# Patient Record
Sex: Male | Born: 1945 | Race: White | Hispanic: No | Marital: Married | Smoking: Never smoker
Health system: Southern US, Community
[De-identification: ages and names within clinical notes are randomized; demographics above are authoritative.]

## PROBLEM LIST (undated history)

## (undated) DIAGNOSIS — K219 Gastro-esophageal reflux disease without esophagitis: Secondary | ICD-10-CM

## (undated) DIAGNOSIS — R0600 Dyspnea, unspecified: Secondary | ICD-10-CM

## (undated) DIAGNOSIS — M1990 Unspecified osteoarthritis, unspecified site: Secondary | ICD-10-CM

## (undated) DIAGNOSIS — F419 Anxiety disorder, unspecified: Secondary | ICD-10-CM

## (undated) DIAGNOSIS — F0390 Unspecified dementia without behavioral disturbance: Secondary | ICD-10-CM

## (undated) DIAGNOSIS — L97519 Non-pressure chronic ulcer of other part of right foot with unspecified severity: Secondary | ICD-10-CM

## (undated) DIAGNOSIS — F329 Major depressive disorder, single episode, unspecified: Secondary | ICD-10-CM

## (undated) DIAGNOSIS — G40909 Epilepsy, unspecified, not intractable, without status epilepticus: Secondary | ICD-10-CM

## (undated) DIAGNOSIS — K222 Esophageal obstruction: Secondary | ICD-10-CM

## (undated) DIAGNOSIS — M459 Ankylosing spondylitis of unspecified sites in spine: Secondary | ICD-10-CM

## (undated) DIAGNOSIS — K5730 Diverticulosis of large intestine without perforation or abscess without bleeding: Secondary | ICD-10-CM

## (undated) DIAGNOSIS — I739 Peripheral vascular disease, unspecified: Secondary | ICD-10-CM

## (undated) DIAGNOSIS — M349 Systemic sclerosis, unspecified: Secondary | ICD-10-CM

## (undated) DIAGNOSIS — L97529 Non-pressure chronic ulcer of other part of left foot with unspecified severity: Secondary | ICD-10-CM

## (undated) DIAGNOSIS — K449 Diaphragmatic hernia without obstruction or gangrene: Secondary | ICD-10-CM

## (undated) DIAGNOSIS — I509 Heart failure, unspecified: Secondary | ICD-10-CM

## (undated) DIAGNOSIS — M351 Other overlap syndromes: Secondary | ICD-10-CM

## (undated) DIAGNOSIS — I7300 Raynaud's syndrome without gangrene: Secondary | ICD-10-CM

## (undated) DIAGNOSIS — F32A Depression, unspecified: Secondary | ICD-10-CM

## (undated) DIAGNOSIS — R569 Unspecified convulsions: Secondary | ICD-10-CM

## (undated) DIAGNOSIS — M341 CR(E)ST syndrome: Secondary | ICD-10-CM

## (undated) DIAGNOSIS — K3184 Gastroparesis: Secondary | ICD-10-CM

## (undated) DIAGNOSIS — J690 Pneumonitis due to inhalation of food and vomit: Secondary | ICD-10-CM

## (undated) DIAGNOSIS — H269 Unspecified cataract: Secondary | ICD-10-CM

## (undated) DIAGNOSIS — K224 Dyskinesia of esophagus: Secondary | ICD-10-CM

## (undated) HISTORY — DX: Diverticulosis of large intestine without perforation or abscess without bleeding: K57.30

## (undated) HISTORY — DX: Gastroparesis: K31.84

## (undated) HISTORY — DX: Cr(e)st syndrome: M34.1

## (undated) HISTORY — DX: Esophageal obstruction: K22.2

## (undated) HISTORY — DX: Ankylosing spondylitis of unspecified sites in spine: M45.9

## (undated) HISTORY — DX: Systemic sclerosis, unspecified: M34.9

## (undated) HISTORY — PX: TOE SURGERY: SHX1073

## (undated) HISTORY — DX: Non-pressure chronic ulcer of other part of left foot with unspecified severity: L97.529

## (undated) HISTORY — PX: WISDOM TOOTH EXTRACTION: SHX21

## (undated) HISTORY — DX: Gastro-esophageal reflux disease without esophagitis: K21.9

## (undated) HISTORY — DX: Dyskinesia of esophagus: K22.4

## (undated) HISTORY — DX: Unspecified convulsions: R56.9

## (undated) HISTORY — DX: Unspecified dementia, unspecified severity, without behavioral disturbance, psychotic disturbance, mood disturbance, and anxiety: F03.90

## (undated) HISTORY — DX: Non-pressure chronic ulcer of other part of right foot with unspecified severity: L97.519

## (undated) HISTORY — DX: Epilepsy, unspecified, not intractable, without status epilepticus: G40.909

## (undated) HISTORY — DX: Unspecified osteoarthritis, unspecified site: M19.90

## (undated) HISTORY — DX: Other overlap syndromes: M35.1

## (undated) HISTORY — DX: Unspecified cataract: H26.9

## (undated) HISTORY — PX: THUMB ARTHROSCOPY: SHX2509

## (undated) HISTORY — DX: Raynaud's syndrome without gangrene: I73.00

## (undated) HISTORY — DX: Diaphragmatic hernia without obstruction or gangrene: K44.9

---

## 1999-01-26 ENCOUNTER — Ambulatory Visit (HOSPITAL_COMMUNITY): Admission: RE | Admit: 1999-01-26 | Discharge: 1999-01-26 | Payer: Self-pay | Admitting: Orthopedic Surgery

## 2000-01-26 ENCOUNTER — Ambulatory Visit (HOSPITAL_COMMUNITY): Admission: RE | Admit: 2000-01-26 | Discharge: 2000-01-26 | Payer: Self-pay

## 2000-09-03 ENCOUNTER — Encounter: Payer: Self-pay | Admitting: Gastroenterology

## 2000-09-30 ENCOUNTER — Other Ambulatory Visit: Admission: RE | Admit: 2000-09-30 | Discharge: 2000-09-30 | Payer: Self-pay | Admitting: Gastroenterology

## 2000-09-30 ENCOUNTER — Encounter (INDEPENDENT_AMBULATORY_CARE_PROVIDER_SITE_OTHER): Payer: Self-pay

## 2000-10-15 ENCOUNTER — Encounter: Payer: Self-pay | Admitting: Gastroenterology

## 2000-11-25 ENCOUNTER — Encounter: Payer: Self-pay | Admitting: Gastroenterology

## 2003-01-29 ENCOUNTER — Encounter: Payer: Self-pay | Admitting: Emergency Medicine

## 2003-01-29 ENCOUNTER — Inpatient Hospital Stay (HOSPITAL_COMMUNITY): Admission: EM | Admit: 2003-01-29 | Discharge: 2003-02-04 | Payer: Self-pay | Admitting: Emergency Medicine

## 2003-01-30 ENCOUNTER — Encounter: Payer: Self-pay | Admitting: Neurology

## 2003-01-31 ENCOUNTER — Encounter: Payer: Self-pay | Admitting: Internal Medicine

## 2003-02-01 ENCOUNTER — Encounter: Payer: Self-pay | Admitting: Internal Medicine

## 2003-04-09 ENCOUNTER — Observation Stay (HOSPITAL_COMMUNITY): Admission: EM | Admit: 2003-04-09 | Discharge: 2003-04-10 | Payer: Self-pay | Admitting: Emergency Medicine

## 2003-04-09 ENCOUNTER — Encounter: Payer: Self-pay | Admitting: Emergency Medicine

## 2003-07-13 ENCOUNTER — Encounter: Payer: Self-pay | Admitting: Internal Medicine

## 2003-07-13 ENCOUNTER — Ambulatory Visit (HOSPITAL_COMMUNITY): Admission: RE | Admit: 2003-07-13 | Discharge: 2003-07-13 | Payer: Self-pay | Admitting: Internal Medicine

## 2003-07-21 ENCOUNTER — Ambulatory Visit (HOSPITAL_COMMUNITY): Admission: RE | Admit: 2003-07-21 | Discharge: 2003-07-21 | Payer: Self-pay | Admitting: Otolaryngology

## 2003-11-20 HISTORY — PX: NOSE SURGERY: SHX723

## 2003-11-26 ENCOUNTER — Encounter: Payer: Self-pay | Admitting: Gastroenterology

## 2004-11-23 ENCOUNTER — Ambulatory Visit: Payer: Self-pay | Admitting: Gastroenterology

## 2005-01-01 ENCOUNTER — Ambulatory Visit: Payer: Self-pay | Admitting: Gastroenterology

## 2006-03-05 ENCOUNTER — Ambulatory Visit: Payer: Self-pay | Admitting: Gastroenterology

## 2007-04-03 ENCOUNTER — Ambulatory Visit: Payer: Self-pay | Admitting: Gastroenterology

## 2007-08-05 ENCOUNTER — Emergency Department (HOSPITAL_COMMUNITY): Admission: EM | Admit: 2007-08-05 | Discharge: 2007-08-06 | Payer: Self-pay | Admitting: Emergency Medicine

## 2008-02-06 ENCOUNTER — Ambulatory Visit: Payer: Self-pay | Admitting: Gastroenterology

## 2009-03-07 DIAGNOSIS — K449 Diaphragmatic hernia without obstruction or gangrene: Secondary | ICD-10-CM | POA: Insufficient documentation

## 2009-03-07 DIAGNOSIS — K3184 Gastroparesis: Secondary | ICD-10-CM | POA: Insufficient documentation

## 2009-03-07 DIAGNOSIS — F068 Other specified mental disorders due to known physiological condition: Secondary | ICD-10-CM

## 2009-03-07 DIAGNOSIS — M349 Systemic sclerosis, unspecified: Secondary | ICD-10-CM

## 2009-03-07 DIAGNOSIS — K5730 Diverticulosis of large intestine without perforation or abscess without bleeding: Secondary | ICD-10-CM | POA: Insufficient documentation

## 2009-03-07 DIAGNOSIS — M359 Systemic involvement of connective tissue, unspecified: Secondary | ICD-10-CM | POA: Insufficient documentation

## 2009-03-07 DIAGNOSIS — K224 Dyskinesia of esophagus: Secondary | ICD-10-CM

## 2009-03-07 DIAGNOSIS — I7300 Raynaud's syndrome without gangrene: Secondary | ICD-10-CM | POA: Insufficient documentation

## 2009-03-07 DIAGNOSIS — K222 Esophageal obstruction: Secondary | ICD-10-CM

## 2009-03-08 ENCOUNTER — Ambulatory Visit: Payer: Self-pay | Admitting: Gastroenterology

## 2009-03-08 DIAGNOSIS — K219 Gastro-esophageal reflux disease without esophagitis: Secondary | ICD-10-CM | POA: Insufficient documentation

## 2009-07-14 ENCOUNTER — Ambulatory Visit (HOSPITAL_COMMUNITY): Admission: RE | Admit: 2009-07-14 | Discharge: 2009-07-14 | Payer: Self-pay | Admitting: Internal Medicine

## 2010-11-10 ENCOUNTER — Telehealth: Payer: Self-pay | Admitting: Gastroenterology

## 2010-12-01 ENCOUNTER — Ambulatory Visit
Admission: RE | Admit: 2010-12-01 | Discharge: 2010-12-01 | Payer: Self-pay | Source: Home / Self Care | Attending: Gastroenterology | Admitting: Gastroenterology

## 2010-12-21 NOTE — Progress Notes (Signed)
Summary: Medication   Phone Note Call from Patient Call back at Home Phone [phone]   Caller: Patient Call For: Dr. Jarold Motto Reason for Call: Talk to Nurse Summary of Call: Patients refill was denied because he needs an appt and he wants to discuss this with someone because it will be a while before he can come in to be seen and he will be out of medication by then Initial call taken by: [place] Johnson,  November 10, 2010 9:30 AM  Follow-up for Phone Call        pt made an appt for 12/01/2010, advised pt if he does not keep his appt we will no longer be able to refill his Regaln.  Follow-up by: Harlow Mares CMA Duncan Dull),  November 10, 2010 10:03 AM    Prescriptions: REGLAN 5 MG/ML SOLN (METOCLOPRAMIDE HCL) take 1/2 teaspoon by mouth two times a day...MUST HAVE OFFICE VISIT  #480 x 0   Entered by:   Harlow Mares CMA (AAMA)   Authorized by:   Mardella Layman MD [facility]   Signed by:   Harlow Mares CMA (AAMA) on 11/10/2010   Method used:   Electronically to        [facility] (retail)       [address]       [place], [place]  [address]       Ph: [phone]       Fax: [phone]   RxID:   [id number]

## 2010-12-21 NOTE — Assessment & Plan Note (Signed)
Summary: GASTROPARESIS follow up/lk    History of Present Illness Visit Type: Follow-up Visit Primary GI MD: Sheryn Bison MD FACP FAGA Primary Provider: Carylon Perches, MD Requesting Provider: na Chief Complaint: F/u for gastroparesis. Pt states that he is feeling better but still having problems with GERD  History of Present Illness:   Robert Carey is completely asymptomatic on his current regime of AcipHex q.a.m., p.r.n. omeprazole and Reglan. He has severe Raynaud phenomenon and chronic dementia. He denies dysphagia, hepatobiliary complaints, a lower GI problems. Is followed closely by Dr. Carylon Perches with regular labs that apparently have been normal. He denies any side effects from Reglan therapy. His appetite is good his weight is stable, and his wife relates that his dementia is stable.   GI Review of Systems    Reports acid reflux and  heartburn.      Denies abdominal pain, belching, bloating, chest pain, dysphagia with liquids, dysphagia with solids, loss of appetite, nausea, vomiting, vomiting blood, weight loss, and  weight gain.        Denies anal fissure, black tarry stools, change in bowel habit, constipation, diarrhea, diverticulosis, fecal incontinence, heme positive stool, hemorrhoids, irritable bowel syndrome, jaundice, light color stool, liver problems, rectal bleeding, and  rectal pain.    Current Medications (verified): 1)  Reglan 5 Mg/ml Soln (Metoclopramide Hcl) .... Take 1/2 Teaspoon By Mouth Two Times A Day...must Have Office Visit 2)  Aciphex 20 Mg  Tbec (Rabeprazole Sodium) .... Take 1 Each Day 30 Minutes Before Meals 3)  Prednisone 5 Mg Tabs (Prednisone) .... Once Daily 4)  Prilosec Otc 20 Mg Tbec (Omeprazole Magnesium) .... Once Daily 5)  Nifedipine Cr Osmotic 60 Mg Xr24h-Tab (Nifedipine) .... Once Daily 6)  Keppra 750 Mg Tabs (Levetiracetam) .... Take 1 1/2 Tablets Two Times A Day 7)  Calcitrate/vitamin D 315-200 Mg-Unit Tabs (Calcium Citrate-Vitamin D) .... Take 4  Caplets Daily 8)  Aspirin 81 Mg Tbec (Aspirin) .... Once Daily 9)  Multivitamins  Tabs (Multiple Vitamin) .... Once Daily 10)  Boniva 150 Mg Tabs (Ibandronate Sodium) .... Take One Tablet Monthly  Allergies (verified): 1)  ! Dilantin  Past History:  Past medical, surgical, family and social histories (including risk factors) reviewed for relevance to current acute and chronic problems.  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 Toe surgery Thumb surgery Dental implant  Family History: Reviewed history from 03/08/2009 and no changes required. Family History of Breast Cancer:Mother Family History of Pancreatic Cancer:Mother Family History of Diabetes: Mother Family History of Heart Disease: Both Parents No FH of Colon Cancer:  Social History: Reviewed history from 03/08/2009 and no changes required. Occupation: retired Patient has never smoked.  Alcohol Use - no Daily Caffeine Use -1 Illicit Drug Use - no  Vital Signs:  Patient profile:   65 year old male Height:      71 inches Weight:      155 pounds BMI:     21.70 BSA:     1.89 Pulse rate:   64 / minute Pulse rhythm:   regular BP sitting:   128 / 76  (left arm) Cuff size:   regular  Vitals Entered By: Ok Anis CMA (December 01, 2010 9:01 AM)  Physical Exam  General:  Well developed, well nourished, no acute distress.healthy appearing.  Head:  Normocephalic and atraumatic. Eyes:  PERRLA, no icterus. Lungs:  Clear throughout to auscultation. Heart:  Regular rate and rhythm; no murmurs, rubs,  or bruits. Abdomen:  Soft, nontender and nondistended. No masses,  hepatosplenomegaly or hernias noted. Normal bowel sounds. Pulses:  cyanosis of his hands with severe Raynaud's phenomenon. His hands are not painful or swollen. Neurologic:  Alert and  oriented x4;  grossly normal neurologically. Psych:  Alert and cooperative. Normal mood and affect.poor memory.     Impression & Recommendations:  Problem # 1:  GERD (ICD-530.81) Assessment Improved Continue Dexilant 60 mg q.a.m. and omeprazole 20 mg before bedtime with p.r.n. Carafate suspension and p.r.n. Reglan use. I do not think this patient needs repeat endoscopy at this time.  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 He is on nifedipine XR 60 mg a day.  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  3)  Please schedule a follow-up appointment in 1 year. 4)  The medication list was reviewed and reconciled.  All changed / newly prescribed medications were explained.  A complete medication list was provided to the patient / caregiver. 5)  Avoid foods high in acid content ( tomatoes, citrus juices, spicy foods) . Avoid eating within 3 to 4 hours of lying down or before exercising. Do not over eat; try smaller more frequent meals. Elevate head of bed four inches when sleeping.  Prescriptions: ACIPHEX 20 MG  TBEC (RABEPRAZOLE SODIUM) Take 1 each day 30 minutes before meals  #30 Each x 12   Entered by:   Harlow Mares CMA (AAMA)   Authorized by:   Mardella Layman MD [facility]   Signed by:   Harlow Mares CMA (AAMA) on 12/01/2010   Method used:   Electronically to        [facility] (retail)       [address]       [place], [place]  [address]       Ph: [phone]        Fax: [phone]   RxID:   [id number] REGLAN 5 MG/ML SOLN (METOCLOPRAMIDE HCL) take 1/2 teaspoon by mouth two times a day...MUST HAVE OFFICE VISIT  #480 x 12   Entered by:   Harlow Mares CMA (AAMA)   Authorized by:   Mardella Layman MD [facility]   Signed by:   Harlow Mares CMA (AAMA) on 12/01/2010   Method used:   Electronically to        [facility] (retail)       [address]       [place], [place]  [address]       Ph: [phone]       Fax: [phone]   RxID:   [id number]

## 2011-04-03 NOTE — Assessment & Plan Note (Signed)
[facility]                         GASTROENTEROLOGY OFFICE NOTE   Robert Carey, Yono DOSS CYBULSKI                   MRN:          [id number]  DATE:02/06/2008                            DOB:          11/18/1946    Mr. Cuartas is a 65 year old white male, retired attorney with associated  seizure disorder and progressive dementia which is followed by Dr. Doreatha Martin  at [facility].  He apparently had recent MRI at that institution.  The results are unclear at this time.   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  He has had numerous  endoscopies and dilatations.  He also has had gastrointestinal motility  evaluation at [facility] with Dr. Alycia Rossetti.  In the patient in the  past was on regular doses of Reglan and developed tremor and drowsiness,  and his dosage has gradually been decreased so that he is currently  taking 2.5 mL twice a day before breakfast and supper.  He takes Aciphex  20 mg before supper and Prilosec 20 mg in the morning.  He recurrently  denies reflux symptoms, dysphagia, nausea, vomiting, or abdominal pain.  He occasionally has some diarrhea when he eats too much fructose, but  otherwise his bowels are moving well.  He has actually gained 10 pounds  since he was last seen in May of 2008.   He sees Dr. Orson Ape for his mixed connective tissue disease and is on  prednisone 5 mg a day, aspirin 81 mg a day, calcium and multivitamins,  Nifedipine 60 mg a day for rather severe Raynaud's phenomenon, Boniva  150 mg monthly, and Keppra 1-25 twice a day.  In the past had urticaria  with Dilantin use.   PHYSICAL EXAMINATION:  The patient is awake and alert, in no acute  distress, and oriented x3, although his short term memory is very poor.  He weighs 159 pounds, with blood pressure of 124/80.  Pulse was  76 and  regular.  General physical examination was not performed.   ASSESSMENT:  1. Chronic gastroesophageal reflux disease with associated      gastroparesis and esophageal motility disorder, doing well on      prokinetic agents and rather marked chronic acid suppressive      therapy.  He certainly has no current symptoms suggestive of a  recurrent peptic stricture of his esophagus, bezoar formation, etc.  In  fact the patient is gaining weight and doing well in both his mind and  his wife's opinion.  1. Intermittent diarrhea which sounds like excessive fructose use and      periodic malabsorption.  2. Severe Raynaud's phenomenon with extremely blue, cold digits on      exam.  This is managed by Dr. Rande Lawman.  3. Negative screening colonoscopy exam completed in February 2006.  4. Chronic dementia of questionable etiology with associated seizure      disorder.   RECOMMENDATIONS:  1. Renew all medications as listed above.  2. Trial of p.r.n. Align probiotic therapy.  3. Warnings of excessive sorbitol and fructose.  4. Yearly follow up, GI-wise, unless otherwise indicated.     Vania Rea. Jarold Motto, MD, Caleen Essex, FAGA  Electronically Signed    DRP/MedQ  DD: 02/06/2008  DT: 02/06/2008  Job #: [id number]   cc:   Rande Lawman, MD  Monia Sabal, MD  Dr. Jess Barters. Ouida Sills, MD

## 2011-04-06 NOTE — Discharge Summary (Signed)
NAME:  Robert Carey, Robert Carey                      ACCOUNT NO.:  [id number]   MEDICAL RECORD NO.:  [id number]                   PATIENT TYPE:  INP   LOCATION:  IC10                                 FACILITY:  [facility]   PHYSICIAN:  Kingsley Callander. Ouida Sills, M.D.                  DATE OF BIRTH:  1946-08-14   DATE OF ADMISSION:  01/29/2003  DATE OF DISCHARGE:  02/04/2003                                 DISCHARGE SUMMARY   DISCHARGE DIAGNOSES:  1. Complex partial seizures.  2. Mixed connective tissue disease.  3. Small vessel ischemic changes by MRI.  4. Papular back rash likely due to Unasyn.   PROCEDURES:  1. Head CT x2.  2. MRI of the brain.  3. EEG x2.  4. Lumbar puncture.  5. Echocardiogram.  6. Carotid duplex scan.   HOSPITAL COURSE:  This patient is a 65 year old white male who presented to  the emergency room with a change in mental status.  His thinking had been  unclear.  He exhibited marked memory deficits.  There had been no witnessed  seizures.  He had not experienced any known head trauma.  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  He was  hospitalized and Robert Carey, M.D. from neurology was consulted.  The  next day he experienced a generalized tonic-clonic type seizure.  A repeat  CT scan with contrast revealed no change.  He was again felt to have a  subacute infarct.  Following his seizure episode he developed fever.  Blood  cultures were negative.  His chest x-ray revealed no definite evidence of  aspiration.  He was treated with IV Unasyn.  He was treated with IV Dilantin  following his seizure and transferred to the intensive care unit.  His  Dilantin level was 16 following his initial therapy.   He underwent a lumbar puncture by Robert Carey, M.D.  Had an opening  pressure 17 cm water.  His protein was 104, glucose 86.  No cells were  identified.  His cryptococcal antigen was negative.  His herpes simplex PCR  was  negative.  His culture has been negative.   He underwent an EEG which showed frequent epileptiform discharges in the  right temporal region.  He was started on IV Solu-Medrol in light of his  history of connective tissue disease.  Robert Carey, M.D. discussed this  with Robert Carey, M.D., his rheumatologist in [place].   He experienced multiple complex partial seizure type episodes with lip  smacking and staring.  His fevers resolved.  He developed a reddened papular  rash on his back which was felt to likely be related to Unasyn.  Unasyn was  stopped.   He underwent an MRI of the brain which revealed small vessel ischemic  changes with no evidence of infarct.  Depakote was added.  His carotid  ultrasound revealed no evidence of hemodynamically significant stenosis.  His echocardiogram revealed no evidence of thrombus.   By March 17 he continued to show signs of memory deficits with intermittent  seizure activity despite a Dilantin level of 17.2 and a valproic acid of  40.6.  He was given additional boluses of valproic acid.  On March 18 he had  approximately 19 episodes documented by the nursing staff throughout the  day.  His valproic acid level was boosted to 68 with additional boluses.   Due to his recurrent seizure activity, arrangements have been made for  transfer to [facility] for further neurology evaluation and treatment.   DISCHARGE MEDICATIONS:  1. Depakote 500 mg t.i.d.  2. Dilantin 400 mg daily.  3. Solu-Medrol 40 mg IV q.8h.   Other laboratory studies had revealed a sedimentation rate of 65.  His white  counts were consistently normal.                                               Kingsley Callander. Ouida Sills, M.D.    ROF/MEDQ  D:  02/04/2003  T:  02/05/2003  Job:  [id number]   cc:   Darleen Crocker A. Gerilyn Carey, M.D.  [address], [place]  [place]  [place] [address]  Fax: [phone]

## 2011-04-06 NOTE — Group Therapy Note (Signed)
   NAME:  Robert Carey, Robert Carey                      ACCOUNT NO.:  [id number]   MEDICAL RECORD NO.:  [id number]                   PATIENT TYPE:  INP   LOCATION:  IC10                                 FACILITY:  [facility]   PHYSICIAN:  [name], M.D.              DATE OF BIRTH:  22-Jan-1946   DATE OF PROCEDURE:  DATE OF DISCHARGE:                                   PROGRESS NOTE   SUBJECTIVE:  The patient had a witnessed generalized tonic-clonic seizure.  Today, he had some breathing difficulties during and after this.  Before the  seizure, he has continued to have significant short-term memory impairment.  He does not complain of any headaches.   OBJECTIVE:  VITAL SIGNS:  Temperature maximum 101.5 at 3-4 hours after his  seizure, blood pressure 140/90, pulse 83, respirations 15.  GENERAL:  The patient lays in bed with eyes closed. He opens his eyes to  light sternal rub.  He talks in simple sentences, reports that he is drowsy  but denies having headaches.  HEENT: __________ bilaterally.  Pupils are reactive.  Extraocular movements  are full.  NECK:  Seems somewhat stiff.   CT scan was reviewed in person. Once again, there is a hypodensity seen in  the left parietal region. It is unchanged from the prior scan done  yesterday. This scan today is done with IV contrast, however, there is no  enhancement seen.   ASSESSMENT/PLAN:  Continued encephalopathy now with fever and seizures.  Concerns are raised for potential encephalitis or meningitis.   Given the CT scan does not show an enhancement lesion, I think the chances  of a high grade malignancy or abscess are very low.  Potential differential  includes a subacute infarct several months ago, now presenting with seizures  causing alteration of consciousness. However, given the fever, I am  concerned with potential encephalitis or meningitis, particularly chronic  meningitis from Cryptococcosis and also possible herpes meningitis.   We agree with the current Dilantin that has been started. He is to have an  EEG done this evening or tomorrow morning. Will check a Dilantin level, will  continue with the Dilantin maintenance dose. Given the fever, I think we  ought to access his spinal fluid to provide for any evidence of meningitis,  encephalitis.                                                [name], M.D.    KAD/MEDQ  D:  01/30/2003  T:  01/30/2003  Job:  [id number]

## 2011-04-06 NOTE — Discharge Summary (Signed)
   NAME:  Robert Carey, Robert Carey NO.:  [id number]   MEDICAL RECORD NO.:  [id number]                   PATIENT TYPE:  OBV   LOCATION:  A341                                 FACILITY:  [facility]   PHYSICIAN:  Kingsley Callander. Ouida Sills, M.D.                  DATE OF BIRTH:  09-24-1946   DATE OF ADMISSION:  04/09/2003  DATE OF DISCHARGE:  04/10/2003                                 DISCHARGE SUMMARY   DISCHARGE DIAGNOSES:  1. Seizure.  2. History of postviral vasculitis.  3. Mixed connective tissue disease.  4. Raynaud's phenomena.   HOSPITAL COURSE:  This patient is a 65 year old white male who presented  with a seizure at home.  He had undergone an EEG at [facility] that morning. He has been continued on Keppra and Depakote for  seizure prevention.  His valproic acid level was 66.  His sodium 132,  potassium 4.7, calcium is 8.5.  White count 4.4.  There was no fever.  His  had a postictal period and then returned to his baseline mental status. He  was hospitalized overnight for observation. He had frequent neurologic  checks. He remained stable and was felt to be in satisfactory condition for  discharge on the morning of 05/22.  He will follow up with Dr. Luellen Pucker in the  neurology department at [facility]   DISCHARGE MEDICATIONS:  1. Keppra 750 mg b.i.d.  2. Depakote 1000 mg t.i.d.  3. Aspirin 81 mg daily.  4. Multivitamin daily.  5. Procardia XL 60 mg daily  6. Prednisone 5 mg daily.  7. Tolectin 400 mg b.i.d.  8. Vicodin q.4h. p.r.n.  9. Protonix 40 mg daily.  10.      Caltrate q.i.d.  11.      Zocor 20 mg daily.                                               Kingsley Callander. Ouida Sills, M.D.    ROF/MEDQ  D:  04/10/2003  T:  04/10/2003  Job:  [id number]

## 2011-04-06 NOTE — Group Therapy Note (Signed)
   NAME:  Robert Carey, Robert Carey                      ACCOUNT NO.:  [id number]   MEDICAL RECORD NO.:  [id number]                   PATIENT TYPE:  INP   LOCATION:  IC10                                 FACILITY:  [facility]   PHYSICIAN:  [name], M.D.              DATE OF BIRTH:  [birth year]   DATE OF PROCEDURE:  02/02/2003  DATE OF DISCHARGE:                                   PROGRESS NOTE   SUBJECTIVE:  The patient has had a better day today, was initially somewhat  drowsy this morning but was up sitting and conversing today.  He still has  had three or four brief staring spells but these are a lot less than prior  events yesterday.  Apparently they were not as long.   OBJECTIVE:  He has been afebrile today (T-max 99.2), blood pressure 133/81,  pulse 86, respirations 18.  He is awake and alert and converses.  He  actually did have one of his spells while I was present in the room.  The  nurses report that he seemed to have these spells when he is trying to  urinate.  He developed the acute onset of staring with chewing-like  movements of his mouth.  Entire event lasted about 1 minute.  His EEG has  normalized today which I think is a good sign that there was no epileptiform  discharges seen.  His Dilantin level has dropped to 13.  Sed rate 65.   ASSESSMENT AND PLAN:  Melton Krebs with complex partial seizures that seemed to  have improved.  His EEGs have also improved but he continues to have these  smaller type of complex partial seizures.  We are therefore going to give  him additional Dilantin and we will increase the maintenance dose to about  400-450 a day.  That is to be given IV 1 mL q.8h.  We will also give him a  bolus of 500 of Dilantin.  Depakote has been started.  We will check his  level in the morning.  We will also check a Dilantin level.                                               [name], M.D.    KAD/MEDQ  D:  02/02/2003  T:  02/02/2003  Job:  [id number]

## 2011-04-06 NOTE — Consult Note (Signed)
NAME:  Robert Carey, Robert Carey                      ACCOUNT NO.:  [id number]   MEDICAL RECORD NO.:  [id number]                   PATIENT TYPE:  INP   LOCATION:  A202                                 FACILITY:  [facility]   PHYSICIAN:  [name], M.D.              DATE OF BIRTH:  February 04, 1946   DATE OF CONSULTATION:  DATE OF DISCHARGE:                                   CONSULTATION   NEUROLOGICAL CONSULTATION:   IMPRESSION:  The patient's presentation and imaging seems to suggest an  infectious process or possibly even a tumor.  Statistically speaking,  however, an acute infarct is most likely.   RECOMMENDATIONS:  As an MRI is not going to be available until Monday, I  would suggest that a CT scan of the brain with contrast be carried out.  Additional recommendations include electroencephalography and q.4h.  neurologic checks.  We may consider giving him a stress dose of steroids as  he is on chronic steroid medications.  Other suggestions include an  echocardiography.   HISTORY OF PRESENT ILLNESS:  This is a 65 year old right-handed Caucasian  male who is a [employer] fast [employer].  He probably has not felt well this past week.  The family reports that he has had upper respiratory symptoms.  He  apparently has not had  a good appetite all week long.  He was otherwise  unremarkable without cognitive impairment and had been functioning well.  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  At work today, he was noted to be much quieter than usual.  On  questioning his colleagues noted that he had marked memory impairment.  He  was subsequently taken to the hospital for further evaluation where a CT  scan apparently showed evidence of a left parietal infarct.   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  There is no  history of hypertension, diabetes, or heart disease.   MEDICATIONS:  The admission medications include prednisone.  He also is  listed on Procardia XL 60 mg once a day, Tolectin DS 40 mg t.i.d.   FAMILY HISTORY:  Significant with a mother with heart disease.  There is a  history of diabetes.   REVIEW OF SYSTEMS:  Review of systems are as stated above.  No reports of  headaches or fevers.   PHYSICAL EXAMINATION:  GENERAL:  Physical examination showed a thin  gentleman who is drowsy but easily arousable.  He appears about 10 years  older than his stated age.  NECK:  Neck is supple.  VITAL SIGNS:  Blood pressure 143/76, pulse 91, respirations 20, temperature  97.8.  NEUROLOGICAL:  The patient, as stated before, is drowsy but arouses easily.  He is slightly uncooperative with the evaluation.  He has marked word  finding difficulties and difficulty with  memory.  Naming was relatively  intact.  Comprehension is fine.  He has somewhat also slightly reduced  fluency.  No clear neglecta seen.  He is essentially oriented x1.  Apparently, he has been oriented x3 before.  He had difficulty stating the  name of the president, which city he is in.  Cranial nerve evaluation with  pupils full, and briskly reactive.  Visual fields are intact.  Extraocular  muscles full.  Facial muscle strength is normal and symmetric.  Tongue is  midline.  Uvula is midline.  He also has normal motor examination that shows  normal tone, bulk and strength.  There is no pronator drift.  Five finger  movements are normal.  Foot tap is also normal.  Reflexes are +2.  Plantars  are downgoing.  Sensory examination is normal to temperature and light  touch.  Coordination is unremarkable.  Gait is normal.   LABORATORY DATA:  Review of the head CT scan shows subtle hypodensities  involved in the parietal region of the left.  This hypodensity, however, is  in the subcortical region without mass effect.  The gray matter in the  surrounding region appears to be relatively  spare.                                               [name], M.D.    KAD/MEDQ  D:  01/29/2003  T:  01/30/2003  Job:  [id number]

## 2011-04-06 NOTE — Group Therapy Note (Signed)
   NAME:  Robert Carey, SOUTHERS NO.:  [id number]   MEDICAL RECORD NO.:  [id number]                  PATIENT TYPE:   LOCATION:                                       FACILITY:   PHYSICIAN:  [name], M.D.              DATE OF BIRTH:   DATE OF PROCEDURE:  DATE OF DISCHARGE:                                   PROGRESS NOTE   SUBJECTIVE:  The patient apparently is more lucid and responsive today.  He,  however, had a seizure at about 12 midnight, early this morning, and he was  given a dose of Ativan.  He apparently had a spiking temperature today.   OBJECTIVE:  The T-max is 103.2.  He had a temperature before of 101.  His  blood pressure is 120/73, pulse of 83.  Respirations 20-24.  He opens his  eyes to verbal command and follows commands.  Pupils are 5 mm and briskly  active.  Extraocular movements are full.  He is able to state that he is in  the hospital at [facility].  He knows the year.  He knows that he has 2  daughters and knows that it is Sunday.  He was also able to take pieces of  ice.  His __________ results indicates that the WBC and RBC are both 0,  glucose 86, protein 109, gram stain negative.  CSF culture and blood  cultures are also pending at this time. Dilantin level this morning was 14,  and yesterday, before the seizure was 16.  EEG showed that he had frequent  epileptiform discharges in the right temporal region T4 during photic  stimulation. No clear ongoing electro __________ seizures; however.   ASSESSMENT AND PLAN:  An encephalopathy with recurrent fevers and seizure.  The family reports that he has had unexplained fevers in the past that were  worked up and were unremarkable.  He does have a baseline history of mixed  connective tissue disease which may be playing a role.  Given his recurrent  seizure we will give him another bolus of Dilantin 300 mg and also increase  and maintain his dose to 125 mg q.8h.  We may consider a  stress dose of high dose Solu-Medrol given that he is on  chronic steroids.  His MRI is scheduled for tomorrow. We will also order  some other blood testing including TSH, B12, and RPR.                                               [name], M.D.    KAD/MEDQ  D:  01/31/2003  T:  01/31/2003  Job:  [id number]

## 2011-04-06 NOTE — H&P (Signed)
NAME:  Robert Carey, Robert Carey NO.:  [id number]   MEDICAL RECORD NO.:  [id number]                   PATIENT TYPE:  INP   LOCATION:  A202                                 FACILITY:  [facility]   PHYSICIAN:  Kingsley Callander. Ouida Sills, M.D.                  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  His wife, daughter, and sister are with him.  He has exhibited  unclear thinking patterns today. He was unable to remember having gone home  from work for lunch. He had difficulty remembering the President.  He was  unclear on recent events. There had been no head trauma, fever, weakness or  change in speech.  He generally had not quite felt himself since a  gastroenteritis episode a week ago.  He was noted to not be interacting with  his office staff as usual this afternoon. He was much more quiet than usual.  He has no past history of neurologic problems.   PAST MEDICAL HISTORY:  1. Ankylosing spondylitis and mixed connective tissue disease.  2. Osteoporosis.  3. Toe surgery.  4. Esophageal stricture dilatations.   MEDICATIONS:  1. Prednisone 5 mg daily.  2. Procardia XL 60 mg daily.  3. Tolectin DS 400 mg t.i.d.  4. Citrucel q.i.d.   ALLERGIES:  None.   ALLERGIES:  He does not smoke cigarettes or drink alcohol.   FAMILY HISTORY:  His father had coronary heart disease and chronic lung  disease.  His mother has had coronary heart disease.  A sister has melanoma.   REVIEW OF SYSTEMS:  No headache, fever, chest pain, difficulty in breathing,  abdominal pain, change in bowel habits, or difficulty voiding.  He has  Raynaud phenomenon, and has chronic arthritic complaints.   PHYSICAL EXAMINATION:  VITAL SIGNS:  Temperature 97.8,  pulse 78,  respirations 18, blood pressure 145/78.  MENTAL STATUS:  Alert and oriented day of the week, but not the day of the  month.  He is oriented to the year, but has difficulty explaining the  situation here.  HEENT:  Fundi appear normal.  Pharynx is unremarkable.  NECK:  There is no JVD or thyromegaly.  No carotid bruits.  LUNGS:  Clear.  HEART:  Regular with no murmurs.  ABDOMEN:  Nontender with no hepatosplenomegaly.  BACK:  He has some scoliosis and straightening of the spine.  He has  multiple seborrheic keratoses on his back.  EXTREMITIES:  He has purplish discoloration of his hands from Raynaud.  There are no other cyanosis, clubbing, or edema.  NEUROLOGIC:  He is alert. His speech is normal.  His calculations are  normal.  He had some difficulty remembering his phone number. He had  difficulty naming any Presidents prior to [employer] except for [employer].  He is  able to state the months of the year backward and spell world backward.  He has no arm or leg weakness.  He has withdrawal responses with plantar  testing. He has brisk reflexes.  Strength is full.   LABORATORY DATA:  White count 6000, hematocrit 40.8, platelets 248.  Sodium  133, potassium 4.3, glucose 154, BUN 17, creatinine 1.0, calcium 9.4.  His  chest x-ray is negative.  Head CT reveals a subacute infarct in the left  parietal lobe.   IMPRESSION:  1. Left parietal stroke.  The precise time of onset is uncertain.  This     would make him a poor candidate for thrombolytic therapy.  He will be     hospitalized and treated with antiplatelet therapy with aspirin and     Plavix.  Will obtain a neurology consultation with Dr. Gerilyn Pilgrim.  An MRI     of the brain will be obtained.  2. Ankylosing spondylitis/mixed connective tissue disease.  Continue his     doses of prednisone and Tolectin.                                               Kingsley Callander. Ouida Sills, M.D.    ROF/MEDQ  D:  01/29/2003  T:  01/29/2003  Job:  [id number]    cc:   Darleen Crocker A. Gerilyn Pilgrim, M.D.  [address], [place]  [place]  [place] [address]  Fax: [phone]

## 2011-04-06 NOTE — Procedures (Signed)
   NAME:  FATE, CASTER NO.:  [id number]   MEDICAL RECORD NO.:  [id number]                  PATIENT TYPE:   LOCATION:  ICU10                                FACILITY:   PHYSICIAN:  [name], M.D.              DATE OF BIRTH:   DATE OF PROCEDURE:  DATE OF DISCHARGE:                                EEG INTERPRETATION   HISTORY:  This is a 65 year old who presents with altered mental status and  seizures.   ANALYSIS:  This is a 16 channel recording which is conducted for  approximately 20 minutes.  There is a Beta posterior rhythm of approximately  20-21 Hz.  The recording seems to be somewhat replete with Beta activity.  There is faster Beta activity in the frontal fields.  Awake and drowsy  activities are seen.  During photic stimulation there is rhythmic delta  activity seen initially starting in the frontal but involves over the entire  hemisphere bilaterally.  During the middle part of photic stimulation there  is seen phase reversal at T4 occurring at a rate of about 1-1/2 per Hz  during photic stimulation.  During this time the patient apparently did not  respond to verbal commands.  This spell was __________ throughout photic  stimulation.  Both the global rhythmic 2-3 Hz, delta slowing; and the phase  reversal seen at T4, stopped abruptly after photic stimulation without any  slowing afterwards.  Essentially the baseline returned to normal afterwards.  No hyperventilation was conducted.   IMPRESSION:  This is an abnormal recording due to epileptiform activity seen  in the right temporal area induced by photic stimulation.                                               [name], M.D.    KAD/MEDQ  D:  01/31/2003  T:  01/31/2003  Job:  [id number]

## 2011-04-06 NOTE — Op Note (Signed)
   NAME:  Robert Carey, Robert Carey                      ACCOUNT NO.:  [id number]   MEDICAL RECORD NO.:  [id number]                   PATIENT TYPE:  OIB   LOCATION:  2899                                 FACILITY:  [facility]   PHYSICIAN:  Jefry H. Pollyann Kennedy, M.D.                DATE OF BIRTH:  12-29-1945   DATE OF PROCEDURE:  07/21/2003  DATE OF DISCHARGE:                                 OPERATIVE REPORT   PREOPERATIVE DIAGNOSIS:  Nasal fracture with displacement.   POSTOPERATIVE DIAGNOSIS:  Nasal fracture with displacement.   PROCEDURE:  Closed reduction of nasal fracture with stabilization.   SURGEON:  Jefry H. Pollyann Kennedy, M.D.   Local anesthesia was used with intravenous sedation.  The patient tolerated  the procedure well and was transferred to recovery in stable condition.   HISTORY:  He is a 65 year old gentleman who fell and hit his nose about a  week ago and has a severe displacement of the nasal bones toward the right  side.  The risks, benefits, alternatives, and complications of the procedure  were explained to the patient and his wife, who seemed to understand and  agreed to surgery.   DESCRIPTION OF PROCEDURE:  The patient was taken to the operating room and  placed on the operating table in supine position.  Following induction of  intravenous sedation, the nasal cavities were sprayed with Afrin and  lidocaine with phenylephrine.  Xylocaine 1% with epinephrine was infiltrated  into the lateral nasal walls, the nasal dorsum, the septum, and the middle  and inferior turbinates.  After adequate anesthesia was achieved, a nasal  elevator was used to elevate the left nasal bone and manual digital  manipulation was used to reduce the right nasal bone fracture with pressure  toward the left.  There was excellent reduction with good midline position  and good stability.  The left nasal cavity was temporarily packed with an  Afrin-soaked pledget to provide hemostasis.  The nasal cavities and  nasopharynx were suctioned of blood and secretions.  The nasal dorsum was  dressed with Benzoin and Steri-Strips and an Aquaplast splint.  The patient  was then transferred to recovery in stable condition for discharge.                                               Jefry H. Pollyann Kennedy, M.D.    JHR/MEDQ  D:  07/21/2003  T:  07/21/2003  Job:  [id number]

## 2011-04-06 NOTE — H&P (Signed)
NAME:  Robert Carey, Robert Carey NO.:  [id number]   MEDICAL RECORD NO.:  [id number]                   PATIENT TYPE:  OBV   LOCATION:  A341                                 FACILITY:  [facility]   PHYSICIAN:  Kingsley Callander. Ouida Sills, M.D.                  DATE OF BIRTH:  09/18/1946   DATE OF ADMISSION:  04/09/2003  DATE OF DISCHARGE:                                HISTORY & PHYSICAL   CHIEF COMPLAINT:  Seizure.   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 He had fallen from the bed.  His wife had heard him and  found him with generalized seizing.  This subsequently subsided and he was  postictal for over 30 minutes before coming around.  He had been awake the  night before for a sleep deprived EEG at [facility]. He  had undergone this procedure at [facility] and then had returned home and had  gone to bed.  The EEG has been performed to see if he needs ongoing seizure  treatment with Keppra and Depakote.  He had been hospitalized with what was  felt to be a postviral vasculitis back in March.  He had been hospitalized  here at [facility] and then transferred to [facility].  He has had  extensive evaluation and treatment through [facility].  He had previously  experienced a tonic-clonic seizure here at [facility], but then also  complete partial seizures which were extremely difficult to control.   PAST MEDICAL HISTORY:  1. Mixed connective tissue disease.  2. Small vessel disease by MRI.  3. Osteoporosis.  4. Esophageal stricture dilatations.  5. Toe surgery.   MEDICATIONS:  1. Aspirin 81 mg daily,  2. Multivitamin daily.  3. Procardia XL 60 mg daily.  4. Prednisone 5 mg daily.  5. Tolectin 400 mg b.i.d.  6. Protonix 40 mg daily.  7. Keppra 750 mg b.i.d.  8. Depakote 1000 mg t.i.d.  9. Caltrate q.i.d.  10.      Zocor 20  daily.   ALLERGIES:  Dilantin.   PAST SURGICAL HISTORY:  He does not smoke cigarettes, drink alcohol, or use  recreational drugs.   FAMILY HISTORY:  His father had coronary artery disease and chronic lung  disease.  His mother had coronary artery disease.  His sister has had a  melanoma.   REVIEW OF SYSTEMS:  He has had no recent witnessed seizures.  He has  suffered from memory deficits.  He has had stable joint symptoms, although  after standing in the emergency room he began to experience back pain.   PHYSICAL EXAMINATION:  VITAL SIGNS:  Temperature 97.8, blood pressure  128/80, pulse 78, respirations 20.  Weight 154.  GENERAL:  Alert, oriented, white male.  HEAD AND NECK:  His  oropharynx reveals no evidence of tongue biting.  His  extraocular movements are intact.  Pupils are equal, round, and reactive to  light.  There is no evidence of head or neck trauma.  There is a slight left  ptosis.  LUNGS:  Clear.  HEART:  Regular with no murmurs.  ABDOMEN:  Nontender with no hepatosplenomegaly.  BACK:  Spine is nontender.  He has had some tenderness in his paralumbar and  parathoracic muscles.  EXTREMITIES:  He has cool, red fingers typical of Raynaud.  No edema.  NEUROLOGIC:  He has little recall of the day's events.  He has stable  strength in his extremities and is able to walk independently.   LABORATORY DATA:  White count 4.4, hemoglobin 12.5, platelets 125.  Sodium  132, potassium 4.7, glucose 100, BUN 21, creatinine 1.2, calcium 8.5.  Valproic acid level 66.  Thoracic spine x-ray reveals no fracture.   IMPRESSION:  1. Seizure. He is being hospitalized for observation and frequent neuro     checks.  2. Recent postviral vasculitis.  3. Mixed connective tissue disease.  4. Raynaud's phenomenon.                                               Kingsley Callander. Ouida Sills, M.D.    ROF/MEDQ  D:  04/10/2003  T:  04/10/2003  Job:  [id number]

## 2011-04-06 NOTE — Op Note (Signed)
   NAME:  Robert Carey, Robert Carey                      ACCOUNT NO.:  [id number]   MEDICAL RECORD NO.:  [id number]                   PATIENT TYPE:  INP   LOCATION:  IC10                                 FACILITY:  [facility]   PHYSICIAN:  [name], M.D.              DATE OF BIRTH:  February 13, 1946   DATE OF PROCEDURE:  DATE OF DISCHARGE:                                  PROCEDURE NOTE   PROCEDURE:  Lumbar spinal tap.   SURGEON:  [name], M.D.   INDICATIONS:  This is a 65 year old gentleman who presents with altered  mental status, fever, seizures, memory loss. The procedure is being done to  evaluate for possible meningitis or encephalitis.   DESCRIPTION OF PROCEDURE:  Informed consent was obtained from the wife.  Risks and benefits of procedure were discussed. The patient was prepped and  draped in the usual fashion in the fetal position. The L4-L5 interspace was  accessed during a single pass. Opening pressure was 17 cmH20, fluid is  clear.  The specimen was sent for routine analysis. Additionally, it was sent for  fungal culture, Cryptococcal antigen and herpes PCR.  The patient tolerated  the procedure well.                                               [name], M.D.    KAD/MEDQ  D:  01/30/2003  T:  01/30/2003  Job:  [id number]

## 2011-04-06 NOTE — Procedures (Signed)
   NAME:  MALEIK, VANDERZEE                      ACCOUNT NO.:  [id number]   MEDICAL RECORD NO.:  [id number]                   PATIENT TYPE:  INP   LOCATION:  IC10                                 FACILITY:  [facility]   PHYSICIAN:  [name], M.D.              DATE OF BIRTH:  [birth year]   DATE OF PROCEDURE:  02/02/2003  DATE OF DISCHARGE:                                EEG INTERPRETATION   NEUROLOGIST:  [name], M.D.   HISTORY:  This is a 65 year old patient has been having episodic alteration  of consciousness and generalized seizures.   ANALYSIS:  A 6-lead channel recording is conducted for approximately 20  minutes.  There is posterior rhythm of 10-10.5 Hz.  There is beta activity  seen in the frontal areas.  Awake and sleep architectures are seen.  Sleep  spindles with K complexes are observed, indicating stage 2 sleep.  On the  prior recording, there was epileptiform phase reversal seen in the right  temporal region.  No epileptiform discharges are seen during this recording.  There is no focal slowing or lateralized slowing.   IMPRESSION:  Normalized electroencephalography.  There are no epileptiform  discharges seen.                                               [name], M.D.    KAD/MEDQ  D:  02/02/2003  T:  02/02/2003  Job:  [id number]

## 2011-04-06 NOTE — Group Therapy Note (Signed)
NAME:  Robert Carey, Robert Carey                      ACCOUNT NO.:  [id number]   MEDICAL RECORD NO.:  [id number]                   PATIENT TYPE:  INP   LOCATION:  IC10                                 FACILITY:  [facility]   PHYSICIAN:  [name], M.D.              DATE OF BIRTH:  12-19-1945   DATE OF PROCEDURE:  02/01/2003  DATE OF DISCHARGE:                                   PROGRESS NOTE   SUBJECTIVE:  The patient actually did very well this morning.  He  essentially was back to himself.  His family reports that he was talking and  joking and had normal conversations.  He continued to have short-term  memory, however,  He had two abrupt spells characterized by staring, looking  off to the left, becoming agitated and confused.  These spells were  associated with some drowsiness afterwards.   OBJECTIVE:  He has remained afebrile today, temperature 99.4, pulse 92,  respirations 20, and blood pressure 126/77.  Tonight, he is actually much  more awake and conversational.  He is oriented x 3, follows commands  briskly.   Review of his MRI does not show any discrete infarcts, particularly there is  no discrete cortical infarction.  There is no evidence of acute infarct on  diffusion weighted sequence.  What he has is marked small white matter  periventricular and subcortical hyperintensity in T2 an 4 sequences.  They  are, I counted about 30 to 35 bilaterally.  There seem to be more of these  hyperintensities in the periventricular area posteriorly, particularly on  the left side, which is most likely the area which showed has a hyperdensity  on CT scan.   His echocardiography was essentially unremarkable.  Doppler negative.   Lipid profile shows cholesterol 125, triglycerides 55, HDL 47, LDL 67.  TSH  0.74.  Vitamin B12 level 325.  RPR nonreactive.   The iron level today is 15 despite increased dose.   ASSESSMENT/PLAN:  Acute confusional state with episodic confusion and times  of  lucency.  I believe these confusional bouts are most likely partial  seizures with secondary generalization twice.  We will start a second  epileptic medication.  I think we will use Depakote.  We will give him a  bolus of 500 now and another 500 in the morning.  We will also start  maintenance dose of 250 mg t.i.d. of the Depakote.  I did have a discussion  with his rheumatologist.  Apparently he has been on long-term steroids at 5  mg for two to three years.  We will, therefore, give him a stress dose of  Solu-Medrol.  I suspect that many of these white matter lesions in his brain  may be related to his baseline connective tissue disease.  I do not get the  sense that these are primarily ischemic nor is there any evidence of  infectious process as his CSF does  not show any white cells.  If his  seizures do not improve by tomorrow, I think we should consider tertiary  care for continuous EEG and more aggressive treatment.                                               [name], M.D.    KAD/MEDQ  D:  02/01/2003  T:  02/01/2003  Job:  [id number]

## 2011-08-30 LAB — DIFFERENTIAL
Basophils Relative: 1
Eosinophils Absolute: 0
Eosinophils Relative: 0
Lymphs Abs: 1
Monocytes Absolute: 0.7
Monocytes Relative: 10

## 2011-08-30 LAB — CBC
Hemoglobin: 14.6
MCHC: 33.9
RBC: 4.85
RDW: 13.5

## 2011-08-30 LAB — COMPREHENSIVE METABOLIC PANEL
ALT: 15
AST: 31
Alkaline Phosphatase: 70
Calcium: 9.5
GFR calc Af Amer: >60
Potassium: 4.2
Sodium: 138
Total Protein: 8.7 — ABNORMAL HIGH

## 2011-08-30 LAB — POCT CARDIAC MARKERS
CKMB, poc: 1.5
CKMB, poc: 2.6
Myoglobin, poc: 66
Myoglobin, poc: 90.6
Operator id: 166561
Troponin i, poc: <0.05
Troponin i, poc: <0.05

## 2011-12-07 ENCOUNTER — Other Ambulatory Visit: Payer: Self-pay | Admitting: Gastroenterology

## 2012-01-07 ENCOUNTER — Other Ambulatory Visit: Payer: Self-pay | Admitting: Gastroenterology

## 2012-01-07 MED ORDER — METOCLOPRAMIDE HCL 5 MG/5ML PO SOLN: ORAL | Status: DC

## 2012-01-07 NOTE — Telephone Encounter (Signed)
rxs sent pt aware

## 2012-01-21 ENCOUNTER — Encounter: Payer: Self-pay | Admitting: *Deleted

## 2012-01-22 ENCOUNTER — Ambulatory Visit (INDEPENDENT_AMBULATORY_CARE_PROVIDER_SITE_OTHER): Payer: Medicare HMO | Admitting: Gastroenterology

## 2012-01-22 ENCOUNTER — Encounter: Payer: Self-pay | Admitting: Gastroenterology

## 2012-01-22 VITALS — BP 114/74 | HR 88 | Ht 71.0 in | Wt 154.5 lb

## 2012-01-22 DIAGNOSIS — K224 Dyskinesia of esophagus: Secondary | ICD-10-CM

## 2012-01-22 DIAGNOSIS — K219 Gastro-esophageal reflux disease without esophagitis: Secondary | ICD-10-CM

## 2012-01-22 DIAGNOSIS — K5730 Diverticulosis of large intestine without perforation or abscess without bleeding: Secondary | ICD-10-CM

## 2012-01-22 MED ORDER — METOCLOPRAMIDE HCL 5 MG/5ML PO SOLN: ORAL | Status: DC

## 2012-01-22 MED ORDER — RABEPRAZOLE SODIUM 20 MG PO TBEC: 20.0000 mg | DELAYED_RELEASE_TABLET | Freq: Every day | ORAL | Status: DC

## 2012-01-22 NOTE — Patient Instructions (Signed)
Your prescription(s) have been sent to you pharmacy.  Follow up in one year.   

## 2012-01-22 NOTE — Progress Notes (Signed)
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 He has Crest syndrome with severe Raynaud's phenomenon. He also has an associated esophageal motility disorder with chronic acid reflux managed with AcipHex 20 mg every morning, Prilosec 20 mg at bedtime, with liquid metoclopramide half a teaspoon,5mg , twice a day. He has not had any neurological problems with Reglan. His wife is with him today and reports chronic early satiety but no true dysphagia with significant acid reflux symptoms at this time. Also there is no history of constipation, melena, hematochezia, or hepatobiliary problems. He recently has had some edema and swelling and pain in his left hand responsive to local steroid injections at rheumatology. Apparently, there is some question about gouty arthritis.  Current Medications, Allergies, Past Medical History, Past Surgical History, Family History and Social History were reviewed in [employer] record.  Pertinent Review of Systems Negative.... patient's wife relates that he has blood work yearly at [facility]. The patient denies current cardiovascular, pulmonary, genitourinary problems.   Physical Exam: Healthy patient with blood pressure 114/74 pulse 88 and regular. BMI is normal at 21.5. Patient has severe ptosis of his hands with very cold digits. His hands are not swollen or tender at this time. His chest is generally clear and he appeared to be in a regular rhythm without murmurs gallops or rubs. There is no organomegaly, abdominal masses or tenderness. Bowel sounds are normal, and I cannot appreciate succussion splash. Mental status is normal. He is a very pleasant and affable personality. His wife is present throughout the interview and exam.    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 The patient and his wife are extremely happy with his  current state of control of his acid reflux symptoms, and do not wish to change to a prokinetic like domperidone. Have again reviewed the black box warning about metoclopramide, they agreed to let us know if he has any problems, and I therefore refilled all of his medications including his PPIs. We will see him yearly on a when necessary basis as needed. See no need for endoscopy unless he develops dysphagia or fails to respond to medical therapy. Encounter Diagnoses  Name Primary?  . GERD (gastroesophageal reflux disease) Yes  . Esophageal motility disorder   . Diverticulosis of colon (without mention of hemorrhage)

## 2012-10-29 DIAGNOSIS — R413 Other amnesia: Secondary | ICD-10-CM | POA: Insufficient documentation

## 2012-10-29 DIAGNOSIS — G40109 Localization-related (focal) (partial) symptomatic epilepsy and epileptic syndromes with simple partial seizures, not intractable, without status epilepticus: Secondary | ICD-10-CM | POA: Insufficient documentation

## 2012-11-18 ENCOUNTER — Ambulatory Visit (HOSPITAL_COMMUNITY)
Admission: RE | Admit: 2012-11-18 | Discharge: 2012-11-18 | Disposition: A | Discharge status: Home / Self Care | Payer: Medicare HMO | Source: Ambulatory Visit | Attending: Internal Medicine | Admitting: Internal Medicine

## 2012-11-18 DIAGNOSIS — IMO0001 Reserved for inherently not codable concepts without codable children: Secondary | ICD-10-CM | POA: Insufficient documentation

## 2012-11-18 DIAGNOSIS — M542 Cervicalgia: Secondary | ICD-10-CM | POA: Insufficient documentation

## 2012-11-18 DIAGNOSIS — M459 Ankylosing spondylitis of unspecified sites in spine: Secondary | ICD-10-CM | POA: Insufficient documentation

## 2012-11-18 DIAGNOSIS — M6281 Muscle weakness (generalized): Secondary | ICD-10-CM | POA: Insufficient documentation

## 2012-11-18 NOTE — Evaluation (Addendum)
Physical Therapy Evaluation/[employer]  Patient Details  Name: Robert Carey MRN: [id number] Date of Birth: [birth year]  Today's Date: 11/18/2012 Time: 1102-1145 PT Time Calculation (min): 43 min Charges: 1 eval, 10' Manual Visit#: 1  of 8   Re-eval: 12/18/12 Assessment Diagnosis: cervical pain with/Ank Spon Surgical Date: 11/07/12 (onset date) Next MD Visit: Dr. Dierdre Forth Prior Therapy: None  Authorization: [employer] MEDICARE: submitted 11/18/12  Authorization Time Period:    Authorization Visit#:   of     Past Medical History:  Past Medical History  Diagnosis Date  . Diverticulosis of colon (without mention of hemorrhage)   . Esophageal reflux   . Hiatal hernia   . Stricture and stenosis of esophagus   . Scleroderma   . Motility disorder, esophageal   . Gastroparesis   . CREST syndrome   . Raynauds phenomenon   . Seizure disorder   . Dementia   . Spondylitis, ankylosing    Past Surgical History:  Past Surgical History  Procedure Date  . Toe surgery     right great toe  . Thumb arthroscopy     left  . Dental surgery    Subjective Symptoms/Limitations Symptoms: PMH: seizure disorder from a virus 10 years ago, short term amnesia (remember 2-3 weeks) Pertinent History: Pt is referred to PT secondary to neck pain which started on 12/20 and has a hx of coughing and recent illness.  He has been taking predinose for his cough. He is not coughing as much now.  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 his c/co is difficulty turning his neck.  wife reports that he does a lot of exercises on a towel roll.  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 at [employer] and participates in Geophysicist/field seismologist.  Enjoys being with their grandchildren.   Cognition/Observation Cognition Overall Cognitive Status: Impaired at baseline Memory: Impaired Memory Impairment: Decreased short term memory  Sensation/Coordination/Flexibility/Functional Tests Coordination Gross Motor Movements are Fluid and Coordinated: No Coordination and Movement Description: impaired scapular stablizers, require max cueing.  Functional Tests Functional Tests: NDI: 38%  Assessment RUE Assessment RUE Assessment: Exceptions to [facility] RUE AROM (degrees) RUE Overall AROM Comments: shoulder abd and flexion to approximatly 80 degrees LUE Assessment LUE Assessment: Exceptions to [facility] LUE AROM (degrees) Left Shoulder Flexion: 120 Degrees Left Shoulder ABduction: 90 Degrees Left Shoulder External Rotation: 80 Degrees Cervical AROM Cervical Flexion: 5 cm Cervical Extension: 10 cm Cervical - Right Side Bend: 15 cm  Cervical - Left Side Bend: 19 cm - feels stiff Cervical - Right Rotation: 18 cm - feels normal Cervical - Left Rotation: 21 cm - feels like he has lost motion - turns head to left with swimming Cervical Strength Cervical Flexion: 3/5 Cervical Extension: 3/5 Cervical - Right Side Bend: 3/5 Cervical - Left Side Bend: 3/5 Cervical - Right Rotation: 3/5 Cervical - Left Rotation: 3/5 Palpation Palpation: significant fascial restrictions   Exercise/Treatments Manual Therapy Manual Therapy: Myofascial release Myofascial Release: to R cervical spine with 3x30 sec L cervical rotation.   Physical Therapy Assessment and Plan PT Assessment and Plan Clinical Impression Statement: Pt is a 66  year old male referred to PT for cervical pain with a signficant hx of ankylosing spondylitis, osteoporosis and mixed connective tissue disease.  After examination it was found that he has a hx of significant  limitations in R shoulder ROM and has new decreased L cervical rotation and SB.  Pt will benefit from skilled therapeutic intervention in order to improve on the following deficits: Decreased strength;Decreased range of motion;Pain;Impaired perceived functional ability Rehab Potential: Fair Clinical Impairments Affecting Rehab Potential: secondary to pt currently taking prednisone to control pain.  PT Frequency: Min 2X/week PT Duration: 6 weeks PT Treatment/Interventions: Gait training;Stair training;Functional mobility training;Therapeutic activities;Therapeutic exercise;Balance training;Neuromuscular re-education;Patient/family education;Manual techniques PT Plan: NO MODALITIES HX OF SEIZURE.  Gentle ROM for cervical spine.  Supine: chin tucks, laying on towel roll with shoulder abd, ER (open and close coat), and flexion to WNL (greatly limited to R shoulder).  Manual  techniques to decrease pain and tenderness to R cervical region.     Goals Home Exercise Program Pt will Perform Home Exercise Program: Independently PT Goal: Perform Home Exercise Program - Progress: Goal set today PT Short Term Goals Time to Complete Short Term Goals: 4 weeks PT Short Term Goal 1: Pt will improve L cervical rotation and SB by 2 cm PT Short Term Goal 2: Pt will improve scapular coordination.  PT Short Term Goal 3: Pt will improve cervical posture.  PT Short Term Goal 4: Pt will report pain less than 2/10 for 75% of his day to reduce medication intake.  PT Long Term Goals Time to Complete Long Term Goals: 8 weeks PT Long Term Goal 1: Pt will improve cervical ROM to [facility] in order to return to swimming activities without increased pain.  PT Long Term Goal 2: Pt will improve his NDI to less than 30% for improved percieved functional ability.  Long Term Goal 3: Pt will improve his L shoulder ROM by 10 degrees for greater ease of swimming.   Problem List Patient Active Problem List  Diagnosis  . DEMENTIA  .  RAYNAUD'S SYNDROME  . ESOPHAGEAL STRICTURE  . ESOPHAGEAL MOTILITY DISORDER  . GERD  . GASTROPARESIS  . HIATAL HERNIA  . DIVERTICULOSIS, COLON  . SCLERODERMA  . CREST SYNDROME  . Esophageal motility disorder  . GERD (gastroesophageal reflux disease)  . Cervical pain  . Ankylosing spondylitis    PT Plan of Care PT Home Exercise Plan: see scanned report PT Patient Instructions: discussed importance of posture and continuing with a HEP rountine.  Consulted and Agree with Plan of Care: Patient  GP Functional Assessment Tool Used: NDI: 38% impairement Functional Limitation: Self care Self Care Current Status (X9147): At least 20 percent but less than 40 percent impaired, limited or restricted Self Care Goal Status (W2956): At least 1 percent but less than 20 percent impaired, limited or restricted  Callen Vancuren, PT 11/18/2012, 3:05 PM  Physician Documentation Your signature is required to indicate approval of the treatment plan as stated above.  Please sign and either send electronically or make a copy of this report for your files and return this physician signed original.   Please mark one 1.__approve of plan  2. ___approve of plan with the following conditions.   ______________________________  _____________________ Physician Signature                                                                                                             Date

## 2012-11-21 ENCOUNTER — Ambulatory Visit (HOSPITAL_COMMUNITY)
Admission: RE | Admit: 2012-11-21 | Discharge: 2012-11-21 | Disposition: A | Discharge status: Home / Self Care | Payer: Medicare Other | Source: Ambulatory Visit | Attending: Internal Medicine | Admitting: Internal Medicine

## 2012-11-21 DIAGNOSIS — M542 Cervicalgia: Secondary | ICD-10-CM | POA: Insufficient documentation

## 2012-11-21 DIAGNOSIS — M6281 Muscle weakness (generalized): Secondary | ICD-10-CM | POA: Insufficient documentation

## 2012-11-21 DIAGNOSIS — IMO0001 Reserved for inherently not codable concepts without codable children: Secondary | ICD-10-CM | POA: Insufficient documentation

## 2012-11-21 NOTE — Progress Notes (Signed)
Physical Therapy Treatment Patient Details  Name: Robert Carey MRN: [id number] Date of Birth: 26-Nov-1945  Today's Date: 11/21/2012 Time: 6213-0865 PT Time Calculation (min): 34 min 18' therex 10' manual  Visit#: 2  of 8   Re-eval: 11/21/12    Authorization:    Authorization Time Period:    Authorization Visit#:   of     Subjective: Symptoms/Limitations Symptoms: Patient c/o of 5/10 neck pain Patient reports feeling more relaxed at end of session  Precautions/Restrictions     Exercise/Treatments Mobility/Balance        Seated Exercises Neck Retraction: 10 reps Cervical Rotation: 10 reps;Other (comment) (all directions) Lateral Flexion: Right;10 reps;Limitations (Left unable to flex) Supine Exercises Other Supine Exercise: chin tucks x10 with rolled rowel    Manual Therapy Manual Therapy: Myofascial release Myofascial Release: to R cervical spine with 3x30 sec L cervical rotation;supine also performed STM in prone with neck rest to cervical spine area-10' total  Physical Therapy Assessment and Plan PT Assessment and Plan Clinical Impression Statement: Added cervical ROM exercises;patient has no L lateral flexion and limited on R; performed myofascial release to R cervical area during L cervical rotation and bilaterall cervical STM for pain reduction. Continue with PT plan of care. PT Plan: NO MODALITIES HX OF SEIZURE.  Gentle ROM for cervical spine.  Supine: chin tucks, laying on towel roll with shoulder abd, ER (open and close coat), and flexion to WNL (greatly limited to R shoulder).  Manual  techniques to decrease pain and tenderness to R cervical region. PT Plan: Continue with PT plan of care    Goals    Problem List Patient Active Problem List  Diagnosis  . DEMENTIA  . RAYNAUD'S SYNDROME  . ESOPHAGEAL STRICTURE  . ESOPHAGEAL MOTILITY DISORDER  . GERD  . GASTROPARESIS  . HIATAL HERNIA  . DIVERTICULOSIS, COLON  . SCLERODERMA  . CREST SYNDROME  .  Esophageal motility disorder  . GERD (gastroesophageal reflux disease)  . Cervical pain  . Ankylosing spondylitis    PT - End of Session Activity Tolerance: Patient tolerated treatment well General Behavior During Session: [facility] for tasks performed Cognition: [facility] for tasks performed  GP    Robert Carey 11/21/2012, 2:47 PM

## 2012-11-25 ENCOUNTER — Ambulatory Visit (HOSPITAL_COMMUNITY): Payer: Self-pay | Admitting: Physical Therapy

## 2012-11-26 ENCOUNTER — Other Ambulatory Visit: Payer: Self-pay | Admitting: Internal Medicine

## 2012-11-26 ENCOUNTER — Ambulatory Visit
Admission: RE | Admit: 2012-11-26 | Discharge: 2012-11-26 | Disposition: A | Discharge status: Home / Self Care | Payer: Medicare Other | Source: Ambulatory Visit | Attending: Internal Medicine | Admitting: Internal Medicine

## 2012-11-26 DIAGNOSIS — M542 Cervicalgia: Secondary | ICD-10-CM

## 2012-11-27 ENCOUNTER — Other Ambulatory Visit: Payer: Self-pay | Admitting: Internal Medicine

## 2012-11-27 ENCOUNTER — Ambulatory Visit (HOSPITAL_COMMUNITY): Payer: Self-pay | Admitting: Physical Therapy

## 2012-11-27 DIAGNOSIS — M542 Cervicalgia: Secondary | ICD-10-CM

## 2012-11-28 ENCOUNTER — Ambulatory Visit
Admission: RE | Admit: 2012-11-28 | Discharge: 2012-11-28 | Disposition: A | Discharge status: Home / Self Care | Payer: Medicare Other | Source: Ambulatory Visit | Attending: Internal Medicine | Admitting: Internal Medicine

## 2012-11-28 DIAGNOSIS — M542 Cervicalgia: Secondary | ICD-10-CM

## 2012-11-28 MED ORDER — DIAZEPAM 5 MG PO TABS
5.0000 mg | ORAL_TABLET | Freq: Once | ORAL | Status: AC
  Administered 2012-11-28: 5 mg via ORAL

## 2012-11-28 MED ORDER — IOHEXOL 300 MG/ML  SOLN
1.0000 mL | Freq: Once | INTRAMUSCULAR | Status: AC | PRN
  Administered 2012-11-28: 1 mL via EPIDURAL

## 2012-11-28 MED ORDER — TRIAMCINOLONE ACETONIDE 40 MG/ML IJ SUSP (RADIOLOGY)
60.0000 mg | Freq: Once | INTRAMUSCULAR | Status: AC
  Administered 2012-11-28: 60 mg via EPIDURAL

## 2012-11-28 NOTE — Discharge Instructions (Signed)
Post Procedure Spinal Discharge Instruction Sheet  1. You may resume a regular diet and any medications that you routinely take (including pain medications).  2. No driving day of procedure.  3. Light activity throughout the rest of the day.  Do not do any strenuous work, exercise, bending or lifting.  The day following the procedure, you can resume normal physical activity but you should refrain from exercising or physical therapy for at least three days thereafter.   Common Side Effects:   Headaches- take your usual medications as directed by your physician.  Increase your fluid intake.  Caffeinated beverages may be helpful.  Lie flat in bed until your headache resolves.   Restlessness or inability to sleep- you may have trouble sleeping for the next few days.  Ask your referring physician if you need any medication for sleep.   Facial flushing or redness- should subside within a few days.   Increased pain- a temporary increase in pain a day or two following your procedure is not unusual.  Take your pain medication as prescribed by your referring physician.   Leg cramps  Please contact our office at [phone] for the following symptoms:  Fever greater than 100 degrees.  Headaches unresolved with medication after 2-3 days.  Increased swelling, pain, or redness at injection site.  Thank you for visiting our office.

## 2012-12-02 ENCOUNTER — Ambulatory Visit (HOSPITAL_COMMUNITY): Payer: Self-pay | Admitting: Physical Therapy

## 2012-12-04 ENCOUNTER — Ambulatory Visit (HOSPITAL_COMMUNITY): Payer: Self-pay | Admitting: Physical Therapy

## 2012-12-05 ENCOUNTER — Encounter: Payer: Self-pay | Admitting: Gastroenterology

## 2012-12-10 ENCOUNTER — Telehealth: Payer: Self-pay | Admitting: *Deleted

## 2012-12-10 NOTE — Telephone Encounter (Signed)
RECEIVED A FAXED FORM FROM [employer] FOR PRIOR AUTHORIZATION FOR PATIENTS ACIPHEX.  FORM FILLED OUT, FAXED AND SENT DOWNSTAIRS TO BE SCANNED IN

## 2013-01-10 ENCOUNTER — Other Ambulatory Visit: Payer: Self-pay | Admitting: Gastroenterology

## 2013-01-12 NOTE — Telephone Encounter (Signed)
PATIENT WILL NEED OFFICE VISIT FOR FURTHER REFILLS  

## 2013-01-28 ENCOUNTER — Encounter: Payer: Self-pay | Admitting: Gastroenterology

## 2013-01-28 ENCOUNTER — Ambulatory Visit (INDEPENDENT_AMBULATORY_CARE_PROVIDER_SITE_OTHER): Payer: Medicare Other | Admitting: Gastroenterology

## 2013-01-28 VITALS — BP 108/68 | HR 80 | Ht 71.0 in | Wt 152.4 lb

## 2013-01-28 DIAGNOSIS — K224 Dyskinesia of esophagus: Secondary | ICD-10-CM

## 2013-01-28 DIAGNOSIS — M459 Ankylosing spondylitis of unspecified sites in spine: Secondary | ICD-10-CM

## 2013-01-28 DIAGNOSIS — I7300 Raynaud's syndrome without gangrene: Secondary | ICD-10-CM

## 2013-01-28 MED ORDER — METOCLOPRAMIDE HCL 5 MG/5ML PO SOLN: ORAL | Status: DC

## 2013-01-28 MED ORDER — RABEPRAZOLE SODIUM 20 MG PO TBEC: 20.0000 mg | DELAYED_RELEASE_TABLET | Freq: Every day | ORAL | Status: DC

## 2013-01-28 NOTE — Progress Notes (Signed)
This is a extremely pleasant 67 year old Caucasian male with progressive dementia.  He has ankylosing spondylitis, rheumatoid arthritis, Raynaud's phenomenon with chronic GERD related to an esophageal motility disturbance.  He in the past as had very difficult to treat acid reflux as required numerous dilations.  He is currently asymptomatic on AcipHex 20 mg every morning and Reglan low dose 2.5 mg twice a day in liquid form.  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 Clarita Crane in rheumatology.  He did see take Celebrex 200 mg a day and when necessary Prilosec.  He is up-to-date on his endoscopy and colonoscopy exams, and denies lower GI or hepatobiliary complaints.  His appetite is good and his weight is stable.  There is no family history of known gastrointestinal disorders or colon cancer.  Current Medications, Allergies, Past Medical History, Past Surgical History, Family History and Social History were reviewed in [employer] record.  ROS: All systems were reviewed and are negative unless otherwise stated in the HPI.          Physical Exam: Blood pressure 108/68, pulse 80 and regular, and weight 152 with a BMI of 21.26.  I cannot appreciate stigmata of chronic liver disease but he does have a very flushed facial appearance.  He has a deviation of his hands and obvious Raynaud's cold hands.  Chest is generally clear I cannot appreciate murmurs gallops or rubs, and he appears to be in a regular rhythm.  There is no organomegaly, abdominal masses or tenderness.  Mental status is normal.    Assessment and Plan: Chronic GERD with an associated esophageal motility disorder associated with his collagen vascular disease/AS/Raynaud's disease.  He is doing extremely well on current medications which I have asked him to continue.  I do not think that this low dose Reglan is giving him  any neurological problems, and he seemed to have tolerated this well for several years.  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  His wife is with him today and takes a detailed notes concern in his care and medications.  He'll continue to see me yearly or when necessary as needed.  Have asked his primary care physician to send [place] a copy of his labs for our records.  He is due for followup colonoscopy in 2-3 years time. No diagnosis found.

## 2013-01-28 NOTE — Patient Instructions (Signed)
   Refill of Reglan and Aciphex sent to your pharmacy.  Please follow up in one year.  ____________________________________________________________________________________________                                               We are excited to introduce MyChart, a new best-in-class service that provides you online access to important information in your electronic medical record. We want to make it easier for you to view your health information - all in one secure location - when and where you need it. We expect MyChart will enhance the quality of care and service we provide.  When you register for MyChart, you can:    View your test results.    Request appointments and receive appointment reminders via email.    Request medication renewals.    View your medical history, allergies, medications and immunizations.    Communicate with your physician's office through a password-protected site.    Conveniently print information such as your medication lists.  To find out if MyChart is right for you, please talk to a member of our clinical staff today. We will gladly answer your questions about this free health and wellness tool.  If you are age 66 or older and want a member of your family to have access to your record, you must provide written consent by completing a proxy form available at our office. Please speak to our clinical staff about guidelines regarding accounts for patients younger than age 68.  As you activate your MyChart account and need any technical assistance, please call the MyChart technical support line at [phone]) 83-CHART [phone]) or email your question to [email or web address]. If you email your question(s), please include your name, a return phone number and the best time to reach you.  If you have non-urgent health-related questions, you can send a message to our office through MyChart at [email or web address]. If you have a medical emergency, call  911.  Thank you for using MyChart as your new health and wellness resource!   MyChart licensed from [employer],  [id number]. Patents Pending.

## 2013-02-24 ENCOUNTER — Telehealth: Payer: Self-pay | Admitting: Gastroenterology

## 2013-02-24 NOTE — Telephone Encounter (Signed)
No answer/machine I will continue to try and reach the patient tomorrow

## 2013-02-25 NOTE — Telephone Encounter (Signed)
Axona medical food supplement was recommended by his neurologist at [facility] for his memory.  Apparently has some GI side effects of gas, heartburn and diarrhea.  They wanted to get your opinion on Axona if you are aware of the supplement.  He is to slowly build up to the full dose of 2 Tbsp a day.  Dr. Jarold Motto have you heard of this and in your opinion is it ok for him to try?

## 2013-02-26 NOTE — Telephone Encounter (Signed)
No familiar with med would check with orgering doc

## 2013-09-14 ENCOUNTER — Telehealth: Payer: Self-pay | Admitting: Gastroenterology

## 2013-09-15 NOTE — Telephone Encounter (Signed)
Offered to start pt on Domperidone, but he will try Reglan once daily 1st.

## 2013-09-15 NOTE — Telephone Encounter (Signed)
This is ok but he may need domperidome

## 2013-09-15 NOTE — Telephone Encounter (Signed)
Pt last seen on 01/28/13 ; hx of GERD, esophageal motility disorder and he is on Reglan 2.5 mg BID. Pt reports he's noticing his hands are shaking and at night when he sleeps on his side, he notices his head shakes also. The [employer] nurse suggested Reglan may be the problems. Pt want to know if he can decrease the Reglan to once daily? Please advise. Thanks.

## 2013-10-13 ENCOUNTER — Other Ambulatory Visit: Payer: Self-pay | Admitting: Gastroenterology

## 2013-10-19 ENCOUNTER — Other Ambulatory Visit (HOSPITAL_COMMUNITY): Payer: Self-pay | Admitting: Internal Medicine

## 2013-10-19 ENCOUNTER — Ambulatory Visit (HOSPITAL_COMMUNITY)
Admission: RE | Admit: 2013-10-19 | Discharge: 2013-10-19 | Disposition: A | Discharge status: Home / Self Care | Payer: Medicare Other | Source: Ambulatory Visit | Attending: Internal Medicine | Admitting: Internal Medicine

## 2013-10-19 DIAGNOSIS — J4489 Other specified chronic obstructive pulmonary disease: Secondary | ICD-10-CM | POA: Insufficient documentation

## 2013-10-19 DIAGNOSIS — R0989 Other specified symptoms and signs involving the circulatory and respiratory systems: Secondary | ICD-10-CM

## 2013-10-19 DIAGNOSIS — J449 Chronic obstructive pulmonary disease, unspecified: Secondary | ICD-10-CM | POA: Insufficient documentation

## 2013-10-19 DIAGNOSIS — R059 Cough, unspecified: Secondary | ICD-10-CM | POA: Insufficient documentation

## 2013-10-19 DIAGNOSIS — R05 Cough: Secondary | ICD-10-CM | POA: Insufficient documentation

## 2013-10-20 ENCOUNTER — Other Ambulatory Visit (HOSPITAL_COMMUNITY): Payer: Self-pay | Admitting: Internal Medicine

## 2013-10-20 ENCOUNTER — Ambulatory Visit (HOSPITAL_COMMUNITY)
Admission: RE | Admit: 2013-10-20 | Discharge: 2013-10-20 | Disposition: A | Discharge status: Home / Self Care | Payer: Medicare Other | Source: Ambulatory Visit | Attending: Internal Medicine | Admitting: Internal Medicine

## 2013-10-20 DIAGNOSIS — R918 Other nonspecific abnormal finding of lung field: Secondary | ICD-10-CM | POA: Insufficient documentation

## 2013-10-20 DIAGNOSIS — J449 Chronic obstructive pulmonary disease, unspecified: Secondary | ICD-10-CM | POA: Insufficient documentation

## 2013-10-20 DIAGNOSIS — R911 Solitary pulmonary nodule: Secondary | ICD-10-CM

## 2013-10-20 DIAGNOSIS — J4489 Other specified chronic obstructive pulmonary disease: Secondary | ICD-10-CM | POA: Insufficient documentation

## 2013-12-01 ENCOUNTER — Telehealth: Payer: Self-pay | Admitting: *Deleted

## 2013-12-01 NOTE — Telephone Encounter (Signed)
Patient sent an email requesting Aciphex needs prior auth  I called [facility] and per Manuela Schwartz patient does not need a prior auth for Aciphex  I called patient and per patient non formulary exception is up December 16, 2013 he received letter from [facility] I advised patient that means I have to call insurance company to start a prior auth, which means I have to tell insurance why he needs medication, plus diagnosis I advised patient that I will call him by end of day because Dr. Sharlett Iles is in office and we have patients Patient gave me [employer] number to call Patient verbalized understanding  I called [employer] at [phone]. I spoke with Domenick Gong.  Alyse Low S transferred me to exceptions department for medications([phone]) I spoke with Shawn B. Per Shawn they fax form over to be filled out and faxed back, then 24-72 hours I will have decision from insurance company  I called patient back Patient said they are good for the month on Aciphex, will need RX for Feb though I advised patient that we should have this resolved before month ends  I advised patient I will call them once I hear from [facility] Patient verbalized understanding

## 2014-02-23 ENCOUNTER — Other Ambulatory Visit: Payer: Self-pay | Admitting: Gastroenterology

## 2014-02-24 ENCOUNTER — Telehealth: Payer: Self-pay | Admitting: Gastroenterology

## 2014-02-24 MED ORDER — METOCLOPRAMIDE HCL 5 MG/5ML PO SOLN: ORAL | Status: DC

## 2014-02-24 NOTE — Telephone Encounter (Signed)
Patient has a follow up visit scheduled with you on 03-02-2014. Patient is requesting refill on Reglan, is it ok to refill?

## 2014-02-24 NOTE — Addendum Note (Signed)
Addended by: Hope Pigeon A on: 02/24/2014 10:58 AM   Modules accepted: Orders

## 2014-02-24 NOTE — Telephone Encounter (Signed)
A patient of Dr. Buel Ream. I never saw this patient previously. I have reviewed Dr. Buel Ream notes. Okay to refill Reglan at low dose as has been given previously. The patient does need a routine office followup in the next few months.

## 2014-03-02 ENCOUNTER — Ambulatory Visit (INDEPENDENT_AMBULATORY_CARE_PROVIDER_SITE_OTHER): Payer: Medicare Other | Admitting: Internal Medicine

## 2014-03-02 ENCOUNTER — Encounter: Payer: Self-pay | Admitting: Internal Medicine

## 2014-03-02 VITALS — BP 110/80 | HR 72 | Ht 71.0 in | Wt 151.0 lb

## 2014-03-02 DIAGNOSIS — K224 Dyskinesia of esophagus: Secondary | ICD-10-CM

## 2014-03-02 DIAGNOSIS — K219 Gastro-esophageal reflux disease without esophagitis: Secondary | ICD-10-CM

## 2014-03-02 DIAGNOSIS — K5731 Diverticulosis of large intestine without perforation or abscess with bleeding: Secondary | ICD-10-CM

## 2014-03-02 NOTE — Patient Instructions (Signed)
Discontinue Reglan as discussed with Dr. Henrene Pastor.  Please follow up as needed

## 2014-03-02 NOTE — Progress Notes (Signed)
HISTORY OF PRESENT ILLNESS:  Robert Carey is a 68 y.o. male with multiple significant medical problems as listed below. He is a long-term patient of Dr. Verl Carey, prior to Dr. Buel Carey recent retirement. 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 The patient presents today to establish. Also to discuss metoclopramide which she has been on chronically. He is accompanied by his wife who participate significantly, including taking notes. He was last evaluated by Dr. Sharlett Carey in March of 2014. I have reviewed that note. For his GERD she takes AcipHex 20 mg in the morning and omeprazole 20 mg at night. He also takes low-dose metoclopramide. Approximately 2.5 mg in the morning only. From a reflux standpoint he has been doing well. No significant indigestion or heartburn. No recurrent dysphagia. His last upper endoscopy with esophageal dilation was performed January 2005. His last screening colonoscopy was performed February 2006. This was negative except for diverticulosis. The patient and his wife have been fully aware of potential neurologic side effects from metoclopramide. They tell me that in the fall and had noticed some twitching at night occasionally. As well some hand tremor during the day. I do see a neurologist regularly who was not convinced this was related to metoclopramide. He does have a seizure disorder. He is on multiple medicines. No difficulty with bowel movements. His weight has been variable  REVIEW OF SYSTEMS:  All non-GI ROS negative except for arthritis, confusion, cough, skin rash, hearing problems, sleeping problems,  Past Medical History  Diagnosis Date  . Diverticulosis of colon (without mention of hemorrhage)   . Esophageal reflux   . Hiatal hernia   . Stricture and stenosis of esophagus   .  Scleroderma   . Motility disorder, esophageal   . Gastroparesis   . CREST syndrome   . Raynauds phenomenon   . Seizure disorder   . Dementia   . Spondylitis, ankylosing   . Mixed connective tissue disease     Past Surgical History  Procedure Laterality Date  . Toe surgery      right great toe  . Thumb arthroscopy Left   . Wisdom tooth extraction    . Nose surgery  2005    Social History Robert Carey  reports that he has never smoked. He has never used smokeless tobacco. He reports that he does not drink alcohol or use illicit drugs.  family history includes Breast cancer in his mother; COPD in his father; Diabetes in his mother; Heart disease in his father and mother; Pancreatic cancer in his mother. There is no history of Colon cancer.  Allergies  Allergen Reactions  . Phenytoin Sodium Extended Rash       PHYSICAL EXAMINATION: Vital signs: BP 110/80  Pulse 72  Ht 5\' 11"  (1.803 m)  Wt 151 lb (68.493 kg)  BMI 21.07 kg/m2 General: Well-developed, well-nourished, no acute distress HEENT: Sclerae are anicteric, conjunctiva pink. Oral mucosa intact Lungs: Clear Heart: Regular Abdomen: soft, nontender, nondistended, no obvious ascites, no peritoneal signs, normal bowel sounds. No organomegaly. Extremities: No edema Psychiatric: alert and oriented x3. Cooperative Neuro: No twitching or tremor   ASSESSMENT:  #1. GERD. Symptoms controlled on twice a day PPI #2. CREST with a history of esophageal dysmotility and stricture requiring dilation. Currently asymptomatic post dilation #3. Metoclopramide. Discussed with the patient and his wife that this is not a particularly good  promotility agent. Particularly over time with the phenomenon of tachyphylaxis. As well, currently on a very low dose. I think he would be best is to discontinue this medication and advised them accordingly #4. Index colonoscopy 2006 with diverticulosis   PLAN:  #1. Continue twice a day  PPI #2. Discontinue metoclopramide #3. Consider repeat screening colonoscopy 2016 #4. Routine GI office followup 2 years. Sooner if needed

## 2014-04-16 ENCOUNTER — Other Ambulatory Visit: Payer: Self-pay | Admitting: Gastroenterology

## 2014-10-04 ENCOUNTER — Emergency Department (HOSPITAL_COMMUNITY)
Admission: EM | Admit: 2014-10-04 | Discharge: 2014-10-04 | Disposition: A | Discharge status: Home / Self Care | Payer: Medicare Other | Attending: Emergency Medicine | Admitting: Emergency Medicine

## 2014-10-04 ENCOUNTER — Emergency Department (HOSPITAL_COMMUNITY): Payer: Medicare Other

## 2014-10-04 ENCOUNTER — Encounter (HOSPITAL_COMMUNITY): Payer: Self-pay | Admitting: Emergency Medicine

## 2014-10-04 DIAGNOSIS — Z7982 Long term (current) use of aspirin: Secondary | ICD-10-CM | POA: Diagnosis not present

## 2014-10-04 DIAGNOSIS — Y9289 Other specified places as the place of occurrence of the external cause: Secondary | ICD-10-CM | POA: Diagnosis not present

## 2014-10-04 DIAGNOSIS — Z79899 Other long term (current) drug therapy: Secondary | ICD-10-CM | POA: Insufficient documentation

## 2014-10-04 DIAGNOSIS — W109XXA Fall (on) (from) unspecified stairs and steps, initial encounter: Secondary | ICD-10-CM | POA: Insufficient documentation

## 2014-10-04 DIAGNOSIS — W19XXXA Unspecified fall, initial encounter: Secondary | ICD-10-CM

## 2014-10-04 DIAGNOSIS — K219 Gastro-esophageal reflux disease without esophagitis: Secondary | ICD-10-CM | POA: Insufficient documentation

## 2014-10-04 DIAGNOSIS — G40909 Epilepsy, unspecified, not intractable, without status epilepticus: Secondary | ICD-10-CM | POA: Insufficient documentation

## 2014-10-04 DIAGNOSIS — S0121XA Laceration without foreign body of nose, initial encounter: Secondary | ICD-10-CM | POA: Insufficient documentation

## 2014-10-04 DIAGNOSIS — S0181XA Laceration without foreign body of other part of head, initial encounter: Secondary | ICD-10-CM | POA: Diagnosis present

## 2014-10-04 DIAGNOSIS — F0390 Unspecified dementia without behavioral disturbance: Secondary | ICD-10-CM | POA: Insufficient documentation

## 2014-10-04 DIAGNOSIS — Z8739 Personal history of other diseases of the musculoskeletal system and connective tissue: Secondary | ICD-10-CM | POA: Insufficient documentation

## 2014-10-04 DIAGNOSIS — Y9389 Activity, other specified: Secondary | ICD-10-CM | POA: Diagnosis not present

## 2014-10-04 DIAGNOSIS — Z23 Encounter for immunization: Secondary | ICD-10-CM | POA: Insufficient documentation

## 2014-10-04 DIAGNOSIS — Y998 Other external cause status: Secondary | ICD-10-CM | POA: Insufficient documentation

## 2014-10-04 DIAGNOSIS — Z7952 Long term (current) use of systemic steroids: Secondary | ICD-10-CM | POA: Insufficient documentation

## 2014-10-04 MED ORDER — SODIUM BICARBONATE 4 % IV SOLN
5.0000 mL | Freq: Once | INTRAVENOUS | Status: AC
Start: 2014-10-04 — End: 2014-10-04
  Administered 2014-10-04: 5 mL via SUBCUTANEOUS
  Filled 2014-10-04: qty 5

## 2014-10-04 MED ORDER — BACITRACIN ZINC 500 UNIT/GM EX OINT
TOPICAL_OINTMENT | CUTANEOUS | Status: AC
  Administered 2014-10-04: 1
  Filled 2014-10-04: qty 0.9

## 2014-10-04 MED ORDER — TETANUS-DIPHTH-ACELL PERTUSSIS 5-2.5-18.5 LF-MCG/0.5 IM SUSP
0.5000 mL | Freq: Once | INTRAMUSCULAR | Status: AC
  Administered 2014-10-04: 0.5 mL via INTRAMUSCULAR
  Filled 2014-10-04: qty 0.5

## 2014-10-04 MED ORDER — LIDOCAINE HCL (PF) 1 % IJ SOLN
5.0000 mL | Freq: Once | INTRAMUSCULAR | Status: AC
  Administered 2014-10-04: 5 mL
  Filled 2014-10-04: qty 5

## 2014-10-04 NOTE — ED Provider Notes (Signed)
CSN: [id number]     Arrival date & time 10/04/14  1754 History  This chart was scribed for [employer]. Alvino Chapel, MD by Tula Nakayama, ED Scribe. This patient was seen in room APA05/APA05 and the patient's care was started at 9:36 PM.    Chief Complaint  Patient presents with  . Fall   The history is provided by the patient. No language interpreter was used.    HPI Comments: Robert Carey is a 68 y.o. male who presents to the Emergency Department complaining of lacerations, with controlled bleeding, to his forehead and the bridge of his nose that occurred after he tripped and fell down the stairs earlier today. He notes a bloody nose after the fall, but states he did not lose consciousness. Pt denies worse-than-baseline neck pain. Pt takes baby Aspirin daily, but is not on anti-coagulants. He is not sure if UTD on Tetanus. Pt denies any other injuries as a result of the fall.   Past Medical History  Diagnosis Date  . Diverticulosis of colon (without mention of hemorrhage)   . Esophageal reflux   . Hiatal hernia   . Stricture and stenosis of esophagus   . Scleroderma   . Motility disorder, esophageal   . Gastroparesis   . CREST syndrome   . Raynauds phenomenon   . Seizure disorder   . Dementia   . Spondylitis, ankylosing   . Mixed connective tissue disease    Past Surgical History  Procedure Laterality Date  . Toe surgery      right great toe  . Thumb arthroscopy Left   . Wisdom tooth extraction    . Nose surgery  2005   Family History  Problem Relation Age of Onset  . Breast cancer Mother   . Pancreatic cancer Mother   . Diabetes Mother   . Heart disease Mother   . Heart disease Father   . Colon cancer Neg Hx   . COPD Father    History  Substance Use Topics  . Smoking status: Never Smoker   . Smokeless tobacco: Never Used  . Alcohol Use: No    Review of Systems  Musculoskeletal: Negative for back pain and neck pain.  Skin: Positive for wound. Negative for  color change.  Neurological: Negative for headaches.  All other systems reviewed and are negative.  Allergies  Phenytoin sodium extended  Home Medications   Prior to Admission medications   Medication Sig Start Date End Date Taking? Authorizing Provider  ACIPHEX 20 MG tablet TAKE ONE TABLET BY MOUTH ONCE DAILY. 04/16/14  Yes Irene Shipper, MD  aspirin EC 81 MG tablet Take 81 mg by mouth daily.   Yes Historical Provider, MD  Calcium Carbonate-Vitamin D (CALCARB 600/D PO) Take 1 tablet by mouth 2 (two) times daily.    Yes Historical Provider, MD  celecoxib (CELEBREX) 200 MG capsule Take 200 mg by mouth daily.   Yes Historical Provider, MD  lacosamide (VIMPAT) 200 MG TABS tablet Take 200-300 mg by mouth 2 (two) times daily. Take 339m daily in the morning and 2011min the evening   Yes Historical Provider, MD  levETIRAcetam (KEPPRA) 250 MG tablet Take 500 mg by mouth every evening. *Takes two 25014mablets in addition to two 750m63mblets for a total of 2000mg63mly in the evening at 7pm. Takes two 750mg 58mets in the morning for a total of 1500mg d58m in the morning*   Yes Historical Provider, MD  levETIRAcetam (KEPPRA) 750 MG tablet  Take 1,500-2,000 mg by mouth See admin instructions. Take 1500 mg every morning and 2075m at bedtime. *Takes two 2538mtablets in addition to two 75064mablets for a total of 2000m65mily in the evening at 7pm. Takes two 750mg39mlets in the morning for a total of 1500mg 91my in the morning*   Yes Historical Provider, MD  Multiple Vitamin (MULTIVITAMIN) capsule Take 1 capsule by mouth daily.   Yes Historical Provider, MD  NIFEdipine (PROCARDIA XL/ADALAT-CC) 60 MG 24 hr tablet Take 60 mg by mouth daily.   Yes Historical Provider, MD  omeprazole (PRILOSEC OTC) 20 MG tablet Take 20 mg by mouth at bedtime.    Yes Historical Provider, MD  predniSONE (DELTASONE) 5 MG tablet Take 5 mg by mouth daily.   Yes Historical Provider, MD  risedronate (ACTONEL) 35 MG tablet Take  35 mg by mouth every Saturday. with water on empty stomach, nothing by mouth or lie down for next 30 minutes.   Yes Historical Provider, MD   BP 134/95 mmHg  Pulse 72  Temp(Src) 98 F (36.7 C) (Oral)  Resp 16  Ht 5' 11"  (1.803 m)  Wt 145 lb (65.772 kg)  BMI 20.23 kg/m2  SpO2 100% Physical Exam  Constitutional: He appears well-developed and well-nourished. No distress.  Awake and appropriate  HENT:  Head: Normocephalic and atraumatic.  Dry blood in nose, no septal hematoma. No hemotympanum in bilateral TMs.   Eyes: Conjunctivae and EOM are normal.  Neck: Neck supple. No tracheal deviation present.  Cardiovascular: Normal rate.   Pulmonary/Chest: Effort normal. No respiratory distress.  Musculoskeletal:  Can raise both eyebrows. No bilateral extremity tenderness. Moving all extremities. No neck pain.  Skin: Skin is warm and dry.  Stellate laceration to mid forehead with some underlying tenderness. 0.5 cm laceration across bridge of nose even with glasses.   Psychiatric: He has a normal mood and affect. His behavior is normal.  Nursing note and vitals reviewed.   ED Course  Procedures (including critical care time)  COORDINATION OF CARE: 9:40 PM Discussed treatment plan with pt at bedside and pt agreed to plan.  Results for orders placed or performed during the hospital encounter of 08/05/07  CBC  Result Value Ref Range   WBC 7.5    RBC 4.85    Hemoglobin 14.6    HCT 43.0    MCV 88.5    MCHC 33.9    RDW 13.5    Platelets 201   Comprehensive metabolic panel  Result Value Ref Range   Sodium 138    Potassium 4.2    Chloride 103    CO2 27    Glucose, Bld 127 (H)    BUN 16    Creatinine, Ser 1.11    Calcium 9.5    Total Protein 8.7 (H)    Albumin 3.8    AST 31    ALT 15    Alkaline Phosphatase 70    Total Bilirubin 0.5    GFR calc non Af Amer >60    GFR calc Af Amer      >60        The eGFR has been calculated using the MDRD equation. This calculation has  not been validated in all clinical  Differential  Result Value Ref Range   Neutrophils Relative % 76    Neutro Abs 5.7    Lymphocytes Relative 14    Lymphs Abs 1.0    Monocytes Relative 10    Monocytes Absolute 0.7  Eosinophils Relative 0    Eosinophils Absolute 0.0    Basophils Relative 1    Basophils Absolute 0.0   Cardiac markers, POC  Result Value Ref Range   Operator id [id number]    Myoglobin, poc 90.6    CKMB, poc 2.6    Troponin i, poc <0.05   Cardiac markers, POC  Result Value Ref Range   Operator id [id number]    Myoglobin, poc 53.1    CKMB, poc 1.4    Troponin i, poc <0.05   Cardiac markers, POC  Result Value Ref Range   Operator id [id number]    Myoglobin, poc 66.0    CKMB, poc 1.5    Troponin i, poc <0.05    Ct Head Wo Contrast  10/04/2014   CLINICAL DATA:  Tripped and fell down steps today. Laceration to forehead and nose.  EXAM: CT HEAD WITHOUT CONTRAST  TECHNIQUE: Contiguous axial images were obtained from the base of the skull through the vertex without intravenous contrast.  COMPARISON:  None.  FINDINGS: Mild chronic atrophy. Patchy low-attenuation change in the deep white matter consistent with small vessel ischemia. No mass effect or midline shift. No abnormal extra-axial fluid collections. Gray-white matter junctions are distinct. Basal cisterns are not effaced. No evidence of acute intracranial hemorrhage. No depressed skull fractures. Visualized paranasal sinuses and mastoid air cells are not opacified. Small soft tissue laceration along the anterior forehead. Vascular calcifications.  IMPRESSION: No acute intracranial abnormalities. Chronic atrophy and small vessel ischemic changes.   Electronically Signed   By: Lucienne Capers M.D.   On: 10/04/2014 22:05    Labs Review Labs Reviewed - No data to display  Imaging Review Ct Head Wo Contrast  10/04/2014   CLINICAL DATA:  Tripped and fell down steps today. Laceration to forehead and nose.  EXAM: CT HEAD WITHOUT  CONTRAST  TECHNIQUE: Contiguous axial images were obtained from the base of the skull through the vertex without intravenous contrast.  COMPARISON:  None.  FINDINGS: Mild chronic atrophy. Patchy low-attenuation change in the deep white matter consistent with small vessel ischemia. No mass effect or midline shift. No abnormal extra-axial fluid collections. Gray-white matter junctions are distinct. Basal cisterns are not effaced. No evidence of acute intracranial hemorrhage. No depressed skull fractures. Visualized paranasal sinuses and mastoid air cells are not opacified. Small soft tissue laceration along the anterior forehead. Vascular calcifications.  IMPRESSION: No acute intracranial abnormalities. Chronic atrophy and small vessel ischemic changes.   Electronically Signed   By: Lucienne Capers M.D.   On: 10/04/2014 22:05     EKG Interpretation None      MDM   Final diagnoses:  Fall  Forehead laceration, initial encounter    Patient with fall. Laceration to forehead and bridge of nose. Both repaired with chromic gut. About 1.5 cm laceration to forehead did have a stellate component of some of the layers of skin gone. Normal head CT. Will discharge home. Patient has follow-up with his PCP tomorrow.  LACERATION REPAIR Performed by: Mackie Pai Authorized by: Mackie Pai Consent: Verbal consent obtained. Risks and benefits: risks, benefits and alternatives were discussed Consent given by: patient Patient identity confirmed: provided demographic data Prepped and Draped in normal sterile fashion Wound explored  Laceration Location: Forehead  Laceration Length: 1.5cm  No Foreign Bodies seen or palpated  Anesthesia: local infiltration  Local anesthetic: lidocaine 1% without epinephrine with neut  Anesthetic total: 4 ml  Irrigation method: syringe Amount of cleaning: standard  Skin  closure: 5-0 chromic gut  Number of sutures: 4  Technique: simple  interrupted  Patient tolerance: Patient tolerated the procedure well with no immediate complications.  LACERATION REPAIR Performed by: Mackie Pai Authorized by: Mackie Pai Consent: Verbal consent obtained. Risks and benefits: risks, benefits and alternatives were discussed Consent given by: patient Patient identity confirmed: provided demographic data Prepped and Draped in normal sterile fashion Wound explored  Laceration Location: bridge of nose  Laceration Length: 0.5cm  No Foreign Bodies seen or palpated  Anesthesia: local infiltration  Local anesthetic: lidocaine 1% without epinephrine with neut Anesthetic total: 24m  Irrigation method: syringe Amount of cleaning: standard  Skin closure: 5-0 chromic gut  Number of sutures: 2  Technique: simple interrupted  Patient tolerance: Patient tolerated the procedure well with no immediate complications.   I personally performed the services described in this documentation, which was scribed in my presence. The recorded information has been reviewed and is accurate.     NJasper Riling PAlvino Chapel MD 10/05/14 [phone]

## 2014-10-04 NOTE — Discharge Instructions (Signed)
Facial Laceration  A facial laceration is a cut on the face. These injuries can be painful and cause bleeding. Lacerations usually heal quickly, but they need special care to reduce scarring. DIAGNOSIS  Your health care provider will take a medical history, ask for details about how the injury occurred, and examine the wound to determine how deep the cut is. TREATMENT  Some facial lacerations may not require closure. Others may not be able to be closed because of an increased risk of infection. The risk of infection and the chance for successful closure will depend on various factors, including the amount of time since the injury occurred. The wound may be cleaned to help prevent infection. If closure is appropriate, pain medicines may be given if needed. Your health care provider will use stitches (sutures), wound glue (adhesive), or skin adhesive strips to repair the laceration. These tools bring the skin edges together to allow for faster healing and a better cosmetic outcome. If needed, you may also be given a tetanus shot. HOME CARE INSTRUCTIONS  Only take over-the-counter or prescription medicines as directed by your health care provider.  Follow your health care provider's instructions for wound care. These instructions will vary depending on the technique used for closing the wound. For Sutures:  Keep the wound clean and dry.   If you were given a bandage (dressing), you should change it at least once a day. Also change the dressing if it becomes wet or dirty, or as directed by your health care provider.   Wash the wound with soap and water 2 times a day. Rinse the wound off with water to remove all soap. Pat the wound dry with a clean towel.   After cleaning, apply a thin layer of the antibiotic ointment recommended by your health care provider. This will help prevent infection and keep the dressing from sticking.   You may shower as usual after the first 24 hours. Do not soak the  wound in water until the sutures are removed.   Get your sutures removed as directed by your health care provider. With facial lacerations, sutures should usually be taken out after 4-5 days to avoid stitch marks.   Wait a few days after your sutures are removed before applying any makeup. For Skin Adhesive Strips:  Keep the wound clean and dry.   Do not get the skin adhesive strips wet. You may bathe carefully, using caution to keep the wound dry.   If the wound gets wet, pat it dry with a clean towel.   Skin adhesive strips will fall off on their own. You may trim the strips as the wound heals. Do not remove skin adhesive strips that are still stuck to the wound. They will fall off in time.  For Wound Adhesive:  You may briefly wet your wound in the shower or bath. Do not soak or scrub the wound. Do not swim. Avoid periods of heavy sweating until the skin adhesive has fallen off on its own. After showering or bathing, gently pat the wound dry with a clean towel.   Do not apply liquid medicine, cream medicine, ointment medicine, or makeup to your wound while the skin adhesive is in place. This may loosen the film before your wound is healed.   If a dressing is placed over the wound, be careful not to apply tape directly over the skin adhesive. This may cause the adhesive to be pulled off before the wound is healed.   Avoid   prolonged exposure to sunlight or tanning lamps while the skin adhesive is in place.  The skin adhesive will usually remain in place for 5-10 days, then naturally fall off the skin. Do not pick at the adhesive film.  After Healing: Once the wound has healed, cover the wound with sunscreen during the day for 1 full year. This can help minimize scarring. Exposure to ultraviolet light in the first year will darken the scar. It can take 1-2 years for the scar to lose its redness and to heal completely.  SEEK IMMEDIATE MEDICAL CARE IF:  You have redness, pain, or  swelling around the wound.   You see ayellowish-white fluid (pus) coming from the wound.   You have chills or a fever.  MAKE SURE YOU:  Understand these instructions.  Will watch your condition.  Will get help right away if you are not doing well or get worse. Document Released: 12/13/2004 Document Revised: 08/26/2013 Document Reviewed: 06/18/2013 [employer] Patient Information 2015 [employer]. This information is not intended to replace advice given to you by your health care provider. Make sure you discuss any questions you have with your health care provider.  

## 2014-10-04 NOTE — ED Notes (Signed)
Pt tripped and fell down steps, cut to head and bridge of nose, denies LOC

## 2014-11-03 ENCOUNTER — Encounter (HOSPITAL_COMMUNITY): Payer: Self-pay | Admitting: Emergency Medicine

## 2014-11-03 ENCOUNTER — Emergency Department (HOSPITAL_COMMUNITY): Payer: Medicare Other

## 2014-11-03 ENCOUNTER — Emergency Department (HOSPITAL_COMMUNITY)
Admission: EM | Admit: 2014-11-03 | Discharge: 2014-11-03 | Disposition: A | Discharge status: Home / Self Care | Payer: Medicare Other | Attending: Emergency Medicine | Admitting: Emergency Medicine

## 2014-11-03 DIAGNOSIS — F0390 Unspecified dementia without behavioral disturbance: Secondary | ICD-10-CM | POA: Insufficient documentation

## 2014-11-03 DIAGNOSIS — Z7982 Long term (current) use of aspirin: Secondary | ICD-10-CM | POA: Insufficient documentation

## 2014-11-03 DIAGNOSIS — W0110XA Fall on same level from slipping, tripping and stumbling with subsequent striking against unspecified object, initial encounter: Secondary | ICD-10-CM | POA: Diagnosis not present

## 2014-11-03 DIAGNOSIS — S80811A Abrasion, right lower leg, initial encounter: Secondary | ICD-10-CM | POA: Diagnosis not present

## 2014-11-03 DIAGNOSIS — Y998 Other external cause status: Secondary | ICD-10-CM | POA: Diagnosis not present

## 2014-11-03 DIAGNOSIS — K219 Gastro-esophageal reflux disease without esophagitis: Secondary | ICD-10-CM | POA: Diagnosis not present

## 2014-11-03 DIAGNOSIS — Z79899 Other long term (current) drug therapy: Secondary | ICD-10-CM | POA: Insufficient documentation

## 2014-11-03 DIAGNOSIS — I7300 Raynaud's syndrome without gangrene: Secondary | ICD-10-CM | POA: Diagnosis not present

## 2014-11-03 DIAGNOSIS — Z7983 Long term (current) use of bisphosphonates: Secondary | ICD-10-CM | POA: Diagnosis not present

## 2014-11-03 DIAGNOSIS — Y92008 Other place in unspecified non-institutional (private) residence as the place of occurrence of the external cause: Secondary | ICD-10-CM | POA: Insufficient documentation

## 2014-11-03 DIAGNOSIS — Y9389 Activity, other specified: Secondary | ICD-10-CM | POA: Diagnosis not present

## 2014-11-03 DIAGNOSIS — S0990XA Unspecified injury of head, initial encounter: Secondary | ICD-10-CM | POA: Diagnosis present

## 2014-11-03 DIAGNOSIS — G40909 Epilepsy, unspecified, not intractable, without status epilepticus: Secondary | ICD-10-CM | POA: Insufficient documentation

## 2014-11-03 DIAGNOSIS — W19XXXA Unspecified fall, initial encounter: Secondary | ICD-10-CM

## 2014-11-03 DIAGNOSIS — S0181XA Laceration without foreign body of other part of head, initial encounter: Secondary | ICD-10-CM | POA: Insufficient documentation

## 2014-11-03 DIAGNOSIS — Z7952 Long term (current) use of systemic steroids: Secondary | ICD-10-CM | POA: Insufficient documentation

## 2014-11-03 DIAGNOSIS — M459 Ankylosing spondylitis of unspecified sites in spine: Secondary | ICD-10-CM | POA: Insufficient documentation

## 2014-11-03 DIAGNOSIS — Z791 Long term (current) use of non-steroidal anti-inflammatories (NSAID): Secondary | ICD-10-CM | POA: Diagnosis not present

## 2014-11-03 MED ORDER — LIDOCAINE-EPINEPHRINE (PF) 2 %-1:200000 IJ SOLN
10.0000 mL | Freq: Once | INTRAMUSCULAR | Status: AC
  Administered 2014-11-03: 10 mL via INTRADERMAL
  Filled 2014-11-03: qty 20

## 2014-11-03 MED ORDER — BACITRACIN ZINC 500 UNIT/GM EX OINT
TOPICAL_OINTMENT | CUTANEOUS | Status: AC
  Filled 2014-11-03: qty 0.9

## 2014-11-03 NOTE — Discharge Instructions (Signed)
Cleanse the wound, twice a day. Start with a warm compress followed by soap cleansing, and rinsing well with good drying. After the wound care, use a small amount of antibiotic ointments to his Neosporin on the wound. The wound can be open to air, but may do better with a gauze bandage for the first 2 days because of anticipated bleeding, and drainage from the wound.  After the drainage improves, a Band-Aid, or open air is fine. Return here if you feel like he is having problems with a head injury.   Facial Laceration  A facial laceration is a cut on the face. These injuries can be painful and cause bleeding. Lacerations usually heal quickly, but they need special care to reduce scarring. DIAGNOSIS  Your health care provider will take a medical history, ask for details about how the injury occurred, and examine the wound to determine how deep the cut is. TREATMENT  Some facial lacerations may not require closure. Others may not be able to be closed because of an increased risk of infection. The risk of infection and the chance for successful closure will depend on various factors, including the amount of time since the injury occurred. The wound may be cleaned to help prevent infection. If closure is appropriate, pain medicines may be given if needed. Your health care provider will use stitches (sutures), wound glue (adhesive), or skin adhesive strips to repair the laceration. These tools bring the skin edges together to allow for faster healing and a better cosmetic outcome. If needed, you may also be given a tetanus shot. HOME CARE INSTRUCTIONS  Only take over-the-counter or prescription medicines as directed by your health care provider.  Follow your health care provider's instructions for wound care. These instructions will vary depending on the technique used for closing the wound. For Sutures:  Keep the wound clean and dry.   If you were given a bandage (dressing), you should change it  at least once a day. Also change the dressing if it becomes wet or dirty, or as directed by your health care provider.   Wash the wound with soap and water 2 times a day. Rinse the wound off with water to remove all soap. Pat the wound dry with a clean towel.   After cleaning, apply a thin layer of the antibiotic ointment recommended by your health care provider. This will help prevent infection and keep the dressing from sticking.   You may shower as usual after the first 24 hours. Do not soak the wound in water until the sutures are removed.   Get your sutures removed as directed by your health care provider. With facial lacerations, sutures should usually be taken out after 4-5 days to avoid stitch marks.   Wait a few days after your sutures are removed before applying any makeup. For Skin Adhesive Strips:  Keep the wound clean and dry.   Do not get the skin adhesive strips wet. You may bathe carefully, using caution to keep the wound dry.   If the wound gets wet, pat it dry with a clean towel.   Skin adhesive strips will fall off on their own. You may trim the strips as the wound heals. Do not remove skin adhesive strips that are still stuck to the wound. They will fall off in time.  For Wound Adhesive:  You may briefly wet your wound in the shower or bath. Do not soak or scrub the wound. Do not swim. Avoid periods of heavy  sweating until the skin adhesive has fallen off on its own. After showering or bathing, gently pat the wound dry with a clean towel.   Do not apply liquid medicine, cream medicine, ointment medicine, or makeup to your wound while the skin adhesive is in place. This may loosen the film before your wound is healed.   If a dressing is placed over the wound, be careful not to apply tape directly over the skin adhesive. This may cause the adhesive to be pulled off before the wound is healed.   Avoid prolonged exposure to sunlight or tanning lamps while the  skin adhesive is in place.  The skin adhesive will usually remain in place for 5-10 days, then naturally fall off the skin. Do not pick at the adhesive film.  After Healing: Once the wound has healed, cover the wound with sunscreen during the day for 1 full year. This can help minimize scarring. Exposure to ultraviolet light in the first year will darken the scar. It can take 1-2 years for the scar to lose its redness and to heal completely.  SEEK IMMEDIATE MEDICAL CARE IF:  You have redness, pain, or swelling around the wound.   You see ayellowish-white fluid (pus) coming from the wound.   You have chills or a fever.  MAKE SURE YOU:  Understand these instructions.  Will watch your condition.  Will get help right away if you are not doing well or get worse. Document Released: 12/13/2004 Document Revised: 08/26/2013 Document Reviewed: 06/18/2013 [facility] Patient Information 2015 [place], [place]. This information is not intended to replace advice given to you by your health care provider. Make sure you discuss any questions you have with your health care provider.  Head Injury You have received a head injury. It does not appear serious at this time. Headaches and vomiting are common following head injury. It should be easy to awaken from sleeping. Sometimes it is necessary for you to stay in the emergency department for a while for observation. Sometimes admission to the hospital may be needed. After injuries such as yours, most problems occur within the first 24 hours, but side effects may occur up to 7-10 days after the injury. It is important for you to carefully monitor your condition and contact your health care provider or seek immediate medical care if there is a change in your condition. WHAT ARE THE TYPES OF HEAD INJURIES? Head injuries can be as minor as a bump. Some head injuries can be more severe. More severe head injuries include:  A jarring injury to the brain  (concussion).  A bruise of the brain (contusion). This mean there is bleeding in the brain that can cause swelling.  A cracked skull (skull fracture).  Bleeding in the brain that collects, clots, and forms a bump (hematoma). WHAT CAUSES A HEAD INJURY? A serious head injury is most likely to happen to someone who is in a car wreck and is not wearing a seat belt. Other causes of major head injuries include bicycle or motorcycle accidents, sports injuries, and falls. HOW ARE HEAD INJURIES DIAGNOSED? A complete history of the event leading to the injury and your current symptoms will be helpful in diagnosing head injuries. Many times, pictures of the brain, such as CT or MRI are needed to see the extent of the injury. Often, an overnight hospital stay is necessary for observation.  WHEN SHOULD I SEEK IMMEDIATE MEDICAL CARE?  You should get help right away if:  You have  confusion or drowsiness.  You feel sick to your stomach (nauseous) or have continued, forceful vomiting.  You have dizziness or unsteadiness that is getting worse.  You have severe, continued headaches not relieved by medicine. Only take over-the-counter or prescription medicines for pain, fever, or discomfort as directed by your health care provider.  You do not have normal function of the arms or legs or are unable to walk.  You notice changes in the black spots in the center of the colored part of your eye (pupil).  You have a clear or bloody fluid coming from your nose or ears.  You have a loss of vision. During the next 24 hours after the injury, you must stay with someone who can watch you for the warning signs. This person should contact local emergency services (911 in the [place] if you have seizures, you become unconscious, or you are unable to wake up. HOW CAN I PREVENT A HEAD INJURY IN THE FUTURE? The most important factor for preventing major head injuries is avoiding motor vehicle accidents. To minimize the  potential for damage to your head, it is crucial to wear seat belts while riding in motor vehicles. Wearing helmets while bike riding and playing collision sports (like football) is also helpful. Also, avoiding dangerous activities around the house will further help reduce your risk of head injury.  WHEN CAN I RETURN TO NORMAL ACTIVITIES AND ATHLETICS? You should be reevaluated by your health care provider before returning to these activities. If you have any of the following symptoms, you should not return to activities or contact sports until 1 week after the symptoms have stopped:  Persistent headache.  Dizziness or vertigo.  Poor attention and concentration.  Confusion.  Memory problems.  Nausea or vomiting.  Fatigue or tire easily.  Irritability.  Intolerant of bright lights or loud noises.  Anxiety or depression.  Disturbed sleep. MAKE SURE YOU:   Understand these instructions.  Will watch your condition.  Will get help right away if you are not doing well or get worse. Document Released: 11/05/2005 Document Revised: 11/10/2013 Document Reviewed: 07/13/2013 [facility] Patient Information 2015 [place], [place]. This information is not intended to replace advice given to you by your health care provider. Make sure you discuss any questions you have with your health care provider.

## 2014-11-03 NOTE — ED Notes (Signed)
Pt reports he fell on back porch and struck forehead for unknown reason, denies LOC but states "I only remember it vaguely." Has laceration to forehead with dressing, abrasion to nose, and c-collar in place, reports pain in left posterior neck.

## 2014-11-03 NOTE — ED Provider Notes (Signed)
CSN: [id number]     Arrival date & time 11/03/14  1547 History  This chart was scribed for Robert Blade, MD by [name], ED Scribe. This patient was seen in room APA04/APA04 and the patient's care was started at 4:06 PM.    Chief Complaint  Patient presents with  . Fall  . Head Injury   Patient is a 68 y.o. male presenting with fall and head injury. The history is provided by the patient. No language interpreter was used.  Fall  Head Injury    HPI Comments: Robert Carey is a 68 y.o. male with past medical history of seizures, dementia who presents to the Emergency Department complaining of head injury after a fall. Patient reports that he fell on the back porch and struck his head just PTA; he denies LOC but reports that he "only remembers it vaguely." His fall was unwitnessed. He returned to standing on his own; he normally ambulates without a walker or cane. He denies new neck pain. He denies seizures today; he is unsure of the last time he had a seizure.  He called his name is to bring him here, because he could not stop the bleeding from his wound.  History per wife is at the patient has poor short and long-term memory.  He has not had a seizure in many months.  He has fallen several times in the past few months; he reports that his balance is not good as of late. He does not use oxygen at home. He is a nonsmoker. His last TDAP was given 10/04/14.   His PCP is Asencion Noble, MD.   Past Medical History  Diagnosis Date  . Diverticulosis of colon (without mention of hemorrhage)   . Esophageal reflux   . Hiatal hernia   . Stricture and stenosis of esophagus   . Scleroderma   . Motility disorder, esophageal   . Gastroparesis   . CREST syndrome   . Raynauds phenomenon   . Seizure disorder   . Dementia   . Spondylitis, ankylosing   . Mixed connective tissue disease    Past Surgical History  Procedure Laterality Date  . Toe surgery      right great toe  . Thumb  arthroscopy Left   . Wisdom tooth extraction    . Nose surgery  2005   Family History  Problem Relation Age of Onset  . Breast cancer Mother   . Pancreatic cancer Mother   . Diabetes Mother   . Heart disease Mother   . Heart disease Father   . Colon cancer Neg Hx   . COPD Father    History  Substance Use Topics  . Smoking status: Never Smoker   . Smokeless tobacco: Never Used  . Alcohol Use: No    Review of Systems  Skin: Positive for wound.  All other systems reviewed and are negative.   Allergies  Phenytoin sodium extended  Home Medications   Prior to Admission medications   Medication Sig Start Date End Date Taking? Authorizing Provider  ACIPHEX 20 MG tablet TAKE ONE TABLET BY MOUTH ONCE DAILY. 04/16/14   Irene Shipper, MD  aspirin EC 81 MG tablet Take 81 mg by mouth daily.    Historical Provider, MD  Calcium Carbonate-Vitamin D (CALCARB 600/D PO) Take 1 tablet by mouth 2 (two) times daily.     Historical Provider, MD  celecoxib (CELEBREX) 200 MG capsule Take 200 mg by mouth daily.    Historical  Provider, MD  lacosamide (VIMPAT) 200 MG TABS tablet Take 200-300 mg by mouth 2 (two) times daily. Take 300mg  daily in the morning and 200mg  in the evening    Historical Provider, MD  levETIRAcetam (KEPPRA) 250 MG tablet Take 500 mg by mouth every evening. *Takes two 250mg  tablets in addition to two 750mg  tablets for a total of 2000mg  daily in the evening at 7pm. Takes two 750mg  tablets in the morning for a total of 1500mg  daily in the morning*    Historical Provider, MD  levETIRAcetam (KEPPRA) 750 MG tablet Take 1,500-2,000 mg by mouth See admin instructions. Take 1500 mg every morning and 2000mg  at bedtime. *Takes two 250mg  tablets in addition to two 750mg  tablets for a total of 2000mg  daily in the evening at 7pm. Takes two 750mg  tablets in the morning for a total of 1500mg  daily in the morning*    Historical Provider, MD  Multiple Vitamin (MULTIVITAMIN) capsule Take 1 capsule by  mouth daily.    Historical Provider, MD  NIFEdipine (PROCARDIA XL/ADALAT-CC) 60 MG 24 hr tablet Take 60 mg by mouth daily.    Historical Provider, MD  omeprazole (PRILOSEC OTC) 20 MG tablet Take 20 mg by mouth at bedtime.     Historical Provider, MD  predniSONE (DELTASONE) 5 MG tablet Take 5 mg by mouth daily.    Historical Provider, MD  risedronate (ACTONEL) 35 MG tablet Take 35 mg by mouth every Saturday. with water on empty stomach, nothing by mouth or lie down for next 30 minutes.    Historical Provider, MD   BP 165/97 mmHg  Pulse 85  Temp(Src) 97 F (36.1 C) (Oral)  Resp 16  Ht 5\' 11"  (1.803 m)  Wt 150 lb (68.04 kg)  BMI 20.93 kg/m2  SpO2 100% Physical Exam  Constitutional: He is oriented to person, place, and time. He appears well-developed and well-nourished.  HENT:  Head: Normocephalic and atraumatic.  Right Ear: External ear normal.  Left Ear: External ear normal.  Both TMs normal; no blood  Eyes: Conjunctivae and EOM are normal. Pupils are equal, round, and reactive to light.  Neck: Normal range of motion and phonation normal. Neck supple.  Cardiovascular: Normal rate, regular rhythm and normal heart sounds.   Distal fingers are cyanotic bilaterally   Pulmonary/Chest: Effort normal and breath sounds normal. He exhibits no bony tenderness.  Abdominal: Soft. There is no tenderness.  Musculoskeletal: Normal range of motion.  Neurological: He is alert and oriented to person, place, and time. No cranial nerve deficit or sensory deficit. He exhibits normal muscle tone. Coordination normal.  Skin: Skin is warm, dry and intact.  Complex midforehead laceration; bleeding during exam Abrasion on right anterior lower shin; no associated tenderness or swelling  Psychiatric: He has a normal mood and affect. His behavior is normal. Judgment and thought content normal.  Nursing note and vitals reviewed.   ED Course  Procedures   DIAGNOSTIC STUDIES: Oxygen Saturation is 100% on RA,  normal by my interpretation.    COORDINATION OF CARE:  Medications - No data to display   Patient Vitals for the past 24 hrs:  BP Temp Temp src Pulse Resp SpO2 Height Weight  11/03/14 1555 165/97 mmHg 97 F (36.1 C) Oral 85 16 100 % 5\' 11"  (1.803 m) 150 lb (68.04 kg)   4:15 PM Discussed treatment plan with pt at bedside and pt agreed to plan.   LACERATION REPAIR Performed by: Robert Carey Consent: Verbal consent obtained. Risks and benefits:  risks, benefits and alternatives were discussed Patient identity confirmed: provided demographic data Time out performed prior to procedure Prepped and Draped in normal sterile fashion Wound explored Laceration Location: upper forehead/frontal scalp, midline; complex architecture, more cranial aspect has some tissue loss, with deep abrasion; wound edges are amenable to close the defect with light tension. Laceration Length: 5.0 cm No Foreign Bodies seen or palpated- copious irrigation with water. Anesthesia: local infiltration Local anesthetic: lidocaine 2% with epinephrine Anesthetic total: 8 ml Irrigation method: syringe Amount of cleaning: standard Skin closure: 5-0 Prolene Number of sutures or staples: 7 Technique: Simple Patient tolerance: Patient tolerated the procedure well with no immediate complications.  6:45 PM Reevaluation with update and discussion. After initial assessment and treatment, an updated evaluation reveals He remains alert and calm. Findings discussed with pt. And his wife. Reviewed hx and similar recent event.  His wife states that he is at his baseline at this time. He has Neurology f/u in 3 weeks.. [place] Review Labs Reviewed - No data to display  Imaging Review No results found.    EKG Interpretation  Date/Time:    Ventricular Rate:    PR Interval:    QRS Duration:   QT Interval:    QTC Calculation:   R Axis:     Text Interpretation:          MDM   Final diagnoses:  Fall,  initial encounter  Head injuries, initial encounter  Facial laceration, initial encounter   Fall, likely mechanical, induced due to poor balance, a chronic problem for him.  Head injury with facial and scalp laceration, without serious injury.  Chronic neck arthritis.  He is stable for discharge  Nursing Notes Reviewed/ Care Coordinated Applicable Imaging Reviewed Interpretation of Laboratory Data incorporated into ED treatment  The patient appears reasonably screened and/or stabilized for discharge and I doubt any other medical condition or other [facility] requiring further screening, evaluation, or treatment in the ED at this time prior to discharge.  Plan: Home Medications- usual; Home Treatments- Watch for progression of sx; return here if the recommended treatment, does not improve the symptoms; Recommended follow up- PCP suture removal 6 days   I personally performed the services described in this documentation, which was scribed in my presence. The recorded information has been reviewed and is accurate.       Robert Blade, MD 11/03/14 [phone]

## 2014-11-30 ENCOUNTER — Telehealth: Payer: Self-pay | Admitting: Internal Medicine

## 2014-12-02 ENCOUNTER — Ambulatory Visit (HOSPITAL_COMMUNITY)
Admission: RE | Admit: 2014-12-02 | Discharge: 2014-12-02 | Disposition: A | Discharge status: Home / Self Care | Payer: PPO | Source: Ambulatory Visit | Attending: Internal Medicine | Admitting: Internal Medicine

## 2014-12-02 DIAGNOSIS — R262 Difficulty in walking, not elsewhere classified: Secondary | ICD-10-CM | POA: Insufficient documentation

## 2014-12-02 DIAGNOSIS — R2689 Other abnormalities of gait and mobility: Secondary | ICD-10-CM | POA: Insufficient documentation

## 2014-12-02 DIAGNOSIS — R29898 Other symptoms and signs involving the musculoskeletal system: Secondary | ICD-10-CM | POA: Insufficient documentation

## 2014-12-02 NOTE — Patient Instructions (Addendum)
Bridging   Slowly raise buttocks from floor, keeping stomach tight. Repeat _10___ times per set. Do __1__ sets per session. Do __2Functional Quadriceps: Sit to Stand   Sit on edge of chair, feet flat on floor. Stand upright, extending knees fully. Repeat __10__ times per set. Do1 ____ sets per session. Do __2Lumbar Rotation (Non-Weight Bearing)   Feet on floor, slowly rock knees from side to side in small, pain-free range of motion. Allow lower back to rotate slightly. Repeat __10__ times per set. Do _1___ sets per session. Do ___2_ sessions per day.  http://orth.exer.us/160   Copyright  VHI. All rights reserved.  __ sessions per day.  http://orth.exer.us/734   Copyright  VHI. All rights reserved.  __ sessions per day.  http://orth.exer.us/1096   Copyright  VHI. All rights reserved.  Heel Raise: Bilateral (Standing)   Rise on balls of feet.2 Repeat ___10_ times per set. Do _1___ sets per session. Do __2__ sessions per day.  http://orth.exer.us/38   Copyright  VHI. All rights reserved.  Feet Heel-Toe "Tandem", Varied Arm Positions - Eyes Open   With eyes open, right foot directly in front of the other, arms out, look straight ahead at a stationary object. Hold ____ seconds. Repeat _2___ times per session. Do _2___ sessions per day.  Copyright  VHI. All rights reserved.

## 2014-12-02 NOTE — Therapy (Signed)
[place], [place], [address] Phone: [phone]   Fax:  [phone]  Physical Therapy Evaluation  Patient Details  Name: Robert Carey MRN: [id number] Date of Birth: February 03, 1946 Referring Provider:  Hennie Duos, MD  Encounter Date: 12/02/2014      PT End of Session - 12/02/14 1219    Visit Number 1   Number of Visits 8   Date for PT Re-Evaluation 01/31/15   Authorization Type Medicare   Authorization - Visit Number 1   Authorization - Number of Visits 8   PT Start Time 1030   PT Stop Time 1110   PT Time Calculation (min) 40 min   Equipment Utilized During Treatment Gait belt      Past Medical History  Diagnosis Date  . Diverticulosis of colon (without mention of hemorrhage)   . Esophageal reflux   . Hiatal hernia   . Stricture and stenosis of esophagus   . Scleroderma   . Motility disorder, esophageal   . Gastroparesis   . CREST syndrome   . Raynauds phenomenon   . Seizure disorder   . Dementia   . Spondylitis, ankylosing   . Mixed connective tissue disease     Past Surgical History  Procedure Laterality Date  . Toe surgery      right great toe  . Thumb arthroscopy Left   . Wisdom tooth extraction    . Nose surgery  2005    There were no vitals taken for this visit.  Visit Diagnosis:  No diagnosis found.      Subjective Assessment - 12/02/14 1025    Symptoms [name] states that he has noticed that his balance is off and he has been falling lately.   Both falls he has been tripping. He has been referred to physical therapy to try and improve his stabilty.   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 no problem    Patient Stated Goals Pt wants to help his balance    Currently in Pain? No/denies           [facility] PT Assessment - 12/02/14 1035    Assessment   Medical Diagnosis falls   Onset Date 09/19/14   Next MD Visit 02/18/2015   Prior Therapy none   Precautions   Precautions Fall   Balance Screen   Has the patient fallen in the past 6 months Yes   How many times? 2   Has the patient had a decrease in activity level because of a fear of falling?  No   Is the patient reluctant to leave their home because of a fear of falling?  No   Prior Function   Level of Independence Independent with basic ADLs   Leisure no hobbies   Cognition   Overall Cognitive Status Within Functional Limits for tasks assessed   Strength   Right Hip Flexion 5/5   Right Hip Extension 3-/5   Right Hip ABduction 4/5   Left Hip Flexion 5/5   Left Hip Extension 3-/5   Left Hip ABduction 4/5   Right Knee Flexion 5/5   Right Knee Extension 5/5   Left Knee Flexion 5/5   Left Knee Extension 5/5   Right Ankle Dorsiflexion  4/5   Left Ankle Dorsiflexion 5/5   Berg Balance Test   Sit to Stand Able to stand without using hands and stabilize independently   Standing Unsupported Able to stand safely 2 minutes   Sitting with Back Unsupported but Feet Supported on Floor or Stool Able to sit safely and securely 2 minutes   Stand to Sit Sits safely with minimal use of hands   Transfers Able to transfer safely, definite need of hands   Standing Unsupported with Eyes Closed Able to stand 10 seconds with supervision   Standing Ubsupported with Feet Together Able to place feet together independently and stand for 1 minute with supervision   From Standing, Reach Forward with Outstretched Arm Can reach forward >12 cm safely (5")   From Standing Position, Pick up Object from Floor Unable to try/needs assist to keep balance   From Standing Position, Turn to Look Behind Over each Shoulder Turn sideways only but maintains balance   Turn 360 Degrees Able to turn 360 degrees safely but slowly   Standing Unsupported,  Alternately Place Feet on Step/Stool Able to stand independently and safely and complete 8 steps in 20 seconds   Standing Unsupported, One Foot in Front Able to plae foot ahead of the other independently and hold 30 seconds   Standing on One Leg Tries to lift leg/unable to hold 3 seconds but remains standing independently   Total Score 40   Functional Gait  Assessment   Gait assessed  --                            PT Short Term Goals - 12/02/14 1222    PT SHORT TERM GOAL #1   Title I in HEP   Time 2   Period Weeks   PT SHORT TERM GOAL #2   Title Berg improved by 5 levels to decrease risk of falling   Time 2   Period Weeks           PT Long Term Goals - 12/02/14 1223    PT LONG TERM GOAL #1   Title I in advance HEP   Time 4   Period Weeks   PT LONG TERM GOAL #2   Title Pt to be walking 15 minutes a day at least three times a week for better health habitsl   Time 4   Period Weeks   PT LONG TERM GOAL #3   Title Berg score to be improved by 10 pts to allow pt to be safe to walk without the use of a cane.    Time 4   Period Weeks   PT LONG TERM GOAL #4   Title Pt mm strength improved by one level to improve power for sit to stand and stairclimbing activity.               Plan - 12/02/14 1219    Clinical Impression Statement Pt is a 69 yo male who is being referred for frequent falls.  Examination demonstrates decreased flexibility,decreased balance and weak LE mm.  [name] will benefit from skilled therapy to maximize his functinal ability and decrease his risk of falling.  Pt was advised that he should be using a cane to ambulate with but pt does not desire to do so at this time.     Pt will benefit from skilled therapeutic intervention in order to improve on the following deficits Decreased activity tolerance;Postural dysfunction;Improper body  mechanics;Increased fascial restricitons;Decreased strength;Difficulty walking;Decreased balance   Rehab  Potential Good   PT Frequency 2x / week   PT Duration 4 weeks   PT Treatment/Interventions Gait training;Therapeutic activities;Neuromuscular re-education;Therapeutic exercise;Balance training   PT Next Visit Plan Begin SLS, tandem and retro gait, side stepping, stepping over hurdles, marching progress to foam activities    PT Home Exercise Plan given          G-Codes - December 09, 2014 1225    Functional Limitation Mobility: Walking and moving around   Mobility: Walking and Moving Around Current Status [phone]) At least 40 percent but less than 60 percent impaired, limited or restricted   Mobility: Walking and Moving Around Goal Status [phone]) At least 20 percent but less than 40 percent impaired, limited or restricted       Problem List Patient Active Problem List   Diagnosis Date Noted  . Cervical pain 11/18/2012  . Ankylosing spondylitis 11/18/2012  . Esophageal motility disorder 01/22/2012  . GERD (gastroesophageal reflux disease) 01/22/2012  . GERD 03/08/2009  . DEMENTIA 03/07/2009  . RAYNAUD'S SYNDROME 03/07/2009  . ESOPHAGEAL STRICTURE 03/07/2009  . ESOPHAGEAL MOTILITY DISORDER 03/07/2009  . GASTROPARESIS 03/07/2009  . HIATAL HERNIA 03/07/2009  . DIVERTICULOSIS, COLON 03/07/2009  . SCLERODERMA 03/07/2009  . CREST SYNDROME 03/07/2009    RUSSELL,CINDY PT 12/09/14, 12:26 PM  [place] [address] [place], [place], [address] Phone: [phone]   Fax:  [phone]

## 2014-12-08 ENCOUNTER — Ambulatory Visit (HOSPITAL_COMMUNITY)
Admission: RE | Admit: 2014-12-08 | Discharge: 2014-12-08 | Disposition: A | Discharge status: Home / Self Care | Payer: PPO | Source: Ambulatory Visit | Attending: Internal Medicine | Admitting: Internal Medicine

## 2014-12-08 DIAGNOSIS — R29898 Other symptoms and signs involving the musculoskeletal system: Secondary | ICD-10-CM

## 2014-12-08 DIAGNOSIS — R2689 Other abnormalities of gait and mobility: Secondary | ICD-10-CM | POA: Diagnosis not present

## 2014-12-08 DIAGNOSIS — R262 Difficulty in walking, not elsewhere classified: Secondary | ICD-10-CM

## 2014-12-08 NOTE — Patient Instructions (Signed)
Abduction: Clam (Eccentric) - Side-Lying   Lie on side with knees bent. Lift top knee, keeping feet together. Keep trunk steady. Slowly lower for 3-5 seconds. 10 reps per set, 2 sets per day, every days per week.  Copyright  VHI. All rights reserved.

## 2014-12-08 NOTE — Telephone Encounter (Signed)
Spoke with Trip at [facility] who ran patient's Aciphex rx which went through with no Prior Auth required.  Spoke with Patient's wife who said that was only a temporary supply but subsequent refills would require a PA.  She stated insurance company was faxing me the form to fill out to initiate this.  I told her I would call her with the results of this process.  She acknowledged and understood.

## 2014-12-08 NOTE — Therapy (Signed)
[place], [place], [address] Phone: [phone]   Fax:  [phone]  Physical Therapy Treatment  Patient Details  Name: Robert Carey MRN: [id number] Date of Birth: 06-Dec-1945 Referring Provider:  Hennie Duos, MD  Encounter Date: 12/08/2014      PT End of Session - 12/08/14 1107    Visit Number 2   Number of Visits 8   Date for PT Re-Evaluation 01/31/15   Authorization Type Medicare   Authorization - Visit Number 2   Authorization - Number of Visits 8   PT Start Time 1103   PT Stop Time 1145   PT Time Calculation (min) 42 min   Equipment Utilized During Treatment Gait belt   Activity Tolerance Patient tolerated treatment well   Behavior During Therapy [facility] for tasks assessed/performed      Past Medical History  Diagnosis Date  . Diverticulosis of colon (without mention of hemorrhage)   . Esophageal reflux   . Hiatal hernia   . Stricture and stenosis of esophagus   . Scleroderma   . Motility disorder, esophageal   . Gastroparesis   . CREST syndrome   . Raynauds phenomenon   . Seizure disorder   . Dementia   . Spondylitis, ankylosing   . Mixed connective tissue disease     Past Surgical History  Procedure Laterality Date  . Toe surgery      right great toe  . Thumb arthroscopy Left   . Wisdom tooth extraction    . Nose surgery  2005    There were no vitals taken for this visit.  Visit Diagnosis:  Poor balance  Bilateral leg weakness  Difficulty walking      Subjective Assessment - 12/08/14 1104    Symptoms patient notes only minor neck pain not likely related to falls. Patient steates falls occurred durign stepping up and down stairs. Patient's wife also present noting that he is very stiff in both his movements and mobility.    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 Lackey states that he has noticed that his balance is  off and he has been falling lately.   Both falls he has been tripping. He has been referred to physical therapy to try and improve his stabilty.   Pt belongs to the [facility] and tries to swim once a week and goes to the silver sneaker once a wee,    Currently in Pain? No/denies          [facility] PT Assessment - 12/08/14 0001    Strength   Right Hip ABduction --  3+   Left Hip ABduction --  4-                  [facility] Adult PT Treatment/Exercise - 12/08/14 0001    Lumbar Exercises: Seated   Other Seated Lumbar Exercises Alternating hip and shoulder flexion 10x   Other Seated Lumbar Exercises alternating trunk rotation 10x   Lumbar Exercises: Sidelying   Clam 15 reps   Knee/Hip Exercises: Standing   SLS with LE cross body reach 5x each   Gait Training Retro gait 66ft,  side stepping gait 30 ft each way.    Other Standing Knee Exercises Alternating toe tap to 4" box 20x (first 10 1 UE assist, 2nd set with no UE assist therapist CGA)   Knee/Hip Exercises: Supine   Bridges Strengthening;Both;15 reps   Bridges Limitations Second  set of bridges: bridge position held with alternating bent knee raise 10x, Bridge with alternating hip drops 5x each   Other Supine Knee Exercises trunk rotation in hooklaying 10x   Balance Exercises   Step Over Hurdles / Cones 3 laps 4 6" hurdles, step over (24 step overs)   Tandem Stance 30 secs;2 reps;Eyes open   Tandem Stance Limitations CGA                PT Education - 12/08/14 1108    Education provided Yes   Education Details HEP reviewed and patient educated on tandem gait/standing with arms in vs out   Person(s) Educated Patient   Methods Explanation;Demonstration;Handout   Comprehension Verbalized understanding;Returned demonstration          PT Short Term Goals - 12/02/14 1222    PT SHORT TERM GOAL #1   Title I in HEP   Time 2   Period Weeks   PT SHORT TERM GOAL #2   Title Berg improved by 5 levels to decrease risk of falling    Time 2   Period Weeks           PT Long Term Goals - 12/02/14 1223    PT LONG TERM GOAL #1   Title I in advance HEP   Time 4   Period Weeks   PT LONG TERM GOAL #2   Title Pt to be walking 15 minutes a day at least three times a week for better health habitsl   Time 4   Period Weeks   PT LONG TERM GOAL #3   Title Berg score to be improved by 10 pts to allow pt to be safe to walk without the use of a cane.    Time 4   Period Weeks   PT LONG TERM GOAL #4   Title Pt mm strength improved by one level to improve power for sit to stand and stairclimbing activity.               Plan - 12/08/14 1150    Clinical Impression Statement Patient presents with notable weakness in core, gluteal, and hip ABD muscle groups as well as unsteadiness with activitise in narrow BOS and cross-midline tasks. Patient also demonstrates mild difficulty with retro-gait but is able to well control pace and step length well. Patientt also demonstrates reduced proprioception as well as greater weakness R leg as compared to L. Required clarification of tandem stance portion of HEP, and clamshell HEP added on this date.    Pt will benefit from skilled therapeutic intervention in order to improve on the following deficits Decreased activity tolerance;Postural dysfunction;Improper body mechanics;Increased fascial restricitons;Decreased strength;Difficulty walking;Decreased balance   Rehab Potential Good   PT Next Visit Plan SLS and cross-midline tasks; increase hurdle height; incorporate pelvic and core strengthening techniques; reinforce tandem stance technique   Consulted and Agree with Plan of Care Patient        Problem List Patient Active Problem List   Diagnosis Date Noted  . Cervical pain 11/18/2012  . Ankylosing spondylitis 11/18/2012  . Esophageal motility disorder 01/22/2012  . GERD (gastroesophageal reflux disease) 01/22/2012  . GERD 03/08/2009  . DEMENTIA 03/07/2009  . RAYNAUD'S  SYNDROME 03/07/2009  . ESOPHAGEAL STRICTURE 03/07/2009  . ESOPHAGEAL MOTILITY DISORDER 03/07/2009  . GASTROPARESIS 03/07/2009  . HIATAL HERNIA 03/07/2009  . DIVERTICULOSIS, COLON 03/07/2009  . SCLERODERMA 03/07/2009  . CREST SYNDROME 03/07/2009   Devona Konig PT DPT [id number]   Deniece Ree PT,  DPT  [place], [place], [address] Phone: [phone]   Fax:  [phone]

## 2014-12-09 ENCOUNTER — Ambulatory Visit (HOSPITAL_COMMUNITY)
Admission: RE | Admit: 2014-12-09 | Discharge: 2014-12-09 | Disposition: A | Discharge status: Home / Self Care | Payer: PPO | Source: Ambulatory Visit | Attending: Internal Medicine | Admitting: Internal Medicine

## 2014-12-09 DIAGNOSIS — R2689 Other abnormalities of gait and mobility: Secondary | ICD-10-CM

## 2014-12-09 DIAGNOSIS — R29898 Other symptoms and signs involving the musculoskeletal system: Secondary | ICD-10-CM

## 2014-12-09 DIAGNOSIS — R262 Difficulty in walking, not elsewhere classified: Secondary | ICD-10-CM

## 2014-12-09 NOTE — Therapy (Signed)
[place], [place], [address] Phone: [phone]   Fax:  [phone]  Physical Therapy Treatment  Patient Details  Name: Robert Carey MRN: [id number] Date of Birth: Mar 28, 1946 Referring Provider:  Hennie Duos, MD  Encounter Date: 12/09/2014      PT End of Session - 12/09/14 1648    Visit Number 3   Number of Visits 8   Date for PT Re-Evaluation 01/31/15   Authorization Type Medicare   Authorization - Visit Number 3   Authorization - Number of Visits 8   PT Start Time 2585   PT Stop Time 1645   PT Time Calculation (min) 46 min   Equipment Utilized During Treatment Gait belt   Activity Tolerance Patient tolerated treatment well   Behavior During Therapy [facility] for tasks assessed/performed      Past Medical History  Diagnosis Date  . Diverticulosis of colon (without mention of hemorrhage)   . Esophageal reflux   . Hiatal hernia   . Stricture and stenosis of esophagus   . Scleroderma   . Motility disorder, esophageal   . Gastroparesis   . CREST syndrome   . Raynauds phenomenon   . Seizure disorder   . Dementia   . Spondylitis, ankylosing   . Mixed connective tissue disease     Past Surgical History  Procedure Laterality Date  . Toe surgery      right great toe  . Thumb arthroscopy Left   . Wisdom tooth extraction    . Nose surgery  2005    There were no vitals taken for this visit.  Visit Diagnosis:  Poor balance  Bilateral leg weakness  Difficulty walking      Subjective Assessment - 12/09/14 1622    Symptoms Pt reports only mild neck pain today, no pain anywhere else.  No falls since starting therapy.    Currently in Pain? Yes   Pain Score 3    Pain Location Neck                    [facility] Adult PT Treatment/Exercise - 12/09/14 1600    Exercises   Exercises Lumbar;Knee/Hip   Lumbar Exercises: Seated   Sit to Stand 10 reps   Sit to Stand Limitations 2x5, with  eccentric lowering, no UE   Other Seated Lumbar Exercises alternating trunk rotation, side bend 10x   Lumbar Exercises: Supine   Dead Bug 10 reps   Dead Bug Limitations with tactile assist for correct pattern   Bridge Limitations see below   Lumbar Exercises: Sidelying   Clam 10 reps   Clam Limitations RTB   Knee/Hip Exercises: Supine   Bridges 3 sets;10 reps   Bridges Limitations x10 regular, x10 with march while in bridge, x10 with hip drop   Other Supine Knee Exercises LTR with opposite head turn x5, with manual assist for increased lower trunk rotation   Balance Exercises: Standing   Tandem Stance 15 secs;2 reps;Intermittent HHA;Eyes open   Tandem Stance Limitations Balance Beam   Retro Gait 2 reps;Theraband   Theraband Level (Retro Gait) Level 2 (Red)   Sidestepping 2 reps;Theraband   Theraband Level (Sidestepping) Level 2 (Red)   Step Over Hurdles / Cones 12" forward step together/reciprocal, side step together (decreasing festerinating steps between trials)   Balance Exercises   Rotation with Cones Airex, high -> low rotation with Lt UE, and low ->low with Rt UE (hx of shoulder problems  on Rt and inability to lift arm)                PT Education - 12/08/14 1108    Education provided Yes   Education Details HEP reviewed and patient educated on tandem gait/standing with arms in vs out   Person(s) Educated Patient   Methods Explanation;Demonstration;Handout   Comprehension Verbalized understanding;Returned demonstration          PT Short Term Goals - 12/02/14 1222    PT SHORT TERM GOAL #1   Title I in HEP   Time 2   Period Weeks   PT SHORT TERM GOAL #2   Title Berg improved by 5 levels to decrease risk of falling   Time 2   Period Weeks           PT Long Term Goals - 12/02/14 1223    PT LONG TERM GOAL #1   Title I in advance HEP   Time 4   Period Weeks   PT LONG TERM GOAL #2   Title Pt to be walking 15 minutes a day at least three times a week  for better health habitsl   Time 4   Period Weeks   PT LONG TERM GOAL #3   Title Berg score to be improved by 10 pts to allow pt to be safe to walk without the use of a cane.    Time 4   Period Weeks   PT LONG TERM GOAL #4   Title Pt mm strength improved by one level to improve power for sit to stand and stairclimbing activity.               Plan - 12/09/14 1649    Clinical Impression Statement Initaited treatment, progressing strenghening, gait challenges, and balance challenges.  Pt often stated "i can't" during treatment session, prior to to attempting activity; continuous encouragement throughout treatment on attempting activities prior to stating "i can't" required.  Pt did require CGA during gait and balance challenges for safety.  Noted difficulty with stepping over hurdles in even pattern, often displaying staggering gait and multiple steps on each foot prior to clearing other foot.     Pt will benefit from skilled therapeutic intervention in order to improve on the following deficits Decreased activity tolerance;Postural dysfunction;Improper body mechanics;Increased fascial restricitons;Decreased strength;Difficulty walking;Decreased balance   Rehab Potential Good   PT Frequency 2x / week   PT Duration 4 weeks   PT Next Visit Plan SLS and cross-midline tasks; increase hurdle height; incorporate pelvic and core strengthening techniques; reinforce tandem stance technique        Problem List Patient Active Problem List   Diagnosis Date Noted  . Cervical pain 11/18/2012  . Ankylosing spondylitis 11/18/2012  . Esophageal motility disorder 01/22/2012  . GERD (gastroesophageal reflux disease) 01/22/2012  . GERD 03/08/2009  . DEMENTIA 03/07/2009  . RAYNAUD'S SYNDROME 03/07/2009  . ESOPHAGEAL STRICTURE 03/07/2009  . ESOPHAGEAL MOTILITY DISORDER 03/07/2009  . GASTROPARESIS 03/07/2009  . HIATAL HERNIA 03/07/2009  . DIVERTICULOSIS, COLON 03/07/2009  . SCLERODERMA 03/07/2009   . CREST SYNDROME 03/07/2009    Lonna Cobb, DPT [id number]  12/09/2014, 4:54 PM  [place] [address] [place], [place], [address] Phone: [phone]   Fax:  [phone]

## 2014-12-10 ENCOUNTER — Ambulatory Visit (HOSPITAL_COMMUNITY): Payer: PPO | Admitting: Physical Therapy

## 2014-12-15 ENCOUNTER — Ambulatory Visit (HOSPITAL_COMMUNITY)
Admission: RE | Admit: 2014-12-15 | Discharge: 2014-12-15 | Disposition: A | Discharge status: Home / Self Care | Payer: PPO | Source: Ambulatory Visit | Attending: Internal Medicine | Admitting: Internal Medicine

## 2014-12-15 DIAGNOSIS — R2689 Other abnormalities of gait and mobility: Secondary | ICD-10-CM

## 2014-12-15 DIAGNOSIS — R29898 Other symptoms and signs involving the musculoskeletal system: Secondary | ICD-10-CM

## 2014-12-15 DIAGNOSIS — R262 Difficulty in walking, not elsewhere classified: Secondary | ICD-10-CM

## 2014-12-15 NOTE — Therapy (Signed)
[place], [place], [address] Phone: [phone]   Fax:  [phone]  Physical Therapy Treatment  Patient Details  Name: Robert Carey MRN: [id number] Date of Birth: [birth year] Referring Provider:  Hennie Duos, MD  Encounter Date: 12/15/2014      PT End of Session - 12/15/14 1614    Visit Number 4   Number of Visits 8   Date for PT Re-Evaluation 01/31/15   Authorization Type Medicare   Authorization - Visit Number 4   Authorization - Number of Visits 8   PT Start Time 1522   PT Stop Time 1610   PT Time Calculation (min) 48 min   Equipment Utilized During Treatment Gait belt   Activity Tolerance Patient tolerated treatment well   Behavior During Therapy [facility] for tasks assessed/performed      Past Medical History  Diagnosis Date  . Diverticulosis of colon (without mention of hemorrhage)   . Esophageal reflux   . Hiatal hernia   . Stricture and stenosis of esophagus   . Scleroderma   . Motility disorder, esophageal   . Gastroparesis   . CREST syndrome   . Raynauds phenomenon   . Seizure disorder   . Dementia   . Spondylitis, ankylosing   . Mixed connective tissue disease     Past Surgical History  Procedure Laterality Date  . Toe surgery      right great toe  . Thumb arthroscopy Left   . Wisdom tooth extraction    . Nose surgery  2005    There were no vitals taken for this visit.  Visit Diagnosis:  Poor balance  Bilateral leg weakness  Difficulty walking                  [facility] Adult PT Treatment/Exercise - 12/15/14 1604    Exercises   Exercises Knee/Hip;Balance   Knee/Hip Exercises: Standing   Lateral Step Up Both;10 reps;Hand Hold: 1;Step Height: 4"   Lateral Step Up Limitations increase height next session   Functional Squat 10 reps   Functional Squat Limitations therapist facilitation for proper weight loading   SLS L 20", Rt 6" max of 5 with 1 finger  assistance   Balance Exercises   Sidestepping 2 reps;Theraband   Theraband Level (Sidestepping) Level 2 (Red)   Tandem Walking 2 round trips   Retro Gait 2 reps   Step Over Hurdles / Cones 12" forward step together/reciprocal, side step together (decreasing festerinating steps between trials)   Rotation with Cones Airex, high -> low rotation with Lt UE, and low ->low with Rt UE (hx of shoulder problems on Rt and inability to lift arm)   March on Foam/Wedge 10 reps;Limitations   March on Foam/Wedge Limitations Airex, 1 HHA,3" holds   Heel Raises Both;20 reps   Heel Raises Limitations on airex with 1 HHA   Toe Raise 20 reps   Toe Raise Limitations on airex with 1 HHA   Balance Beam 2RT                  PT Short Term Goals - 12/15/14 1558    PT SHORT TERM GOAL #1   Title I in HEP   Status On-going   PT SHORT TERM GOAL #2   Title Berg improved by 5 levels to decrease risk of falling   Status On-going           PT Long Term Goals - 12/15/14  [place] #1   Title I in advance HEP   PT LONG TERM GOAL #2   Title Pt to be walking 15 minutes a day at least three times a week for better health habitsl   PT LONG TERM GOAL #3   Title Berg score to be improved by 10 pts to allow pt to be safe to walk without the use of a cane.    PT LONG TERM GOAL #4   Title Pt mm strength improved by one level to improve power for sit to stand and stairclimbing activity.   Status On-going               Plan - 12/15/14 1614    Clinical Impression Statement Progressed balance with dynamic surface and began SLS activities.  Pt tends to stated "I can't do that" prior trying activities, continuous encouragement through session not to doubt ability.  Continued hurdles for balance and LE strengthening wiht cueing to reduce staggered gait to improve gait mechanics.  Added functional squats and lateral LE stepups for gluteal and LE strengthening.  No reports of pain through  session.     PT Next Visit Plan Continue 12in hurdles (forward reciprocal and sidestepping), SLS and crossing midline tasks.  Tandem stance on static and dynamic surfaces.  Next session begin hip and shoulder bumps for core activation with balance activities, increase step height with lateral step ups and begin forward.        Problem List Patient Active Problem List   Diagnosis Date Noted  . Cervical pain 11/18/2012  . Ankylosing spondylitis 11/18/2012  . Esophageal motility disorder 01/22/2012  . GERD (gastroesophageal reflux disease) 01/22/2012  . GERD 03/08/2009  . DEMENTIA 03/07/2009  . RAYNAUD'S SYNDROME 03/07/2009  . ESOPHAGEAL STRICTURE 03/07/2009  . ESOPHAGEAL MOTILITY DISORDER 03/07/2009  . GASTROPARESIS 03/07/2009  . HIATAL HERNIA 03/07/2009  . DIVERTICULOSIS, COLON 03/07/2009  . SCLERODERMA 03/07/2009  . CREST SYNDROME 03/07/2009   Ihor Austin, [place]  Aldona Lento 12/15/2014, 4:24 PM  [facility] [address] [place], [place], [address] Phone: [phone]   Fax:  [phone]

## 2014-12-16 NOTE — Telephone Encounter (Signed)
Spoke with pharmacy to let them know I had not yet received a prior auth form from them for patient's Aciphex.  Pharmacist is going to check on it and fax me a new one.

## 2014-12-17 ENCOUNTER — Other Ambulatory Visit (HOSPITAL_COMMUNITY): Payer: Self-pay | Admitting: Pulmonary Disease

## 2014-12-17 ENCOUNTER — Telehealth (HOSPITAL_COMMUNITY): Payer: Self-pay | Admitting: Physical Therapy

## 2014-12-17 ENCOUNTER — Ambulatory Visit (HOSPITAL_COMMUNITY)
Admission: RE | Admit: 2014-12-17 | Discharge: 2014-12-17 | Disposition: A | Discharge status: Home / Self Care | Payer: PPO | Source: Ambulatory Visit | Attending: Pulmonary Disease | Admitting: Pulmonary Disease

## 2014-12-17 ENCOUNTER — Ambulatory Visit (HOSPITAL_COMMUNITY): Payer: PPO | Admitting: Physical Therapy

## 2014-12-17 DIAGNOSIS — R079 Chest pain, unspecified: Secondary | ICD-10-CM

## 2014-12-17 DIAGNOSIS — R0789 Other chest pain: Secondary | ICD-10-CM | POA: Diagnosis present

## 2014-12-17 NOTE — Telephone Encounter (Signed)
His wife called back to say that patient has been complaining with chest pain since his last visit and she will take him to see the MD today. Wife stated she could hear him moaning from the waiting room and that she is concerned could be a pulled muscle.

## 2014-12-17 NOTE — Therapy (Signed)
12/17/2014.  Pt called and cancelled session today stating that he had increased pectoral pain following last session and is going to doctor.  Reviewing note session focus on balance activities with only activity complete with arms was cone rotation to improve crossing midline and improve balance.  There were no c/o pain throughout and following session,just stated balance activities increased difficulty. Ihor Austin, [place]

## 2014-12-17 NOTE — Telephone Encounter (Signed)
He is sick and can not come in today

## 2014-12-17 NOTE — Addendum Note (Signed)
Encounter addended by: Susy Frizzle, PTA on: 12/17/2014  9:25 AM<BR>     Documentation filed: Clinical Notes

## 2014-12-22 ENCOUNTER — Ambulatory Visit (HOSPITAL_COMMUNITY)
Admission: RE | Admit: 2014-12-22 | Discharge: 2014-12-22 | Disposition: A | Discharge status: Home / Self Care | Payer: PPO | Source: Ambulatory Visit | Attending: Internal Medicine | Admitting: Internal Medicine

## 2014-12-22 DIAGNOSIS — R29898 Other symptoms and signs involving the musculoskeletal system: Secondary | ICD-10-CM | POA: Diagnosis not present

## 2014-12-22 DIAGNOSIS — R262 Difficulty in walking, not elsewhere classified: Secondary | ICD-10-CM | POA: Diagnosis not present

## 2014-12-22 DIAGNOSIS — R2689 Other abnormalities of gait and mobility: Secondary | ICD-10-CM | POA: Insufficient documentation

## 2014-12-22 NOTE — Telephone Encounter (Signed)
Spoke with pharmacy to check on status of prior authorization for Aciphex and was told his insurance had, in fact, covered it and it had gone through.

## 2014-12-22 NOTE — Therapy (Signed)
[place], [place], [address] Phone: [phone]   Fax:  [phone]  Physical Therapy Treatment  Patient Details  Name: Robert Carey MRN: [id number] Date of Birth: [birth year] Referring Provider:  Hennie Duos, MD  Encounter Date: 12/22/2014      PT End of Session - 12/22/14 1022    Visit Number 5   Number of Visits 8   Date for PT Re-Evaluation 01/31/15   Authorization Type Medicare   Authorization - Visit Number 5   Authorization - Number of Visits 8   PT Start Time 0930   PT Stop Time 1015   PT Time Calculation (min) 45 min   Activity Tolerance Patient tolerated treatment well   Behavior During Therapy [facility] for tasks assessed/performed      Past Medical History  Diagnosis Date  . Diverticulosis of colon (without mention of hemorrhage)   . Esophageal reflux   . Hiatal hernia   . Stricture and stenosis of esophagus   . Scleroderma   . Motility disorder, esophageal   . Gastroparesis   . CREST syndrome   . Raynauds phenomenon   . Seizure disorder   . Dementia   . Spondylitis, ankylosing   . Mixed connective tissue disease     Past Surgical History  Procedure Laterality Date  . Toe surgery      right great toe  . Thumb arthroscopy Left   . Wisdom tooth extraction    . Nose surgery  2005    There were no vitals taken for this visit.  Visit Diagnosis:  Poor balance  Bilateral leg weakness  Difficulty walking                  [facility] Adult PT Treatment/Exercise - 12/22/14 0001    High Level Balance   High Level Balance Comments tandem stance on and off of foam pad, cross-midline toe taps in frontal and sagittal planes   Exercises   Exercises --  Gastroc stretch 2x30 seconds   Lumbar Exercises: Supine   Bridge 10 reps   Bridge Limitations x10 with B legs, x10 U each side   Lumbar Exercises: Sidelying   Hip Abduction 10 reps   Hip Abduction Weights (lbs) 0#,  facilitation for proper form   Knee/Hip Exercises: Standing   Forward Lunges 1 set;10 reps   Forward Lunges Limitations Poor balance throughout   Other Standing Knee Exercises Posterior wall bumps for balance reactions/mm coorrdination   Other Standing Knee Exercises Forward navigation of 12 inch hurdles with intermittent difficulty clearing obstacles, intermittent Mod(A) to maintain balance; standing hip ABD with RTB                  PT Short Term Goals - 12/15/14 1558    PT SHORT TERM GOAL #1   Title I in HEP   Status On-going   PT SHORT TERM GOAL #2   Title Berg improved by 5 levels to decrease risk of falling   Status On-going           PT Long Term Goals - 12/15/14 1601    PT LONG TERM GOAL #1   Title I in advance HEP   PT LONG TERM GOAL #2   Title Pt to be walking 15 minutes a day at least three times a week for better health habitsl   PT LONG TERM GOAL #3   Title Berg score to be improved by 10 pts  to allow pt to be safe to walk without the use of a cane.    PT LONG TERM GOAL #4   Title Pt mm strength improved by one level to improve power for sit to stand and stairclimbing activity.   Status On-going               Plan - 12/22/14 1024    Clinical Impression Statement Patient came to appointment with his wife, both of whome stated concern regarding the pain patient expereineced in his chest pain after last session; patient and wife did go to MD after pain did not reduce and cardiac involvement was ruled out. Family stated that patient's rheumatologist is very wary of effects of PT; education provided regarding need for graded mobility to promote function without overloading patient, as reduced activity will likely make condition worse. Patient and family education regarding possible effects of gross trunk immobility in relation to medical diagnoses and possible origins of pain. Patient also c/o significant pain in B balls of feet with heel raise exercise ;  balls of feet inspected and B painful callusses found, very tender to touch. Patient advised to perform heel raises on cushioned surface, or, if this continues to be problematic, to perform exercise NWB with exercise band supplied by therapy staff in order to allow areas to heal in face of reduced circulation. Patient tolerated session well but showed general LOB and unsteadiness with balance and functional strengthening tasks   Pt will benefit from skilled therapeutic intervention in order to improve on the following deficits Decreased activity tolerance;Postural dysfunction;Improper body mechanics;Increased fascial restricitons;Decreased strength;Difficulty walking;Decreased balance   Rehab Potential Good   PT Frequency 2x / week   PT Duration 4 weeks   PT Treatment/Interventions Gait training;Therapeutic activities;Neuromuscular re-education;Therapeutic exercise;Balance training   PT Next Visit Plan Check status of painful areas on balls of feet, hurdles, SLS, crossing midline, step ups (forward and lateral)   Consulted and Agree with Plan of Care Patient;Family member/caregiver        Problem List Patient Active Problem List   Diagnosis Date Noted  . Cervical pain 11/18/2012  . Ankylosing spondylitis 11/18/2012  . Esophageal motility disorder 01/22/2012  . GERD (gastroesophageal reflux disease) 01/22/2012  . GERD 03/08/2009  . DEMENTIA 03/07/2009  . RAYNAUD'S SYNDROME 03/07/2009  . ESOPHAGEAL STRICTURE 03/07/2009  . ESOPHAGEAL MOTILITY DISORDER 03/07/2009  . GASTROPARESIS 03/07/2009  . HIATAL HERNIA 03/07/2009  . DIVERTICULOSIS, COLON 03/07/2009  . SCLERODERMA 03/07/2009  . CREST SYNDROME 03/07/2009    Deniece Ree PT, DPT [facility] [address] [place], [place], [address] Phone: [phone]   Fax:  [phone]

## 2014-12-24 ENCOUNTER — Ambulatory Visit (HOSPITAL_COMMUNITY)
Admission: RE | Admit: 2014-12-24 | Discharge: 2014-12-24 | Disposition: A | Discharge status: Home / Self Care | Payer: PPO | Source: Ambulatory Visit | Attending: Internal Medicine | Admitting: Internal Medicine

## 2014-12-24 DIAGNOSIS — R2689 Other abnormalities of gait and mobility: Secondary | ICD-10-CM

## 2014-12-24 DIAGNOSIS — R262 Difficulty in walking, not elsewhere classified: Secondary | ICD-10-CM

## 2014-12-24 DIAGNOSIS — R29898 Other symptoms and signs involving the musculoskeletal system: Secondary | ICD-10-CM

## 2014-12-24 NOTE — Therapy (Signed)
[place], [place], [address] Phone: [phone]   Fax:  [phone]  Physical Therapy Treatment  Patient Details  Name: Robert Carey MRN: [id number] Date of Birth: 1946/09/22 Referring Provider:  Hennie Duos, MD  Encounter Date: 12/24/2014      PT End of Session - 12/24/14 1016    Visit Number 6   Number of Visits 8   Date for PT Re-Evaluation 01/31/15   Authorization Type Medicare   Authorization - Visit Number 6   Authorization - Number of Visits 8   PT Start Time 0933   PT Stop Time 1015   PT Time Calculation (min) 42 min   Equipment Utilized During Treatment Gait belt   Activity Tolerance Patient tolerated treatment well   Behavior During Therapy [facility] for tasks assessed/performed      Past Medical History  Diagnosis Date  . Diverticulosis of colon (without mention of hemorrhage)   . Esophageal reflux   . Hiatal hernia   . Stricture and stenosis of esophagus   . Scleroderma   . Motility disorder, esophageal   . Gastroparesis   . CREST syndrome   . Raynauds phenomenon   . Seizure disorder   . Dementia   . Spondylitis, ankylosing   . Mixed connective tissue disease     Past Surgical History  Procedure Laterality Date  . Toe surgery      right great toe  . Thumb arthroscopy Left   . Wisdom tooth extraction    . Nose surgery  2005    There were no vitals taken for this visit.  Visit Diagnosis:  Poor balance  Bilateral leg weakness  Difficulty walking                  [facility] Adult PT Treatment/Exercise - 12/24/14 0001    Lumbar Exercises: Stretches   Passive Hamstring Stretch 2 reps;30 seconds   Passive Hamstring Stretch Limitations Gastroc Stretch, Slantboard   Lower Trunk Rotation Other (comment)   Lower Trunk Rotation Limitations x10   Lumbar Exercises: Seated   Sit to Stand 20 reps   Sit to Stand Limitations Feet on Airex, x10 from 23 inch bed, x10 from 20  inch bed   Lumbar Exercises: Supine   Dead Bug 20 reps   Dead Bug Limitations 2# on LE   Bridge 20 reps   Knee/Hip Exercises: Standing   Lateral Step Up 10 reps;Hand Hold: 0;Step Height: 6";Both   Forward Step Up 10 reps;Hand Hold: 0;Step Height: 6";Both   SLS with Vectors Airex Balance : Feet neutral and together 10" with EC, semi-tandem 30" with EO   Gait Training Gait with 180 degree turn to Rt and Lt   Balance Exercises: Standing   Retro Gait 2 reps;Limitations   Retro Gait Limitations tandem   Sidestepping 2 reps;Limitations   Sidestepping Limitations 20 feet RT side step, 20 feet RT 1/2 carioca each   Balance Exercises   Tandem Walking 2 round trips   March on Foam/Wedge 10 reps;Limitations   March on Foam/Wedge Limitations Airex, 0 HHA                  PT Short Term Goals - 12/15/14 1558    PT SHORT TERM GOAL #1   Title I in HEP   Status On-going   PT SHORT TERM GOAL #2   Title Berg improved by 5 levels to decrease risk of falling   Status  On-going           PT Long Term Goals - 12/15/14 1601    PT LONG TERM GOAL #1   Title I in advance HEP   PT LONG TERM GOAL #2   Title Pt to be walking 15 minutes a day at least three times a week for better health habitsl   PT LONG TERM GOAL #3   Title Berg score to be improved by 10 pts to allow pt to be safe to walk without the use of a cane.    PT LONG TERM GOAL #4   Title Pt mm strength improved by one level to improve power for sit to stand and stairclimbing activity.   Status On-going               Plan - 12/24/14 1016    Clinical Impression Statement Held on HR/TR exercise today as pt still complaining about painful caluses on balls of feet; no complaints of pain in this area with other exercises today.  Pt did participate in therapy today without continuous encouragement to attempt activites prior to stating he could not.  Pt did require min guard assist -> min assist with balance activities,  particulary tandem/semi tandem activities.  Attempted semi tandem balance on Airex with EC at first, though pt unable to maintain/correct balance for >3 seconds and exercise transitioned to EO for 30 seconds instead.     Pt will benefit from skilled therapeutic intervention in order to improve on the following deficits Decreased activity tolerance;Postural dysfunction;Improper body mechanics;Increased fascial restricitons;Decreased strength;Difficulty walking;Decreased balance   Rehab Potential Good   PT Frequency 2x / week   PT Duration 4 weeks   PT Treatment/Interventions Gait training;Therapeutic activities;Neuromuscular re-education;Therapeutic exercise;Balance training   PT Next Visit Plan Check status of painful areas on balls of feet, hurdles, SLS, crossing midline, step ups (forward and lateral)        Problem List Patient Active Problem List   Diagnosis Date Noted  . Cervical pain 11/18/2012  . Ankylosing spondylitis 11/18/2012  . Esophageal motility disorder 01/22/2012  . GERD (gastroesophageal reflux disease) 01/22/2012  . GERD 03/08/2009  . DEMENTIA 03/07/2009  . RAYNAUD'S SYNDROME 03/07/2009  . ESOPHAGEAL STRICTURE 03/07/2009  . ESOPHAGEAL MOTILITY DISORDER 03/07/2009  . GASTROPARESIS 03/07/2009  . HIATAL HERNIA 03/07/2009  . DIVERTICULOSIS, COLON 03/07/2009  . SCLERODERMA 03/07/2009  . CREST SYNDROME 03/07/2009    Lonna Cobb, DPT [phone]  12/24/2014, 10:20 AM  [facility] [address] [place], [place], [address] Phone: [phone]   Fax:  [phone]

## 2014-12-29 ENCOUNTER — Ambulatory Visit (HOSPITAL_COMMUNITY)
Admission: RE | Admit: 2014-12-29 | Discharge: 2014-12-29 | Disposition: A | Discharge status: Home / Self Care | Payer: PPO | Source: Ambulatory Visit | Attending: Internal Medicine | Admitting: Internal Medicine

## 2014-12-29 DIAGNOSIS — R2689 Other abnormalities of gait and mobility: Secondary | ICD-10-CM | POA: Diagnosis not present

## 2014-12-29 DIAGNOSIS — R29898 Other symptoms and signs involving the musculoskeletal system: Secondary | ICD-10-CM

## 2014-12-29 DIAGNOSIS — R262 Difficulty in walking, not elsewhere classified: Secondary | ICD-10-CM

## 2014-12-29 NOTE — Therapy (Signed)
[place], [place], [address] Phone: [phone]   Fax:  [phone]  Physical Therapy Treatment  Patient Details  Name: Robert Carey MRN: [id number] Date of Birth: 11/21/1945 Referring Provider:  Hennie Duos, MD  Encounter Date: 12/29/2014      PT End of Session - 12/29/14 1133    Visit Number 7   Number of Visits 8   Date for PT Re-Evaluation 01/31/15   Authorization Type Medicare   Authorization - Visit Number 7   Authorization - Number of Visits 8   PT Start Time 0926   PT Stop Time 1007   PT Time Calculation (min) 41 min   Activity Tolerance Patient tolerated treatment well   Behavior During Therapy [facility] for tasks assessed/performed      Past Medical History  Diagnosis Date  . Diverticulosis of colon (without mention of hemorrhage)   . Esophageal reflux   . Hiatal hernia   . Stricture and stenosis of esophagus   . Scleroderma   . Motility disorder, esophageal   . Gastroparesis   . CREST syndrome   . Raynauds phenomenon   . Seizure disorder   . Dementia   . Spondylitis, ankylosing   . Mixed connective tissue disease     Past Surgical History  Procedure Laterality Date  . Toe surgery      right great toe  . Thumb arthroscopy Left   . Wisdom tooth extraction    . Nose surgery  2005    There were no vitals taken for this visit.  Visit Diagnosis:  Poor balance  Bilateral leg weakness  Difficulty walking      Subjective Assessment - 12/29/14 1129    Symptoms Patient reports that he did not sleep well last night, has been awake since 1am and is not feeling 100% today.    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   Both falls he has been tripping. He has been referred to physical therapy to try and improve his stabilty.   Pt belongs to  [employer] and tries to swim once a week and goes to the [employer] once a wee,                     [facility] Adult PT Treatment/Exercise - 12/29/14 0001    Lumbar Exercises: Standing   Other Standing Lumbar Exercises 3D hip excursions 1x10   Knee/Hip Exercises: Stretches   Active Hamstring Stretch 2 reps;30 seconds   Active Hamstring Stretch Limitations both legs  bilateral hamstring tightness noted   Hip Flexor Stretch 2 reps;30 seconds   Piriformis Stretch 2 reps;30 seconds   Piriformis Stretch Limitations manually by PT   Gastroc Stretch 3 reps;30 seconds   Knee/Hip Exercises: Standing   SLS 5x10 seconds each side with no assist when on R leg, one hand HHA on L leg   Other Standing Knee Exercises Forwards and sideways navigation of 6 and 12 inch hurdles; navigation between cones via side-stepping and forward stepping through narrow spaces   Other Standing Knee Exercises Cone taps in SLS with one to no hand HHA, crossing midline for cone taps                  PT Short Term Goals - 12/15/14 1558    PT  SHORT TERM GOAL #1   Title I in HEP   Status On-going   PT SHORT TERM GOAL #2   Title Berg improved by 5 levels to decrease risk of falling   Status On-going           PT Long Term Goals - 12/15/14 1601    PT LONG TERM GOAL #1   Title I in advance HEP   PT LONG TERM GOAL #2   Title Pt to be walking 15 minutes a day at least three times a week for better health habitsl   PT LONG TERM GOAL #3   Title Berg score to be improved by 10 pts to allow pt to be safe to walk without the use of a cane.    PT LONG TERM GOAL #4   Title Pt mm strength improved by one level to improve power for sit to stand and stairclimbing activity.   Status On-going               Plan - 12/29/14 1134    Clinical Impression Statement Patient continues to c/o of painful calluses on balls of his feet; calluses examined and on R foot appears to be improving but still very  tender on L. Continue to monitor. Patient did require intermittent encouragement to participate in more challenging exercises , continues to require Min guard to maintain balance during tasks. Significatn difficulty in SLS as welll as tasks that involve eyes closed, narrow BOS, continues to have some difficulty in clearing hurdles.  Tightness noted in calves, hamstrings, and piriformis muscle groups.    Pt will benefit from skilled therapeutic intervention in order to improve on the following deficits Decreased activity tolerance;Postural dysfunction;Improper body mechanics;Increased fascial restricitons;Decreased strength;Difficulty walking;Decreased balance   Rehab Potential Good   PT Frequency 2x / week   PT Duration 4 weeks   PT Treatment/Interventions Gait training;Therapeutic activities;Neuromuscular re-education;Therapeutic exercise;Balance training   PT Next Visit Plan Check status of painful areas on balls of feet, hurdles, SLS, crossing midline, step ups (forward and lateral), continue functional balance activities, include stretches as patient is very tight in hamstrings/calves/to lesser extent piriformis        Problem List Patient Active Problem List   Diagnosis Date Noted  . Cervical pain 11/18/2012  . Ankylosing spondylitis 11/18/2012  . Esophageal motility disorder 01/22/2012  . GERD (gastroesophageal reflux disease) 01/22/2012  . GERD 03/08/2009  . DEMENTIA 03/07/2009  . RAYNAUD'S SYNDROME 03/07/2009  . ESOPHAGEAL STRICTURE 03/07/2009  . ESOPHAGEAL MOTILITY DISORDER 03/07/2009  . GASTROPARESIS 03/07/2009  . HIATAL HERNIA 03/07/2009  . DIVERTICULOSIS, COLON 03/07/2009  . SCLERODERMA 03/07/2009  . CREST SYNDROME 03/07/2009    Deniece Ree PT, DPT [place] [address] [place], [place], [address] Phone: [phone]   Fax:  [phone]

## 2014-12-31 ENCOUNTER — Ambulatory Visit (HOSPITAL_COMMUNITY)
Admission: RE | Admit: 2014-12-31 | Discharge: 2014-12-31 | Disposition: A | Discharge status: Home / Self Care | Payer: PPO | Source: Ambulatory Visit | Attending: Internal Medicine | Admitting: Internal Medicine

## 2014-12-31 DIAGNOSIS — R29898 Other symptoms and signs involving the musculoskeletal system: Secondary | ICD-10-CM

## 2014-12-31 DIAGNOSIS — R2689 Other abnormalities of gait and mobility: Secondary | ICD-10-CM | POA: Diagnosis not present

## 2014-12-31 DIAGNOSIS — R262 Difficulty in walking, not elsewhere classified: Secondary | ICD-10-CM

## 2014-12-31 NOTE — Therapy (Signed)
[place], [place], [address] Phone: [phone]   Fax:  [phone]  Physical Therapy Treatment  Patient Details  Name: Robert Carey MRN: [id number] Date of Birth: [birth year] Referring Provider:  Hennie Duos, MD  Encounter Date: 12/31/2014      PT End of Session - 12/31/14 1156    Visit Number 8   Number of Visits 14   Date for PT Re-Evaluation 01/31/15   Authorization Type Medicare   Authorization - Visit Number 8   PT Start Time 2263   PT Stop Time 1150   PT Time Calculation (min) 45 min   Activity Tolerance Patient tolerated treatment well   Behavior During Therapy [facility] for tasks assessed/performed      Past Medical History  Diagnosis Date  . Diverticulosis of colon (without mention of hemorrhage)   . Esophageal reflux   . Hiatal hernia   . Stricture and stenosis of esophagus   . Scleroderma   . Motility disorder, esophageal   . Gastroparesis   . CREST syndrome   . Raynauds phenomenon   . Seizure disorder   . Dementia   . Spondylitis, ankylosing   . Mixed connective tissue disease     Past Surgical History  Procedure Laterality Date  . Toe surgery      right great toe  . Thumb arthroscopy Left   . Wisdom tooth extraction    . Nose surgery  2005    There were no vitals taken for this visit.  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 states that he has noticed that his balance is off and he has been falling lately.   Both falls he has been tripping. He has been referred to physical therapy to try and improve his stabilty.   Pt belongs to the [facility] and tries to swim once a week and goes to the silver sneaker once a wee,           [facility] PT  Assessment - 12/31/14 0001    Observation/Other Assessments   Focus on Therapeutic Outcomes (FOTO)  37% limited (44% limited at eval)   AROM   Right Hip External Rotation  --  Attempted to assess R hip ROM- pt unable to relax   Right Hip Internal Rotation  --  Attempted to assess R hip ROM- pt unable to relax   Left Hip External Rotation  40   Left Hip Internal Rotation  15   Strength   Right Hip Flexion 5/5   Right Hip Extension 2/5   Right Hip ABduction 4-/5   Left Hip Flexion 5/5   Left Hip Extension 3-/5   Left Hip ABduction 4-/5   Right Knee Flexion 3+/5   Right Knee Extension 4+/5   Left Knee Flexion 4+/5   Left Knee Extension 4+/5   Right Ankle Dorsiflexion 5/5   Left Ankle Dorsiflexion 5/5   Ambulation/Gait   Gait Comments 6 minute walk 1.1 m/s   6 Minute Walk- Baseline   6 Minute Walk- Baseline --   Berg Balance Test   Sit to Stand Able to stand without using hands and stabilize independently   Standing Unsupported Able to stand safely 2 minutes  Sitting with Back Unsupported but Feet Supported on Floor or Stool Able to sit safely and securely 2 minutes   Stand to Sit Sits safely with minimal use of hands   Transfers Able to transfer safely, minor use of hands   Standing Unsupported with Eyes Closed Able to stand 10 seconds with supervision   Standing Ubsupported with Feet Together Able to place feet together independently and stand for 1 minute with supervision   From Standing, Reach Forward with Outstretched Arm Reaches forward but needs supervision   From Standing Position, Pick up Object from Floor Able to pick up shoe, needs supervision   From Standing Position, Turn to Look Behind Over each Shoulder Turn sideways only but maintains balance   Turn 360 Degrees Able to turn 360 degrees safely but slowly   Standing Unsupported, Alternately Place Feet on Step/Stool Able to stand independently and safely and complete 8 steps in 20 seconds   Standing Unsupported, One  Foot in Front Able to plae foot ahead of the other independently and hold 30 seconds   Standing on One Leg Tries to lift leg/unable to hold 3 seconds but remains standing independently   Total Score 42                  [facility] Adult PT Treatment/Exercise - 12/31/14 0001    Knee/Hip Exercises: Stretches   Active Hamstring Stretch 3 reps;30 seconds   Active Hamstring Stretch Limitations both   Gastroc Stretch 3 reps;30 seconds   Gastroc Stretch Limitations both                  PT Short Term Goals - 12/31/14 1201    PT SHORT TERM GOAL #1   Title I in HEP   Time 2   Period Weeks   Status On-going   PT SHORT TERM GOAL #2   Title Berg improved by 5 levels to decrease risk of falling   Baseline 42 on 12/31/14   Time 2   Period Weeks   Status On-going           PT Long Term Goals - 12/31/14 1201    PT LONG TERM GOAL #1   Title I in advance HEP   Time 4   Period Weeks   Status On-going   PT LONG TERM GOAL #2   Title Pt to be walking 15 minutes a day at least three times a week for better health habitsl   Time 4   Period Weeks   Status On-going   PT LONG TERM GOAL #3   Title Berg score to be improved by 10 pts to allow pt to be safe to walk without the use of a cane.    Time 4   Period Weeks   Status On-going   PT LONG TERM GOAL #4   Title Pt mm strength improved by one level to improve power for sit to stand and stairclimbing activity.   Time 4   Status On-going               Plan - 12/31/14 1157    Clinical Impression Statement Patient continues to c/o of difficulty with balance tasks in narrow BOS, and did state that while he is doing well with his HEP, he has not been performing gait for 15 minute periods through the day as suggested by PT. Patient demonstrates good overall strength with some impairments in hip musculature, and places at 1.1 m/s with gait speed. At  this time impairments chiefly appear to be balance related.    Pt will  benefit from skilled therapeutic intervention in order to improve on the following deficits Decreased activity tolerance;Postural dysfunction;Improper body mechanics;Increased fascial restricitons;Decreased strength;Difficulty walking;Decreased balance   Rehab Potential Good   PT Frequency 2x / week   PT Duration 4 weeks   PT Treatment/Interventions Gait training;Therapeutic activities;Neuromuscular re-education;Therapeutic exercise;Balance training   PT Next Visit Plan Check status of painful areas on balls of feet, hurdles, SLS, crossing midline, step ups (forward and lateral), continue functional balance activities, include stretches as patient is very tight in hamstrings/calves/to lesser extent piriformis        Problem List Patient Active Problem List   Diagnosis Date Noted  . Cervical pain 11/18/2012  . Ankylosing spondylitis 11/18/2012  . Esophageal motility disorder 01/22/2012  . GERD (gastroesophageal reflux disease) 01/22/2012  . GERD 03/08/2009  . DEMENTIA 03/07/2009  . RAYNAUD'S SYNDROME 03/07/2009  . ESOPHAGEAL STRICTURE 03/07/2009  . ESOPHAGEAL MOTILITY DISORDER 03/07/2009  . GASTROPARESIS 03/07/2009  . HIATAL HERNIA 03/07/2009  . DIVERTICULOSIS, COLON 03/07/2009  . SCLERODERMA 03/07/2009  . CREST SYNDROME 03/07/2009    Deniece Ree PT, DPT [place] [address] [place], [place], [address] Phone: [phone]   Fax:  [phone]

## 2015-01-05 ENCOUNTER — Ambulatory Visit (HOSPITAL_COMMUNITY): Payer: PPO | Admitting: Physical Therapy

## 2015-01-05 DIAGNOSIS — R2689 Other abnormalities of gait and mobility: Secondary | ICD-10-CM | POA: Diagnosis not present

## 2015-01-05 DIAGNOSIS — R262 Difficulty in walking, not elsewhere classified: Secondary | ICD-10-CM

## 2015-01-05 DIAGNOSIS — R29898 Other symptoms and signs involving the musculoskeletal system: Secondary | ICD-10-CM

## 2015-01-05 NOTE — Therapy (Signed)
[place], [place], [address] Phone: [phone]   Fax:  [phone]  Physical Therapy Treatment  Patient Details  Name: Robert Carey MRN: [id number] Date of Birth: 1946-01-13 Referring Provider:  Hennie Duos, MD  Encounter Date: 01/05/2015      PT End of Session - 01/05/15 1722    Visit Number 9   Number of Visits 14   Date for PT Re-Evaluation 01/31/15   Authorization Type Medicare   Authorization - Visit Number 9   PT Start Time 1520   PT Stop Time 1602   PT Time Calculation (min) 42 min   Activity Tolerance Patient tolerated treatment well   Behavior During Therapy [facility] for tasks assessed/performed      Past Medical History  Diagnosis Date  . Diverticulosis of colon (without mention of hemorrhage)   . Esophageal reflux   . Hiatal hernia   . Stricture and stenosis of esophagus   . Scleroderma   . Motility disorder, esophageal   . Gastroparesis   . CREST syndrome   . Raynauds phenomenon   . Seizure disorder   . Dementia   . Spondylitis, ankylosing   . Mixed connective tissue disease     Past Surgical History  Procedure Laterality Date  . Toe surgery      right great toe  . Thumb arthroscopy Left   . Wisdom tooth extraction    . Nose surgery  2005    There were no vitals taken for this visit.  Visit Diagnosis:  Poor balance  Bilateral leg weakness  Difficulty walking      Subjective Assessment - 01/05/15 1719    Symptoms Pt comes today wtih new script from Dr. Caprice Beaver (podiatrist) to limit WB actvitiy due to new ulceration on plantar surface of Lt foot.  Pt reports it is currently not hurting as much as was this morning (4/10.Marland Kitchennow 2/10).  Pt's wife is dressing ulcer at home.             [facility] Adult PT Treatment/Exercise - 01/05/15 1527    Lumbar Exercises: Stretches   Active Hamstring Stretch --   Active Hamstring Stretch Limitations --   Passive Hamstring  Stretch --   Passive Hamstring Stretch Limitations --   Lumbar Exercises: Seated   Sit to Stand 20 reps   Sit to Stand Limitations from low mat 18"   Knee/Hip Exercises: Stretches   Active Hamstring Stretch 3 reps;30 seconds   Active Hamstring Stretch Limitations both   Gastroc Stretch 3 reps;30 seconds   Gastroc Stretch Limitations both   Knee/Hip Exercises: Standing   SLS 25" each LE with longest hold of 1"   Balance Exercises: Standing   Retro Gait 2 reps;Limitations   Sidestepping 2 reps;Limitations   Sidestepping Limitations sumo walking   Step Over Hurdles / Cones obstacle course with hurdles, cones and balance beam 2RT   Balance Exercises   Tandem Walking 2 round trips                  PT Short Term Goals - 12/31/14 1201    PT SHORT TERM GOAL #1   Title I in HEP   Time 2   Period Weeks   Status On-going   PT SHORT TERM GOAL #2   Title Berg improved by 5 levels to decrease risk of falling   Baseline 42 on 12/31/14   Time 2   Period Weeks   Status  On-going           PT Long Term Goals - 12/31/14 1201    PT LONG TERM GOAL #1   Title I in advance HEP   Time 4   Period Weeks   Status On-going   PT LONG TERM GOAL #2   Title Pt to be walking 15 minutes a day at least three times a week for better health habitsl   Time 4   Period Weeks   Status On-going   PT LONG TERM GOAL #3   Title Berg score to be improved by 10 pts to allow pt to be safe to walk without the use of a cane.    Time 4   Period Weeks   Status On-going   PT LONG TERM GOAL #4   Title Pt mm strength improved by one level to improve power for sit to stand and stairclimbing activity.   Time 4   Status On-going               Plan - 01/05/15 1722    Clinical Impression Statement Continued with balance actvities, however held direct weight bearing onto ball of feet and prolonged ambulation actvities per MD order.  Explained to wife which actvities to avoid at home and also to  inform therapy if wound does not improve.   Pt able to complete actvities with most difficulty on balance beam.  Pt able to self correct when completes activity slower with increased planning.  Pt sometimes panics when unstable throwing balance off more.   Extreme tightness noted in hamstrings, Rt>Lt.   PT Next Visit Plan Continue to progress, however hold all painful exercises and extreme prolonged WB actvities due to ulcer on plantar surface of LT foot.          Problem List Patient Active Problem List   Diagnosis Date Noted  . Cervical pain 11/18/2012  . Ankylosing spondylitis 11/18/2012  . Esophageal motility disorder 01/22/2012  . GERD (gastroesophageal reflux disease) 01/22/2012  . GERD 03/08/2009  . DEMENTIA 03/07/2009  . RAYNAUD'S SYNDROME 03/07/2009  . ESOPHAGEAL STRICTURE 03/07/2009  . ESOPHAGEAL MOTILITY DISORDER 03/07/2009  . GASTROPARESIS 03/07/2009  . HIATAL HERNIA 03/07/2009  . DIVERTICULOSIS, COLON 03/07/2009  . SCLERODERMA 03/07/2009  . CREST SYNDROME 03/07/2009    Teena Irani, PTA/CLT [phone] 01/05/2015, 5:30 PM  [place], [place], [address] Phone: [phone]   Fax:  [phone]

## 2015-01-07 ENCOUNTER — Ambulatory Visit (HOSPITAL_COMMUNITY): Payer: PPO | Admitting: Physical Therapy

## 2015-01-07 DIAGNOSIS — R262 Difficulty in walking, not elsewhere classified: Secondary | ICD-10-CM

## 2015-01-07 DIAGNOSIS — R2689 Other abnormalities of gait and mobility: Secondary | ICD-10-CM

## 2015-01-07 DIAGNOSIS — R29898 Other symptoms and signs involving the musculoskeletal system: Secondary | ICD-10-CM

## 2015-01-07 NOTE — Therapy (Signed)
[place], [place], [address] Phone: [phone]   Fax:  [phone]  Physical Therapy Treatment  Patient Details  Name: Robert Carey MRN: [id number] Date of Birth: 1946-07-22 Referring Provider:  Hennie Duos, MD  Encounter Date: 01/07/2015      PT End of Session - 01/07/15 1604    Visit Number 10   Number of Visits 14   Date for PT Re-Evaluation 01/31/15   Authorization Type Medicare   Authorization - Visit Number 10   Authorization - Number of Visits 14   PT Start Time 1520   PT Stop Time 1600   PT Time Calculation (min) 40 min   Equipment Utilized During Treatment Gait belt   Activity Tolerance Patient tolerated treatment well      Past Medical History  Diagnosis Date  . Diverticulosis of colon (without mention of hemorrhage)   . Esophageal reflux   . Hiatal hernia   . Stricture and stenosis of esophagus   . Scleroderma   . Motility disorder, esophageal   . Gastroparesis   . CREST syndrome   . Raynauds phenomenon   . Seizure disorder   . Dementia   . Spondylitis, ankylosing   . Mixed connective tissue disease     Past Surgical History  Procedure Laterality Date  . Toe surgery      right great toe  . Thumb arthroscopy Left   . Wisdom tooth extraction    . Nose surgery  2005    There were no vitals taken for this visit.  Visit Diagnosis:  Poor balance  Bilateral leg weakness  Difficulty walking      Subjective Assessment - 01/07/15 1536    Symptoms Pt states that he probably has had the ulcer for years.  Not very paiinful (probable decreased sensation.   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   Both falls he has been tripping. He has been referred to physical therapy to try and improve his stabilty.   Pt belongs to the [facility] [employer] and  tries to swim once a week and goes to the silver sne[employer] once a week.    Currently in Pain? No/denies                    [facility] Adult PT Treatment/Exercise - 01/07/15 0001    Lumbar Exercises: Standing   Other Standing Lumbar Exercises walk on heels 1 RT; Rocker board A/P and Rt/LT x 2' each; narrow support foam eys closed;    Other Standing Lumbar Exercises foam: tandem eyes open Rt front then Lt front; normal stance head turns; tandem gt w/ head turns 2 RT    Lumbar Exercises: Seated   Other Seated Lumbar Exercises 3-D thoracic excursion c 5    Lumbar Exercises: Supine   Bridge 10 reps;Limitations   Bridge Limitations one leg only   Lumbar Exercises: Quadruped   Straight Leg Raise 10 reps   Opposite Arm/Leg Raise 5 reps   Opposite Arm/Leg Raise Limitations arm just comes off the mat                   PT Short Term Goals - 12/31/14 1201    PT SHORT TERM GOAL #1   Title I in HEP   Time 2   Period Weeks  Status On-going   PT SHORT TERM GOAL #2   Title Berg improved by 5 levels to decrease risk of falling   Baseline 42 on 12/31/14   Time 2   Period Weeks   Status On-going           PT Long Term Goals - 12/31/14 1201    PT LONG TERM GOAL #1   Title I in advance HEP   Time 4   Period Weeks   Status On-going   PT LONG TERM GOAL #2   Title Pt to be walking 15 minutes a day at least three times a week for better health habitsl   Time 4   Period Weeks   Status On-going   PT LONG TERM GOAL #3   Title Berg score to be improved by 10 pts to allow pt to be safe to walk without the use of a cane.    Time 4   Period Weeks   Status On-going   PT LONG TERM GOAL #4   Title Pt mm strength improved by one level to improve power for sit to stand and stairclimbing activity.   Time 4   Status On-going               Plan - Jan 23, 2015 1606    Clinical Impression Statement Pt has significant difficulty maintaining balance with quadriped activities as  well as foam activites but able to maintain balance.  Pt will continue to benefit from balance activities           G-Codes - 2015-01-23 1608    Functional Limitation Mobility: Walking and moving around   Mobility: Walking and Moving Around Current Status [phone]) At least 40 percent but less than 60 percent impaired, limited or restricted   Mobility: Walking and Moving Around Goal Status [phone]) At least 20 percent but less than 40 percent impaired, limited or restricted      Problem List Patient Active Problem List   Diagnosis Date Noted  . Cervical pain 11/18/2012  . Ankylosing spondylitis 11/18/2012  . Esophageal motility disorder 01/22/2012  . GERD (gastroesophageal reflux disease) 01/22/2012  . GERD 03/08/2009  . DEMENTIA 03/07/2009  . RAYNAUD'S SYNDROME 03/07/2009  . ESOPHAGEAL STRICTURE 03/07/2009  . ESOPHAGEAL MOTILITY DISORDER 03/07/2009  . GASTROPARESIS 03/07/2009  . HIATAL HERNIA 03/07/2009  . DIVERTICULOSIS, COLON 03/07/2009  . SCLERODERMA 03/07/2009  . CREST SYNDROME 03/07/2009    Robert Carey PT 01-23-15, 4:09 PM  [place] [address] [place], [place], [address] Phone: [phone]   Fax:  [phone]

## 2015-01-11 ENCOUNTER — Ambulatory Visit (HOSPITAL_COMMUNITY): Payer: PPO | Admitting: Physical Therapy

## 2015-01-11 DIAGNOSIS — R2689 Other abnormalities of gait and mobility: Secondary | ICD-10-CM

## 2015-01-11 DIAGNOSIS — R29898 Other symptoms and signs involving the musculoskeletal system: Secondary | ICD-10-CM

## 2015-01-11 DIAGNOSIS — R262 Difficulty in walking, not elsewhere classified: Secondary | ICD-10-CM

## 2015-01-11 NOTE — Therapy (Signed)
[place], [place], [address] Phone: [phone]   Fax:  [phone]  Physical Therapy Treatment  Patient Details  Name: Robert Carey MRN: [id number] Date of Birth: [birth year] Referring Provider:  Hennie Duos, MD  Encounter Date: 01/11/2015      PT End of Session - 01/11/15 1525    Visit Number 11   Number of Visits 14   Date for PT Re-Evaluation 01/31/15   Authorization Type Medicare   Authorization - Visit Number 11   Authorization - Number of Visits 14   PT Start Time 1430   PT Stop Time 1518   PT Time Calculation (min) 48 min   Equipment Utilized During Treatment Gait belt   Activity Tolerance Patient tolerated treatment well   Behavior During Therapy [facility] for tasks assessed/performed      Past Medical History  Diagnosis Date  . Diverticulosis of colon (without mention of hemorrhage)   . Esophageal reflux   . Hiatal hernia   . Stricture and stenosis of esophagus   . Scleroderma   . Motility disorder, esophageal   . Gastroparesis   . CREST syndrome   . Raynauds phenomenon   . Seizure disorder   . Dementia   . Spondylitis, ankylosing   . Mixed connective tissue disease     Past Surgical History  Procedure Laterality Date  . Toe surgery      right great toe  . Thumb arthroscopy Left   . Wisdom tooth extraction    . Nose surgery  2005    There were no vitals taken for this visit.  Visit Diagnosis:  Poor balance  Bilateral leg weakness  Difficulty walking                  [facility] Adult PT Treatment/Exercise - 01/11/15 1442    Lumbar Exercises: Standing   Other Standing Lumbar Exercises walk on heels 1 RT; Rocker board A/P and Rt/LT x 2' each; narrow support foam eys closed;    Other Standing Lumbar Exercises foam: tandem eyes open Rt front then Lt front; normal stance head turns; tandem gt w/ head turns 2 RT    Lumbar Exercises: Seated   Other Seated Lumbar  Exercises 3-D thoracic excursion 10 reps with UE reach   Lumbar Exercises: Supine   Bridge Limitations   Bridge Limitations 2 sets 10 reps one leg only   Lumbar Exercises: Quadruped   Straight Leg Raise 10 reps   Opposite Arm/Leg Raise 10 reps   Opposite Arm/Leg Raise Limitations arm just comes off the mat    Balance Exercises: Standing   Retro Gait 2 reps;Limitations   Sidestepping 2 reps;Limitations   Sidestepping Limitations sumo walking   Step Over Hurdles / Cones obstacle course with hurdles, cones and balance beam 2RT   Balance Exercises   Gait with Head Turns (Round Trips) tandem 1RT long hallway                  PT Short Term Goals - 12/31/14 1201    PT SHORT TERM GOAL #1   Title I in HEP   Time 2   Period Weeks   Status On-going   PT SHORT TERM GOAL #2   Title Berg improved by 5 levels to decrease risk of falling   Baseline 42 on 12/31/14   Time 2   Period Weeks   Status On-going  PT Long Term Goals - 12/31/14 1201    PT LONG TERM GOAL #1   Title I in advance HEP   Time 4   Period Weeks   Status On-going   PT LONG TERM GOAL #2   Title Pt to be walking 15 minutes a day at least three times a week for better health habitsl   Time 4   Period Weeks   Status On-going   PT LONG TERM GOAL #3   Title Berg score to be improved by 10 pts to allow pt to be safe to walk without the use of a cane.    Time 4   Period Weeks   Status On-going   PT LONG TERM GOAL #4   Title Pt mm strength improved by one level to improve power for sit to stand and stairclimbing activity.   Time 4   Status On-going               Plan - 01/11/15 1525    Clinical Impression Statement Pt with decreased difficulty maintaining balance today with overall improvment completing balance beam/foam and tandem actvitieis.  Pt also with increased ROM of Rt UE with actvitieis.  Pt without uncorrected LOB today.  No complaints of pain in plantar aspect of foot today with  therapy.    PT Next Visit Plan Continue to progress, however hold all painful exercises and extreme prolonged WB actvities due to ulcer on plantar surface of LT foot.          Problem List Patient Active Problem List   Diagnosis Date Noted  . Cervical pain 11/18/2012  . Ankylosing spondylitis 11/18/2012  . Esophageal motility disorder 01/22/2012  . GERD (gastroesophageal reflux disease) 01/22/2012  . GERD 03/08/2009  . DEMENTIA 03/07/2009  . RAYNAUD'S SYNDROME 03/07/2009  . ESOPHAGEAL STRICTURE 03/07/2009  . ESOPHAGEAL MOTILITY DISORDER 03/07/2009  . GASTROPARESIS 03/07/2009  . HIATAL HERNIA 03/07/2009  . DIVERTICULOSIS, COLON 03/07/2009  . SCLERODERMA 03/07/2009  . CREST SYNDROME 03/07/2009    Teena Irani, PTA/CLT [phone] 01/11/2015, 3:29 PM  [place] [address] [place], [place], [address] Phone: [phone]   Fax:  [phone]

## 2015-01-11 NOTE — Addendum Note (Signed)
Encounter addended by: Leeroy Cha, PT on: 01/11/2015  9:06 AM<BR>     Documentation filed: [facility] Follow-up Actions, Letters

## 2015-01-17 ENCOUNTER — Ambulatory Visit (HOSPITAL_COMMUNITY): Payer: PPO | Admitting: Physical Therapy

## 2015-01-17 DIAGNOSIS — R2689 Other abnormalities of gait and mobility: Secondary | ICD-10-CM | POA: Diagnosis not present

## 2015-01-17 DIAGNOSIS — R29898 Other symptoms and signs involving the musculoskeletal system: Secondary | ICD-10-CM

## 2015-01-17 DIAGNOSIS — R262 Difficulty in walking, not elsewhere classified: Secondary | ICD-10-CM

## 2015-01-17 NOTE — Therapy (Signed)
[place], [place], [address] Phone: [phone]   Fax:  [phone]  Physical Therapy Treatment  Patient Details  Name: Robert Carey MRN: [id number] Date of Birth: [birth year] Referring Provider:  Hennie Duos, MD  Encounter Date: 01/17/2015      PT End of Session - 01/17/15 1226    Visit Number 12   Number of Visits 14   Date for PT Re-Evaluation 01/31/15   Authorization Type Medicare   Authorization - Visit Number 12   Authorization - Number of Visits 14   PT Start Time 0933   PT Stop Time 1016   PT Time Calculation (min) 43 min   Equipment Utilized During Treatment Gait belt   Activity Tolerance Patient tolerated treatment well   Behavior During Therapy [id number] for tasks assessed/performed      Past Medical History  Diagnosis Date  . Diverticulosis of colon (without mention of hemorrhage)   . Esophageal reflux   . Hiatal hernia   . Stricture and stenosis of esophagus   . Scleroderma   . Motility disorder, esophageal   . Gastroparesis   . CREST syndrome   . Raynauds phenomenon   . Seizure disorder   . Dementia   . Spondylitis, ankylosing   . Mixed connective tissue disease     Past Surgical History  Procedure Laterality Date  . Toe surgery      right great toe  . Thumb arthroscopy Left   . Wisdom tooth extraction    . Nose surgery  2005    There were no vitals taken for this visit.  Visit Diagnosis:  Poor balance  Bilateral leg weakness  Difficulty walking      Subjective Assessment - 01/17/15 1225    Symptoms Pt states his ulcer is healing on the plantar aspect of his foot.  States he feels his balance is improving. Currently without pain.   Currently in Pain? No/denies                    [facility] Adult PT Treatment/Exercise - 01/17/15 0934    Lumbar Exercises: Stretches   Active Hamstring Stretch 2 reps;30 seconds   Active Hamstring Stretch Limitations onto  14" box   Lumbar Exercises: Standing   Heel Raises 10 reps   Heel Raises Limitations squat with red ball and heelraise   Functional Squats 10 reps   Functional Squats Limitations split stance   Other Standing Lumbar Exercises walk on heels 2 RT; Rocker board A/P and Rt/LT x 2' each; narrow support foam eys closed;    Other Standing Lumbar Exercises foam: tandem eyes open Rt front then Lt front 30" each; normal stance head turns   Lumbar Exercises: Seated   Sit to Stand 20 reps   Sit to Stand Limitations from low mat 18"   Other Seated Lumbar Exercises 3-D thoracic excursion 10 reps with UE reach   Lumbar Exercises: Quadruped   Straight Leg Raise 10 reps   Opposite Arm/Leg Raise 10 reps   Opposite Arm/Leg Raise Limitations arm just comes off the mat    Knee/Hip Exercises: Stretches   Piriformis Stretch 1 rep;30 seconds   Piriformis Stretch Limitations manual assist with Rt LE   Balance Exercises: Standing   Sidestepping 2 reps;Limitations   Sidestepping Limitations sumo walking with GTB   Balance Exercises   Gait with Head Turns (Round Trips) tandem 2RT  PT Short Term Goals - 12/31/14 1201    PT SHORT TERM GOAL #1   Title I in HEP   Time 2   Period Weeks   Status On-going   PT SHORT TERM GOAL #2   Title Berg improved by 5 levels to decrease risk of falling   Baseline 42 on 12/31/14   Time 2   Period Weeks   Status On-going           PT Long Term Goals - 12/31/14 1201    PT LONG TERM GOAL #1   Title I in advance HEP   Time 4   Period Weeks   Status On-going   PT LONG TERM GOAL #2   Title Pt to be walking 15 minutes a day at least three times a week for better health habitsl   Time 4   Period Weeks   Status On-going   PT LONG TERM GOAL #3   Title Berg score to be improved by 10 pts to allow pt to be safe to walk without the use of a cane.    Time 4   Period Weeks   Status On-going   PT LONG TERM GOAL #4   Title Pt mm strength  improved by one level to improve power for sit to stand and stairclimbing activity.   Time 4   Status On-going               Plan - 01/17/15 1228    Clinical Impression Statement Pt with improving balance.  Attempted head turns with tandem gait, however patient too unstable to complete.  Mod LOB wtihout head turns.  Completed squat with split stance with patient requiring manual assist of therapist to keep Rt knee and toe in neutral with Rt LE back.  Also with extreme tightness in Rt hamstring and piriformis.  Added squat/heelraise actvity using red ball without diffiiculty.     PT Next Visit Plan Continue to progress, however hold all painful exercises and extreme prolonged WB actvities due to ulcer on plantar surface of LT foot.          Problem List Patient Active Problem List   Diagnosis Date Noted  . Cervical pain 11/18/2012  . Ankylosing spondylitis 11/18/2012  . Esophageal motility disorder 01/22/2012  . GERD (gastroesophageal reflux disease) 01/22/2012  . GERD 03/08/2009  . DEMENTIA 03/07/2009  . RAYNAUD'S SYNDROME 03/07/2009  . ESOPHAGEAL STRICTURE 03/07/2009  . ESOPHAGEAL MOTILITY DISORDER 03/07/2009  . GASTROPARESIS 03/07/2009  . HIATAL HERNIA 03/07/2009  . DIVERTICULOSIS, COLON 03/07/2009  . SCLERODERMA 03/07/2009  . CREST SYNDROME 03/07/2009    Teena Irani, PTA/CLT [phone] 01/17/2015, 12:31 PM  [place] [address] [place], [place], [address] Phone: [phone]   Fax:  [phone]

## 2015-01-18 ENCOUNTER — Telehealth: Payer: Self-pay | Admitting: Internal Medicine

## 2015-01-19 NOTE — Telephone Encounter (Signed)
Spoke with insurance company who explained that although the plan covered a short term supply of Aciphex, it now needed a prior authorization for the rest of the year.  Awaiting the faxed form to fill out and initiating the process.  Spoke with patient and told him I would call him with an update as soon as I faxed the form.  Patient agreed.

## 2015-01-19 NOTE — Telephone Encounter (Signed)
Completed prior authorization form for Aciphex and faxed it back.  Awaiting response.

## 2015-01-19 NOTE — Telephone Encounter (Signed)
Wife calling about prior Josem Kaufmann, wants to make sure its being done.  Please call back at same number.

## 2015-01-20 ENCOUNTER — Ambulatory Visit (HOSPITAL_COMMUNITY): Payer: PPO | Attending: Internal Medicine | Admitting: Physical Therapy

## 2015-01-20 DIAGNOSIS — R2689 Other abnormalities of gait and mobility: Secondary | ICD-10-CM | POA: Insufficient documentation

## 2015-01-20 DIAGNOSIS — R262 Difficulty in walking, not elsewhere classified: Secondary | ICD-10-CM | POA: Diagnosis not present

## 2015-01-20 DIAGNOSIS — R29898 Other symptoms and signs involving the musculoskeletal system: Secondary | ICD-10-CM | POA: Insufficient documentation

## 2015-01-20 NOTE — Therapy (Signed)
[place], [place], [address] Phone: [phone]   Fax:  [phone]  Physical Therapy Treatment  Patient Details  Name: Robert Carey MRN: [id number] Date of Birth: 04/04/1946 Referring Provider:  Hennie Duos, MD  Encounter Date: 01/20/2015      PT End of Session - 01/20/15 1042    Visit Number 13   Number of Visits 16   Date for PT Re-Evaluation 01/31/15   Authorization Type Medicare   Authorization - Visit Number 13   Authorization - Number of Visits 20   PT Start Time 0930   PT Stop Time 1015   PT Time Calculation (min) 45 min   Equipment Utilized During Treatment Gait belt   Activity Tolerance Patient tolerated treatment well   Behavior During Therapy [facility] for tasks assessed/performed      Past Medical History  Diagnosis Date  . Diverticulosis of colon (without mention of hemorrhage)   . Esophageal reflux   . Hiatal hernia   . Stricture and stenosis of esophagus   . Scleroderma   . Motility disorder, esophageal   . Gastroparesis   . CREST syndrome   . Raynauds phenomenon   . Seizure disorder   . Dementia   . Spondylitis, ankylosing   . Mixed connective tissue disease     Past Surgical History  Procedure Laterality Date  . Toe surgery      right great toe  . Thumb arthroscopy Left   . Wisdom tooth extraction    . Nose surgery  2005    There were no vitals taken for this visit.  Visit Diagnosis:  Poor balance  Bilateral leg weakness  Difficulty walking      Subjective Assessment - 01/20/15 0935    Symptoms PT states he did not sleep well last night and unsure why; states he is currently without pain or problems.     Currently in Pain? No/denies                    [facility] Adult PT Treatment/Exercise - 01/20/15 0938    Lumbar Exercises: Stretches   Active Hamstring Stretch 2 reps;30 seconds   Active Hamstring Stretch Limitations onto 14" box   Lumbar  Exercises: Standing   Heel Raises 10 reps   Heel Raises Limitations squat with yellow ball and heelraise   Functional Squats 10 reps   Functional Squats Limitations split stance   Other Standing Lumbar Exercises walk on heels 2 RT; Rocker board A/P and Rt/LT x 2' each;   Other Standing Lumbar Exercises foam: tandem eyes open Rt front then Lt front 30" each; normal stance head turns with eyes closed, marching without HHA 10 reps   Lumbar Exercises: Quadruped   Opposite Arm/Leg Raise 10 reps   Opposite Arm/Leg Raise Limitations arm just comes off the mat    Knee/Hip Exercises: Stretches   Piriformis Stretch 1 rep;30 seconds   Piriformis Stretch Limitations manual assist with Rt LE   Balance Exercises: Standing   Retro Gait 2 reps;Limitations   Retro Gait Limitations tandem retro   Sidestepping 2 reps;Limitations   Sidestepping Limitations sumo walking with GTB   Step Over Hurdles / Cones obstacle course with hurdles, cones and balance beam 2RT   Balance Exercises   Tandem Walking 2 round trips                  PT Short Term Goals - 12/31/14 1201  PT SHORT TERM GOAL #1   Title I in HEP   Time 2   Period Weeks   Status On-going   PT SHORT TERM GOAL #2   Title Berg improved by 5 levels to decrease risk of falling   Baseline 42 on 12/31/14   Time 2   Period Weeks   Status On-going           PT Long Term Goals - 12/31/14 1201    PT LONG TERM GOAL #1   Title I in advance HEP   Time 4   Period Weeks   Status On-going   PT LONG TERM GOAL #2   Title Pt to be walking 15 minutes a day at least three times a week for better health habitsl   Time 4   Period Weeks   Status On-going   PT LONG TERM GOAL #3   Title Berg score to be improved by 10 pts to allow pt to be safe to walk without the use of a cane.    Time 4   Period Weeks   Status On-going   PT LONG TERM GOAL #4   Title Pt mm strength improved by one level to improve power for sit to stand and  stairclimbing activity.   Time 4   Status On-going               Plan - 01/20/15 1038    Clinical Impression Statement Continued improvments in stabiltiy noted today with increased ease clearing hurdles (reciprocally) and maintaining balance on foam beam. Pt able to self correct slight LOB as he was unable to do this initially without therapist assistance. Pt without clo pain with any exercises and good control/depth with squats achieved. continued need for manual assist to keep knees neurtral with split stance squats   PT Next Visit Plan Continue to progress, however hold all painful exercises and extreme prolonged WB actvities due to ulcer on plantar surface of LT foot.  Re-evaluate next 2 visits.        Problem List Patient Active Problem List   Diagnosis Date Noted  . Cervical pain 11/18/2012  . Ankylosing spondylitis 11/18/2012  . Esophageal motility disorder 01/22/2012  . GERD (gastroesophageal reflux disease) 01/22/2012  . GERD 03/08/2009  . DEMENTIA 03/07/2009  . RAYNAUD'S SYNDROME 03/07/2009  . ESOPHAGEAL STRICTURE 03/07/2009  . ESOPHAGEAL MOTILITY DISORDER 03/07/2009  . GASTROPARESIS 03/07/2009  . HIATAL HERNIA 03/07/2009  . DIVERTICULOSIS, COLON 03/07/2009  . SCLERODERMA 03/07/2009  . CREST SYNDROME 03/07/2009    Teena Irani, PTA/CLT [phone] 01/20/2015, 10:44 AM  [place], [place], [address] Phone: [phone]   Fax:  [phone]

## 2015-01-26 ENCOUNTER — Ambulatory Visit (HOSPITAL_COMMUNITY): Payer: PPO | Admitting: Physical Therapy

## 2015-01-26 DIAGNOSIS — R2689 Other abnormalities of gait and mobility: Secondary | ICD-10-CM | POA: Diagnosis not present

## 2015-01-26 NOTE — Therapy (Signed)
[place] [address] [place], [place], [address] Phone: [phone]   Fax:  [phone]  Physical Therapy Treatment  Patient Details  Name: Robert Carey MRN: [id number] Date of Birth: 01/09/1946 Referring Provider:  Leigh Aurora, MD  Encounter Date: 01/26/2015      PT End of Session - 01/26/15 1353    Visit Number 14   Number of Visits 16   Date for PT Re-Evaluation 01/31/15   Authorization Type Medicare   Authorization - Visit Number 14   Authorization - Number of Visits 20   PT Start Time 1300   PT Stop Time 1345   PT Time Calculation (min) 45 min   Equipment Utilized During Treatment Gait belt   Activity Tolerance Patient tolerated treatment well   Behavior During Therapy [facility] for tasks assessed/performed      Past Medical History  Diagnosis Date  . Diverticulosis of colon (without mention of hemorrhage)   . Esophageal reflux   . Hiatal hernia   . Stricture and stenosis of esophagus   . Scleroderma   . Motility disorder, esophageal   . Gastroparesis   . CREST syndrome   . Raynauds phenomenon   . Seizure disorder   . Dementia   . Spondylitis, ankylosing   . Mixed connective tissue disease     Past Surgical History  Procedure Laterality Date  . Toe surgery      right great toe  . Thumb arthroscopy Left   . Wisdom tooth extraction    . Nose surgery  2005    There were no vitals taken for this visit.  Visit Diagnosis:  No diagnosis found.        [facility] PT Assessment - 01/26/15 1346    Berg Balance Test   Sit to Stand Able to stand without using hands and stabilize independently   Standing Unsupported Able to stand safely 2 minutes   Sitting with Back Unsupported but Feet Supported on Floor or Stool Able to sit safely and securely 2 minutes   Stand to Sit Sits safely with minimal use of hands   Transfers Able to transfer safely, minor use of hands   Standing Unsupported with Eyes Closed Able to  stand 10 seconds with supervision   Standing Ubsupported with Feet Together Able to place feet together independently and stand 1 minute safely   From Standing, Reach Forward with Outstretched Arm Can reach forward >12 cm safely (5")   From Standing Position, Pick up Object from Floor Able to pick up shoe safely and easily   From Standing Position, Turn to Look Behind Over each Shoulder Turn sideways only but maintains balance   Turn 360 Degrees Able to turn 360 degrees safely but slowly   Standing Unsupported, Alternately Place Feet on Step/Stool Able to stand independently and safely and complete 8 steps in 20 seconds   Standing Unsupported, One Foot in Front Able to plae foot ahead of the other independently and hold 30 seconds   Standing on One Leg Able to lift leg independently and hold 5-10 seconds   Total Score 48                  [facility] Adult PT Treatment/Exercise - 01/26/15 0001    Lumbar Exercises: Stretches   Active Hamstring Stretch 2 reps;30 seconds   Active Hamstring Stretch Limitations onto 14" box   Lumbar Exercises: Standing   Heel Raises 15 reps   Heel Raises Limitations squat  with yellow ball and heelraise   Functional Squats 15 reps   Functional Squats Limitations split stance   Other Standing Lumbar Exercises walk on heels 2 RT; Rocker board A/P and Rt/LT x 2' each no UE assist;   Other Standing Lumbar Exercises foam: tandem walk fwd/bkwd;marching without HHA 10 reps                  PT Short Term Goals - 12/31/14 1201    PT SHORT TERM GOAL #1   Title I in HEP   Time 2   Period Weeks   Status On-going   PT SHORT TERM GOAL #2   Title Berg improved by 5 levels to decrease risk of falling   Baseline 42 on 12/31/14   Time 2   Period Weeks   Status On-going           PT Long Term Goals - 12/31/14 1201    PT LONG TERM GOAL #1   Title I in advance HEP   Time 4   Period Weeks   Status On-going   PT LONG TERM GOAL #2   Title Pt to be  walking 15 minutes a day at least three times a week for better health habitsl   Time 4   Period Weeks   Status On-going   PT LONG TERM GOAL #3   Title Berg score to be improved by 10 pts to allow pt to be safe to walk without the use of a cane.    Time 4   Period Weeks   Status On-going   PT LONG TERM GOAL #4   Title Pt mm strength improved by one level to improve power for sit to stand and stairclimbing activity.   Time 4   Status On-going               Plan - 01/26/15 1350    Clinical Impression Statement Pt continues to improve with balance actvities.  Completed BERG test today wtih 8 point improvement as compared to beginning therapy (48 now was 40).  Pt continues to have most difficulty completing tandem gait tasks.  Able to negotiate stairs reciprocally without any difficulties, however repeated lateral step ups are difficult for patient and requires manual assist from therapist to keep knee from internally rotating.    PT Next Visit Plan Continue to progress, however hold all painful exercises and extreme prolonged WB actvities due to ulcer on plantar surface of LT foot.  Re-evaluate next visit.        Problem List Patient Active Problem List   Diagnosis Date Noted  . Cervical pain 11/18/2012  . Ankylosing spondylitis 11/18/2012  . Esophageal motility disorder 01/22/2012  . GERD (gastroesophageal reflux disease) 01/22/2012  . GERD 03/08/2009  . DEMENTIA 03/07/2009  . RAYNAUD'S SYNDROME 03/07/2009  . ESOPHAGEAL STRICTURE 03/07/2009  . ESOPHAGEAL MOTILITY DISORDER 03/07/2009  . GASTROPARESIS 03/07/2009  . HIATAL HERNIA 03/07/2009  . DIVERTICULOSIS, COLON 03/07/2009  . SCLERODERMA 03/07/2009  . CREST SYNDROME 03/07/2009    Teena Irani, PTA/CLT [phone] 01/26/2015, 1:54 PM  [place] [address] [place], [place], [address] Phone: [phone]   Fax:  [phone]

## 2015-01-27 ENCOUNTER — Ambulatory Visit (HOSPITAL_COMMUNITY): Payer: PPO | Admitting: Physical Therapy

## 2015-01-27 DIAGNOSIS — R2689 Other abnormalities of gait and mobility: Secondary | ICD-10-CM

## 2015-01-27 DIAGNOSIS — R262 Difficulty in walking, not elsewhere classified: Secondary | ICD-10-CM

## 2015-01-27 DIAGNOSIS — R29898 Other symptoms and signs involving the musculoskeletal system: Secondary | ICD-10-CM

## 2015-01-27 NOTE — Therapy (Signed)
[place], [place], [address] Phone: [phone]   Fax:  [phone]  Physical Therapy Treatment/Reassessment  Patient Details  Name: Robert Carey MRN: [id number] Date of Birth: [birth year] Referring Provider:  Asencion Noble, MD  Encounter Date: 01/27/2015      PT End of Session - 01/27/15 1714    Visit Number 15   Number of Visits 16   Date for PT Re-Evaluation 01/31/15   Authorization Type Medicare   Authorization - Visit Number 15   Authorization - Number of Visits 20   PT Start Time 0938   PT Stop Time 1724   PT Time Calculation (min) 34 min      Past Medical History  Diagnosis Date  . Diverticulosis of colon (without mention of hemorrhage)   . Esophageal reflux   . Hiatal hernia   . Stricture and stenosis of esophagus   . Scleroderma   . Motility disorder, esophageal   . Gastroparesis   . CREST syndrome   . Raynauds phenomenon   . Seizure disorder   . Dementia   . Spondylitis, ankylosing   . Mixed connective tissue disease     Past Surgical History  Procedure Laterality Date  . Toe surgery      right great toe  . Thumb arthroscopy Left   . Wisdom tooth extraction    . Nose surgery  2005    There were no vitals filed for this visit.  Visit Diagnosis:  Poor balance  Bilateral leg weakness  Difficulty walking      Subjective Assessment - 01/27/15 1623    Symptoms PT states he did not sleep well last night and unsure why; states he is currently without pain or problems.   notes continued difficulty with walking straight line in tandem.             [facility] PT Assessment - 01/27/15 0001    Assessment   Medical Diagnosis falls   Onset Date 09/19/14   Next MD Visit 02/18/2015   Prior Therapy none   Balance Screen   Has the patient fallen in the past 6 months --  no falls since therapy.    Strength   Right Hip Flexion 5/5   Right Hip Extension 4/5   Right Hip ABduction 4/5    Left Hip Flexion 5/5   Left Hip Extension 4/5   Left Hip ABduction 4/5   Right Knee Flexion 5/5   Right Knee Extension 5/5   Left Knee Flexion 5/5   Left Knee Extension 5/5   Right Ankle Dorsiflexion 5/5   Left Ankle Dorsiflexion 5/5   Berg Balance Test   Sit to Stand Able to stand without using hands and stabilize independently   Standing Unsupported Able to stand safely 2 minutes   Sitting with Back Unsupported but Feet Supported on Floor or Stool Able to sit safely and securely 2 minutes   Stand to Sit Sits safely with minimal use of hands   Transfers Able to transfer safely, minor use of hands   Standing Unsupported with Eyes Closed Able to stand 10 seconds safely   Standing Ubsupported with Feet Together Able to place feet together independently and stand 1 minute safely   From Standing, Reach Forward with Outstretched Arm Can reach confidently >25 cm (10")   From Standing Position, Pick up Object from Floor Able to pick up shoe safely and easily   From Standing Position, Turn to Look  Behind Over each Shoulder Turn sideways only but maintains balance   Turn 360 Degrees Able to turn 360 degrees safely but slowly   Standing Unsupported, Alternately Place Feet on Step/Stool Able to stand independently and safely and complete 8 steps in 20 seconds   Standing Unsupported, One Foot in Front Able to plae foot ahead of the other independently and hold 30 seconds   Standing on One Leg Able to lift leg independently and hold 5-10 seconds   Total Score 50           [facility] Adult PT Treatment/Exercise - 01/27/15 0001    Ambulation/Gait   Gait Comments 6 minute walk 1.3 m/s = better than community ambulator status, 428mters            PT Short Term Goals - 01/27/15 1718    PT SHORT TERM GOAL #1   Title I in HEP   Status Achieved   PT SHORT TERM GOAL #2   Title Berg improved by 5 levels to decrease risk of falling   Status Achieved           PT Long Term Goals - 01/27/15  1716    PT LONG TERM GOAL #1   Title I in advance HEP   Status Achieved   PT LONG TERM GOAL #2   Title Pt to be walking 15 minutes a day at least three times a week for better health habitsl   Status Achieved   PT LONG TERM GOAL #3   Title Berg score to be improved by 10 pts to allow pt to be safe to walk without the use of a cane.    Status Achieved   PT LONG TERM GOAL #4   Title Pt mm strength improved by one level to improve power for sit to stand and stairclimbing activity.   Status Achieved   PT LONG TERM GOAL #5   Title Patient will be able to walk on unstable surfaces and in crowds without loss of balance.    Time 2   Period Weeks   Status New            Plan - 01/27/15 1718    Clinical Impression Statement Patient has met all but 1 long term goal with improved balance though still stlightly limited secondary to difficulty performing cross over and teandem walking. patient displays much improved balance and decreased risk of falls achieving community ambulator status. Patient still at risk of falls when ambulating in crowds.    PT Frequency 2x / week   PT Duration 2 weeks   PT Next Visit Plan progress dynamic balance activities and agility activities. Introduce agility ladder to improve stepping reactions and tandame and cross-over gait activities,         Problem List Patient Active Problem List   Diagnosis Date Noted  . Cervical pain 11/18/2012  . Ankylosing spondylitis 11/18/2012  . Esophageal motility disorder 01/22/2012  . GERD (gastroesophageal reflux disease) 01/22/2012  . GERD 03/08/2009  . DEMENTIA 03/07/2009  . RAYNAUD'S SYNDROME 03/07/2009  . ESOPHAGEAL STRICTURE 03/07/2009  . ESOPHAGEAL MOTILITY DISORDER 03/07/2009  . GASTROPARESIS 03/07/2009  . HIATAL HERNIA 03/07/2009  . DIVERTICULOSIS, COLON 03/07/2009  . SCLERODERMA 03/07/2009  . CREST SYNDROME 03/07/2009   CDevona KonigPT DPT [id number] [address]Phone: [phone]  Fax:  [phone]

## 2015-01-28 NOTE — Telephone Encounter (Signed)
Received fax from pharmacy requesting diagnosis code on form, completed request, faxed back

## 2015-02-03 NOTE — Telephone Encounter (Signed)
Spoke with [employer] who told me patient's Aciphex had been approved.   Called and spoke to patient's wife to make sure they had been contacted.  They had been told by the insurance company Aciphex had been approved.

## 2015-02-09 ENCOUNTER — Ambulatory Visit (HOSPITAL_COMMUNITY): Payer: PPO | Admitting: Physical Therapy

## 2015-02-09 DIAGNOSIS — R2689 Other abnormalities of gait and mobility: Secondary | ICD-10-CM

## 2015-02-09 DIAGNOSIS — R29898 Other symptoms and signs involving the musculoskeletal system: Secondary | ICD-10-CM

## 2015-02-09 DIAGNOSIS — R262 Difficulty in walking, not elsewhere classified: Secondary | ICD-10-CM

## 2015-02-09 NOTE — Therapy (Signed)
[facility] [address] [place], [place], [address] Phone: [phone]   Fax:  [phone]  Physical Therapy Treatment  Patient Details  Name: Robert Carey MRN: [id number] Date of Birth: 1946/05/26 Referring Provider:  Leigh Aurora, MD  Encounter Date: 02/09/2015      PT End of Session - 02/09/15 1601    Visit Number 16   Number of Visits 19   Date for PT Re-Evaluation 02/22/15   Authorization Type Medicare   Authorization - Visit Number 16   Authorization - Number of Visits 19   PT Start Time 1520   PT Stop Time 1558   PT Time Calculation (min) 38 min   Equipment Utilized During Treatment Gait belt   Activity Tolerance Patient tolerated treatment well   Behavior During Therapy WFL for tasks assessed/performed      Past Medical History  Diagnosis Date  . Diverticulosis of colon (without mention of hemorrhage)   . Esophageal reflux   . Hiatal hernia   . Stricture and stenosis of esophagus   . Scleroderma   . Motility disorder, esophageal   . Gastroparesis   . CREST syndrome   . Raynauds phenomenon   . Seizure disorder   . Dementia   . Spondylitis, ankylosing   . Mixed connective tissue disease     Past Surgical History  Procedure Laterality Date  . Toe surgery      right great toe  . Thumb arthroscopy Left   . Wisdom tooth extraction    . Nose surgery  2005    There were no vitals filed for this visit.  Visit Diagnosis:  Poor balance  Bilateral leg weakness  Difficulty walking      Subjective Assessment - 02/09/15 1519    Symptoms PT states he feels good    Patient Stated Goals Pt wants to help his balance    Currently in Pain? No/denies             Balance Exercises - 02/09/15 1531    Balance Exercises: Standing   Standing Eyes Closed Narrow base of support (BOS);Head turns;1 rep;30 secs   Tandem Stance Eyes open;Eyes closed;1 rep;30 secs  eyes open x 30seconds then close eyes    SLS with Vectors Solid surface;3 reps;10 secs   Stepping Strategy 10 reps;Other reps (comment)  t band    Tandem Gait Forward;Retro;Foam/compliant surface;2 reps;Limitations   Sidestepping Foam/compliant support;2 reps   Marching Limitations 10 times   Other Standing Exercises 3 D hip excursion; agility ladder x 2 reps for in/ outs    Balance Exercises: Seated   Other Seated Exercises sit to stand x 10 with Rt foot back/ 10 with Lt back              PT Short Term Goals - 01/27/15 1718    PT SHORT TERM GOAL #1   Title I in HEP   Status Achieved   PT SHORT TERM GOAL #2   Title Berg improved by 5 levels to decrease risk of falling   Status Achieved           PT Long Term Goals - 01/27/15 1716    PT LONG TERM GOAL #1   Title I in advance HEP   Status Achieved   PT LONG TERM GOAL #2   Title Pt to be walking 15 minutes a day at least three times a week for better health habitsl   Status Achieved   PT  LONG TERM GOAL #3   Title Berg score to be improved by 10 pts to allow pt to be safe to walk without the use of a cane.    Status Achieved   PT LONG TERM GOAL #4   Title Pt mm strength improved by one level to improve power for sit to stand and stairclimbing activity.   Status Achieved   PT LONG TERM GOAL #5   Title Patient will be able to walk on unstable surfaces and in crowds without loss of balance.    Time 2   Period Weeks   Status New               Plan - 02/09/15 1602    Clinical Impression Statement Pt demonstrates overall improved balance but continues to lose balance with shoulders going behind center of gravity.  Pt displays improved ability to self correct when he gets out of balance    PT Next Visit Plan begin cross over gait activities next treatment        Problem List Patient Active Problem List   Diagnosis Date Noted  . Cervical pain 11/18/2012  . Ankylosing spondylitis 11/18/2012  . Esophageal motility disorder 01/22/2012  . GERD  (gastroesophageal reflux disease) 01/22/2012  . GERD 03/08/2009  . DEMENTIA 03/07/2009  . RAYNAUD'S SYNDROME 03/07/2009  . ESOPHAGEAL STRICTURE 03/07/2009  . ESOPHAGEAL MOTILITY DISORDER 03/07/2009  . GASTROPARESIS 03/07/2009  . HIATAL HERNIA 03/07/2009  . DIVERTICULOSIS, COLON 03/07/2009  . SCLERODERMA 03/07/2009  . CREST SYNDROME 03/07/2009   Rayetta Humphrey PT [phone]  02/09/2015, 4:06 PM  [facility] [address] [place], [place], [address] Phone: [phone]   Fax:  [phone]

## 2015-02-11 ENCOUNTER — Ambulatory Visit (HOSPITAL_COMMUNITY): Payer: PPO | Admitting: Physical Therapy

## 2015-02-11 DIAGNOSIS — R29898 Other symptoms and signs involving the musculoskeletal system: Secondary | ICD-10-CM

## 2015-02-11 DIAGNOSIS — R2689 Other abnormalities of gait and mobility: Secondary | ICD-10-CM | POA: Diagnosis not present

## 2015-02-11 DIAGNOSIS — R262 Difficulty in walking, not elsewhere classified: Secondary | ICD-10-CM

## 2015-02-11 NOTE — Therapy (Signed)
[facility] [place], [place], [address] Phone: [phone]   Fax:  [phone]  Physical Therapy Treatment  Patient Details  Name: MELQUISEDEC Carey MRN: [id number] Date of Birth: Sep 29, 1946 Referring Provider:  Leigh Aurora, MD  Encounter Date: 02/11/2015      PT End of Session - 02/11/15 1612    Visit Number 17   Number of Visits 19   Date for PT Re-Evaluation 02/22/15   Authorization Type Medicare   Authorization - Visit Number 88   Authorization - Number of Visits 19   PT Start Time 1525   PT Stop Time 1603   PT Time Calculation (min) 38 min   Equipment Utilized During Treatment Gait belt   Activity Tolerance Patient tolerated treatment well   Behavior During Therapy WFL for tasks assessed/performed      Past Medical History  Diagnosis Date  . Diverticulosis of colon (without mention of hemorrhage)   . Esophageal reflux   . Hiatal hernia   . Stricture and stenosis of esophagus   . Scleroderma   . Motility disorder, esophageal   . Gastroparesis   . CREST syndrome   . Raynauds phenomenon   . Seizure disorder   . Dementia   . Spondylitis, ankylosing   . Mixed connective tissue disease     Past Surgical History  Procedure Laterality Date  . Toe surgery      right great toe  . Thumb arthroscopy Left   . Wisdom tooth extraction    . Nose surgery  2005    There were no vitals filed for this visit.  Visit Diagnosis:  Poor balance  Bilateral leg weakness  Difficulty walking      Subjective Assessment - 02/11/15 1606    Symptoms Pt states he is doing well feels that he is balancing better   Currently in Pain? No/denies             Balance Exercises - 02/11/15 1607    Balance Exercises: Standing   Standing Eyes Closed Narrow base of support (BOS);Head turns;1 rep;30 secs   Tandem Stance Eyes open;Eyes closed;1 rep;30 secs  eyes closed counting down from 50 by 3's    Wall Bumps  Shoulder   Wall Bumps-Shoulders Eyes opened;10 reps   Stepping Strategy --  side step with t-band x 2 RT   Tandem Gait Forward;Retro;Foam/compliant surface;2 reps;Limitations   Step Over Hurdles / Cones --  4' hurdles on balance beam x 2 RT   Marching Limitations 10 times   Heel Raises Limitations --  heel walk x 2 RT   Toe Raise Limitations --  Toe walk x 2 RT   Other Standing Exercises braiding x 2 RT  Ladder in out x 2 RT    Balance Exercises: Seated   Other Seated Exercises sit to stand x 10 with Rt foot back/ 10 with Lt back              PT Short Term Goals - 01/27/15 1718    PT SHORT TERM GOAL #1   Title I in HEP   Status Achieved   PT SHORT TERM GOAL #2   Title Berg improved by 5 levels to decrease risk of falling   Status Achieved           PT Long Term Goals - 01/27/15 1716    PT LONG TERM GOAL #1   Title I in advance HEP   Status Achieved  PT LONG TERM GOAL #2   Title Pt to be walking 15 minutes a day at least three times a week for better health habitsl   Status Achieved   PT LONG TERM GOAL #3   Title Berg score to be improved by 10 pts to allow pt to be safe to walk without the use of a cane.    Status Achieved   PT LONG TERM GOAL #4   Title Pt mm strength improved by one level to improve power for sit to stand and stairclimbing activity.   Status Achieved   PT LONG TERM GOAL #5   Title Patient will be able to walk on unstable surfaces and in crowds without loss of balance.    Time 2   Period Weeks   Status New               Plan - 02/11/15 1613    Clinical Impression Statement Added cognitive challenge with balance activities which increased challenge to pt.  Pt did well with new task of braiding but had difficulty with toe walking.  Pt needed therapist facilitation on approximately 50% of tasks    PT Next Visit Plan begin cone rotation on foam next visit.         Problem List Patient Active Problem List   Diagnosis Date Noted   . Cervical pain 11/18/2012  . Ankylosing spondylitis 11/18/2012  . Esophageal motility disorder 01/22/2012  . GERD (gastroesophageal reflux disease) 01/22/2012  . GERD 03/08/2009  . DEMENTIA 03/07/2009  . RAYNAUD'S SYNDROME 03/07/2009  . ESOPHAGEAL STRICTURE 03/07/2009  . ESOPHAGEAL MOTILITY DISORDER 03/07/2009  . GASTROPARESIS 03/07/2009  . HIATAL HERNIA 03/07/2009  . DIVERTICULOSIS, COLON 03/07/2009  . SCLERODERMA 03/07/2009  . CREST SYNDROME 03/07/2009   Rayetta Humphrey PT  [phone]  02/11/2015, 4:16 PM  [facility] [address] [place], [place], [address] Phone: [phone]   Fax:  [phone]

## 2015-02-14 ENCOUNTER — Ambulatory Visit (HOSPITAL_COMMUNITY): Payer: PPO | Admitting: Physical Therapy

## 2015-02-14 DIAGNOSIS — R29898 Other symptoms and signs involving the musculoskeletal system: Secondary | ICD-10-CM

## 2015-02-14 DIAGNOSIS — R2689 Other abnormalities of gait and mobility: Secondary | ICD-10-CM

## 2015-02-14 DIAGNOSIS — R262 Difficulty in walking, not elsewhere classified: Secondary | ICD-10-CM

## 2015-02-14 NOTE — Therapy (Signed)
[facility] [address] [place], [place], [address] Phone: [phone]   Fax:  [phone]  Physical Therapy Treatment  Patient Details  Name: Robert Carey MRN: [id number] Date of Birth: [birth year] Referring Provider:  Leigh Aurora, MD  Encounter Date: 02/14/2015      PT End of Session - 02/14/15 1731    Visit Number 18   Number of Visits 19   Date for PT Re-Evaluation 02/22/15   Authorization Type Medicare   Authorization - Visit Number 18   Authorization - Number of Visits 19   PT Start Time 3532   PT Stop Time 1730   PT Time Calculation (min) 45 min   Equipment Utilized During Treatment Gait belt   Activity Tolerance Patient tolerated treatment well   Behavior During Therapy [facility] for tasks assessed/performed      Past Medical History  Diagnosis Date  . Diverticulosis of colon (without mention of hemorrhage)   . Esophageal reflux   . Hiatal hernia   . Stricture and stenosis of esophagus   . Scleroderma   . Motility disorder, esophageal   . Gastroparesis   . CREST syndrome   . Raynauds phenomenon   . Seizure disorder   . Dementia   . Spondylitis, ankylosing   . Mixed connective tissue disease     Past Surgical History  Procedure Laterality Date  . Toe surgery      right great toe  . Thumb arthroscopy Left   . Wisdom tooth extraction    . Nose surgery  2005    There were no vitals filed for this visit.  Visit Diagnosis:  Poor balance  Bilateral leg weakness  Difficulty walking      Subjective Assessment - 02/14/15 1734    Symptoms Pt reports no pain or difficulties.  States he is more stable.    Currently in Pain? No/denies            [facility] Adult PT Treatment/Exercise - 02/14/15 1730    Lumbar Exercises: Standing   Heel Raises Limitations heelwalk/toewalk 2RT   Other Standing Lumbar Exercises theraband hip abduction 2RT red   Other Standing Lumbar Exercises cone rotations with foam 2rt,  hurdles 2RT varied height             Balance Exercises - 02/14/15 1649    Balance Exercises: Standing   Standing Eyes Closed Narrow base of support (BOS);Head turns;1 rep;30 secs   Tandem Stance Eyes open;Eyes closed;1 rep;30 secs   Wall Bumps Shoulder   Wall Bumps-Shoulders Eyes opened;10 reps   Tandem Gait Forward;Retro;Foam/compliant surface;2 reps;Limitations   Marching Limitations march walk 1RT wood hall   Other Standing Exercises grapevine 2RT wood hall, agility ladder drills fwd and bkwd 5RT total   Balance Exercises: Seated   Other Seated Exercises sit to stand x 10 with Rt foot back/ 10 with Lt back              PT Short Term Goals - 01/27/15 1718    PT SHORT TERM GOAL #1   Title I in HEP   Status Achieved   PT SHORT TERM GOAL #2   Title Berg improved by 5 levels to decrease risk of falling   Status Achieved           PT Long Term Goals - 01/27/15 1716    PT LONG TERM GOAL #1   Title I in advance HEP   Status Achieved   PT LONG  TERM GOAL #2   Title Pt to be walking 15 minutes a day at least three times a week for better health habitsl   Status Achieved   PT LONG TERM GOAL #3   Title Berg score to be improved by 10 pts to allow pt to be safe to walk without the use of a cane.    Status Achieved   PT LONG TERM GOAL #4   Title Pt mm strength improved by one level to improve power for sit to stand and stairclimbing activity.   Status Achieved   PT LONG TERM GOAL #5   Title Patient will be able to walk on unstable surfaces and in crowds without loss of balance.    Time 2   Period Weeks   Status New               Plan - 02/14/15 1731    Clinical Impression Statement continued cognitive challenge using agility ladder with forward and backward tasks.  PT with most difficutly completeing tasks backward and with balance beam.  Able to clear hurdles 2RT without incident.  Added cone rotations with foam, however not a challenge for patient to  complete.     PT Next Visit Plan Re-evaluate next visit.         Problem List Patient Active Problem List   Diagnosis Date Noted  . Cervical pain 11/18/2012  . Ankylosing spondylitis 11/18/2012  . Esophageal motility disorder 01/22/2012  . GERD (gastroesophageal reflux disease) 01/22/2012  . GERD 03/08/2009  . DEMENTIA 03/07/2009  . RAYNAUD'S SYNDROME 03/07/2009  . ESOPHAGEAL STRICTURE 03/07/2009  . ESOPHAGEAL MOTILITY DISORDER 03/07/2009  . GASTROPARESIS 03/07/2009  . HIATAL HERNIA 03/07/2009  . DIVERTICULOSIS, COLON 03/07/2009  . SCLERODERMA 03/07/2009  . CREST SYNDROME 03/07/2009    Teena Irani, PTA/CLT [phone] 02/14/2015, 5:35 PM  [facility] [address] [place], [place], [address] Phone: [phone]   Fax:  [phone]

## 2015-02-15 ENCOUNTER — Ambulatory Visit (HOSPITAL_COMMUNITY): Payer: PPO | Admitting: Physical Therapy

## 2015-02-15 DIAGNOSIS — R262 Difficulty in walking, not elsewhere classified: Secondary | ICD-10-CM

## 2015-02-15 DIAGNOSIS — R2689 Other abnormalities of gait and mobility: Secondary | ICD-10-CM | POA: Diagnosis not present

## 2015-02-15 DIAGNOSIS — R29898 Other symptoms and signs involving the musculoskeletal system: Secondary | ICD-10-CM

## 2015-02-15 NOTE — Patient Instructions (Signed)
ONE-LEG STAND TIPS -Progress to a One-Leg stance after the moves can be done on Two-Leg and Toe-Down (non-support foot touching near heel of support foot) stances. If progression to One-Leg stance is challenging, perform drills near a chair or wall to provide support if balance is lost. -Perform EQUAL sets with each leg unless compensatory training is required. -When an exercise is done with one arm, perform a set with each leg as base. -Keep support leg slightly bent as locking may endanger the knee, especially during medicine ball throws. -If you find one leg is weaker or less coordinated, discuss this with your health care professional.     Copyright  VHI. All rights reserved.  With Support   Stand on one leg in neutral spine holding support. Hold _10___ seconds. Repeat on other leg. Do _5___ repetitions, ___3_ sets.  http://bt.exer.us/34   Copyright  VHI. All rights reserved.  Narrowing Stance: Eyes Shut   Stand facing forward, eyes closed. Stand _5-10___ seconds then bring feet slightly closer together. Repeat until feet are touching. Repeat _3___ times. Do__1__ sets.  http://bt.exer.us/260   Copyright  VHI. All rights reserved.  Narrowing Stance: Eyes Shut   Stand facing forward, eyes closed. Stand ____ seconds then bring feet slightly closer together. Repeat until feet are touching. Repeat ____ times. Do____ sets.  http://bt.exer.us/260   Copyright  VHI. All rights reserved.

## 2015-02-15 NOTE — Therapy (Signed)
[facility] [place], [place], [address] Phone: [phone]   Fax:  [phone]  Physical Therapy Reassessment  Patient Details  Name: Robert Carey MRN: [id number] Date of Birth: [birth year] Referring Provider:  Leigh Aurora, MD  Encounter Date: 02/15/2015      PT End of Session - 02/15/15 1513    Visit Number 19   Number of Visits 18   Authorization - Visit Number 19   Authorization - Number of Visits 19   PT Start Time 1430   PT Stop Time 1510   PT Time Calculation (min) 40 min   Equipment Utilized During Treatment Gait belt      Past Medical History  Diagnosis Date  . Diverticulosis of colon (without mention of hemorrhage)   . Esophageal reflux   . Hiatal hernia   . Stricture and stenosis of esophagus   . Scleroderma   . Motility disorder, esophageal   . Gastroparesis   . CREST syndrome   . Raynauds phenomenon   . Seizure disorder   . Dementia   . Spondylitis, ankylosing   . Mixed connective tissue disease     Past Surgical History  Procedure Laterality Date  . Toe surgery      right great toe  . Thumb arthroscopy Left   . Wisdom tooth extraction    . Nose surgery  2005    There were no vitals filed for this visit.  Visit Diagnosis:  Poor balance  Bilateral leg weakness  Difficulty walking      Subjective Assessment - 02/15/15 1432    Symptoms Pt has no complaints. Pt states he is doing his exercises    How long can you stand comfortably? Able to stand for 20-30 minutes    How long can you walk comfortably? Pt does not walk for exercise nor is he interested in doing so.    Currently in Pain? No/denies   Pain Score 0-No pain            [facility] PT Assessment - 02/15/15 0001    Assessment   Medical Diagnosis falls    Onset Date 11/19/13   Next MD Visit 02/18/2015   Prior Therapy OP since 12-02-14   Precautions   Precautions Fall   Balance Screen   Has the patient fallen in the  past 6 months Yes   How many times? 2   Has the patient had a decrease in activity level because of a fear of falling?  No   Is the patient reluctant to leave their home because of a fear of falling?  No   Prior Function   Level of Independence Independent with basic ADLs   Cognition   Overall Cognitive Status Within Functional Limits for tasks assessed   Functional Tests   Functional tests Sit to Stand   Sit to Stand   Comments 14 in 30 seconds    Strength   Right Hip Extension 5/5  was 4/5   Right Hip ABduction 5/5  was 4/5   Left Hip Flexion 5/5   Left Hip Extension 5/5  was 4/5   Left Hip ABduction 5/5  was 4/5    Ambulation/Gait   Gait Comments 6 minute walk 1.3 m/s = better than community ambulator status, 420mters   BFurniture conservator/restorer  Sit to Stand Able to stand without using hands and stabilize independently   Standing Unsupported Able to stand safely 2 minutes   Sitting  with Back Unsupported but Feet Supported on Floor or Stool Able to sit safely and securely 2 minutes   Stand to Sit Sits safely with minimal use of hands   Transfers Able to transfer safely, minor use of hands   Standing Unsupported with Eyes Closed Able to stand 10 seconds safely   Standing Ubsupported with Feet Together Able to place feet together independently and stand 1 minute safely   From Standing, Reach Forward with Outstretched Arm Can reach confidently >25 cm (10")   From Standing Position, Pick up Object from Floor Able to pick up shoe safely and easily   From Standing Position, Turn to Look Behind Over each Shoulder Looks behind one side only/other side shows less weight shift   Turn 360 Degrees Able to turn 360 degrees safely one side only in 4 seconds or less   Standing Unsupported, Alternately Place Feet on Step/Stool Able to stand independently and safely and complete 8 steps in 20 seconds   Standing Unsupported, One Foot in Front Able to plae foot ahead of the other independently and  hold 30 seconds   Standing on One Leg Tries to lift leg/unable to hold 3 seconds but remains standing independently   Total Score 50 was 40 on initial evaluation                    [facility] Adult PT Treatment/Exercise - 02/15/15 0001    Lumbar Exercises: Standing   Other Standing Lumbar Exercises SLS x 2 bilaterally; tandem stance x 3 bilaterally; narrow base of support with eyes closed and head turns x 3.             Balance Exercises - 02/14/15 1649    Balance Exercises: Standing   Standing Eyes Closed Narrow base of support (BOS);Head turns;1 rep;30 secs   Tandem Stance Eyes open;Eyes closed;1 rep;30 secs   Wall Bumps Shoulder   Wall Bumps-Shoulders Eyes opened;10 reps   Tandem Gait Forward;Retro;Foam/compliant surface;2 reps;Limitations   Marching Limitations march walk 1RT wood hall   Other Standing Exercises grapevine 2RT wood hall, agility ladder drills fwd and bkwd 5RT total   Balance Exercises: Seated   Other Seated Exercises sit to stand x 10 with Rt foot back/ 10 with Lt back            PT Education - 02/15/15 1511    Education provided Yes   Education Details New HEP for eyes closed with narrow base of support and single leg stance   Person(s) Educated Patient   Methods Explanation   Comprehension Verbalized understanding;Returned demonstration          PT Short Term Goals - 02/15/15 1456    PT SHORT TERM GOAL #1   Title I in HEP   Time 2   Period Weeks   Status Achieved   PT SHORT TERM GOAL #2   Title Berg improved by 5 levels to decrease risk of falling   Baseline 50  on 02/14/2015 was 40   Time 2   Period Weeks   Status Achieved           PT Long Term Goals - 02/15/15 1457    PT LONG TERM GOAL #1   Title I in advance HEP   Time 4   Period Weeks   Status Achieved   PT LONG TERM GOAL #2   Title Pt to be walking 15 minutes a day at least three times a week for better health habitsl  Time 4   Period Weeks   Status Not Met  Pt  does not want to walk   PT LONG TERM GOAL #3   Title Berg score to be improved by 10 pts to allow pt to be safe to walk without the use of a cane.    Time 4   Period Weeks   Status Achieved   PT LONG TERM GOAL #4   Title Pt mm strength improved by one level to improve power for sit to stand and stairclimbing activity.   Time 4   Period Weeks   Status Achieved   PT LONG TERM GOAL #5   Title Patient will be able to walk on unstable surfaces and in crowds without loss of balance.    Time 2   Period Weeks   Status Achieved               Plan - 17-Mar-2015 1515    Clinical Impression Statement Pt seen for reassessment.  Pt strength all 5/5 with Berg 50/54.  Therapist educated pt on the importance of doing some balance activity everyday as well as the benefits of walking.  Pt pleased with progress and agrees with discharge.    PT Next Visit Plan discharge pt  All goals achieved           G-Codes - 03-17-2015 1519    Functional Assessment Tool Used fotro   Functional Limitation Mobility: Walking and moving around   Mobility: Walking and Moving Around Goal Status [phone]) At least 20 percent but less than 40 percent impaired, limited or restricted   Mobility: Walking and Moving Around Discharge Status [phone]) At least 20 percent but less than 40 percent impaired, limited or restricted       Problem List Patient Active Problem List   Diagnosis Date Noted  . Cervical pain 11/18/2012  . Ankylosing spondylitis 11/18/2012  . Esophageal motility disorder 01/22/2012  . GERD (gastroesophageal reflux disease) 01/22/2012  . GERD 03/08/2009  . DEMENTIA 03/07/2009  . RAYNAUD'S SYNDROME 03/07/2009  . ESOPHAGEAL STRICTURE 03/07/2009  . ESOPHAGEAL MOTILITY DISORDER 03/07/2009  . GASTROPARESIS 03/07/2009  . HIATAL HERNIA 03/07/2009  . DIVERTICULOSIS, COLON 03/07/2009  . SCLERODERMA 03/07/2009  . CREST SYNDROME 03/07/2009   Rayetta Humphrey, PT CLT [phone] 17-Mar-2015, 3:20  PM  [facility] [address] [place], [place], [address] Phone: [phone]   Fax:  [phone] PHYSICAL THERAPY DISCHARGE SUMMARY   Plan: Patient agrees to discharge.  Patient goals were met. Patient is being discharged due to meeting the stated rehab goals.  ?????     Rayetta Humphrey, [place] CLT [phone]

## 2015-03-21 ENCOUNTER — Encounter: Payer: Self-pay | Admitting: Internal Medicine

## 2015-03-21 ENCOUNTER — Ambulatory Visit (INDEPENDENT_AMBULATORY_CARE_PROVIDER_SITE_OTHER): Payer: PPO | Admitting: Internal Medicine

## 2015-03-21 VITALS — BP 116/70 | HR 76 | Ht 69.75 in | Wt 146.5 lb

## 2015-03-21 DIAGNOSIS — K219 Gastro-esophageal reflux disease without esophagitis: Secondary | ICD-10-CM

## 2015-03-21 DIAGNOSIS — K5731 Diverticulosis of large intestine without perforation or abscess with bleeding: Secondary | ICD-10-CM

## 2015-03-21 DIAGNOSIS — K224 Dyskinesia of esophagus: Secondary | ICD-10-CM | POA: Diagnosis not present

## 2015-03-21 MED ORDER — OMEPRAZOLE 40 MG PO CPDR: 40.0000 mg | DELAYED_RELEASE_CAPSULE | Freq: Every day | ORAL | Status: DC

## 2015-03-21 NOTE — Progress Notes (Signed)
HISTORY OF PRESENT ILLNESS:  Robert Carey is a 69 y.o. male with multiple significant medical problems. Previous patient Dr. Verl Blalock until April 2015 when I assumed his care. 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 He is accompanied today by his wife. She asked multiple questions and takes notes to assist with his care. At the time of the last visit the patient was continued on twice a day PPI. Told to discontinue metoclopramide which he did. Last colonoscopy 2006 with diverticulosis. They have questions today regarding PPI. Previously on AcipHex 20 mg in the morning and omeprazole 20 mg at night. Having trouble with the insurance company regarding approval for AcipHex. He went to once daily PPI without experiencing any obvious problems such as pyrosis or dysphagia. His wife reports 10 pound weight loss associated with decreased appetite. Records show 5 pound weight loss. She is providing him with nutritional supplements such as boost. Patient's GI review of systems is otherwise negative. They are interested in him having his follow-up screening colonoscopy.  REVIEW OF SYSTEMS:  All non-GI ROS negative except for confusion, back pain, arthritis, skin rash  Past Medical History  Diagnosis Date  . Diverticulosis of colon (without mention of hemorrhage)   . Esophageal reflux   . Hiatal hernia   . Stricture and stenosis of esophagus   . Scleroderma   . Motility disorder, esophageal   . Gastroparesis   . CREST syndrome   . Raynauds phenomenon   . Seizure disorder   . Dementia   . Spondylitis, ankylosing   . Mixed connective tissue disease     Past Surgical History  Procedure Laterality Date  . Toe surgery      right great toe  . Thumb arthroscopy Left   . Wisdom tooth extraction    . Nose surgery  2005     Social History HERBIE LEHRMANN  reports that he has never smoked. He has never used smokeless tobacco. He reports that he does not drink alcohol or use illicit drugs.  family history includes Breast cancer in his mother; COPD in his father; Diabetes in his mother; Heart disease in his father and mother; Pancreatic cancer in his mother. There is no history of Colon cancer.  Allergies  Allergen Reactions  . Dilantin [Phenytoin Sodium Extended]   . Phenytoin Sodium Extended Rash       PHYSICAL EXAMINATION: Vital signs: BP 116/70 mmHg  Pulse 76  Ht 5' 9.75" (1.772 m)  Wt 146 lb 8 oz (66.452 kg)  BMI 21.16 kg/m2  Constitutional: Thin, generally well-appearing, no acute distress Psychiatric: alert and oriented x3, cooperative Eyes: extraocular movements intact, anicteric, conjunctiva pink Mouth: oral pharynx moist, no lesions Neck: supple no lymphadenopathy Cardiovascular: heart regular rate and rhythm, no murmur Lungs: clear to auscultation bilaterally Abdomen: soft, nontender, nondistended, no obvious ascites, no peritoneal signs, normal bowel sounds, no organomegaly Rectal: Deferred until colonoscopy Extremities: no lower extremity edema bilaterally Skin: no lesions on visible extremities Neuro: No focal deficits.    ASSESSMENT:  #1. Esophageal dysmotility associated with CREST. Asymptomatic currently #2. GERD. Issues with PPI as described above #3. Colon cancer screening. Due for follow-up. Last exam February 2006 with diverticulosis. Wife has multiple questions regarding colonoscopy prep.   PLAN:  #1. Reflux precautions #2. Change to omeprazole 40 mg daily as solo PPI agent #3. Screening colonoscopy. Cologard discussed.  They will schedule colonoscopy later this year. Recommend lower volume prep and metoclopramide along with the prep to help with nausea.The nature of the procedure, as well as the risks, benefits, and alternatives were carefully and thoroughly  reviewed with the patient. Ample time for discussion and questions allowed. The patient understood, was satisfied, and agreed to proceed.  25 minutes spent face-to-face with the patient and his wife. Greater than 50% of the time spent counseling the patient and answering multiple questions

## 2015-03-21 NOTE — Patient Instructions (Signed)
We have sent the following medications to your pharmacy for you to pick up at your convenience: Omeprazole  Return to see Dr. Henrene Pastor as needed.

## 2015-05-20 IMAGING — CT CT HEAD W/O CM
4 of 6 series · 14 of 47 positions shown, 16 images · non-contrast
Comparison: None.

CLINICAL DATA: Fall.  Head injury

EXAM:
CT HEAD WITHOUT CONTRAST
CT CERVICAL SPINE WITHOUT CONTRAST
TECHNIQUE: Multidetector CT imaging of the head and cervical spine was
performed following the standard protocol without intravenous
contrast. Multiplanar CT image reconstructions of the cervical spine
were also generated.

[Series 2: headseq 4.8 h37s · axial · 0.47mm/px · z∈[+1330,+1389]mm · 2 of 36 slices shown]
[im 12/36  brain]
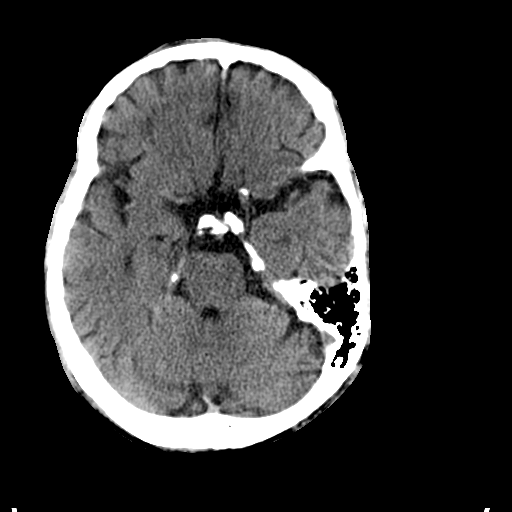
[im 24/36  brain]
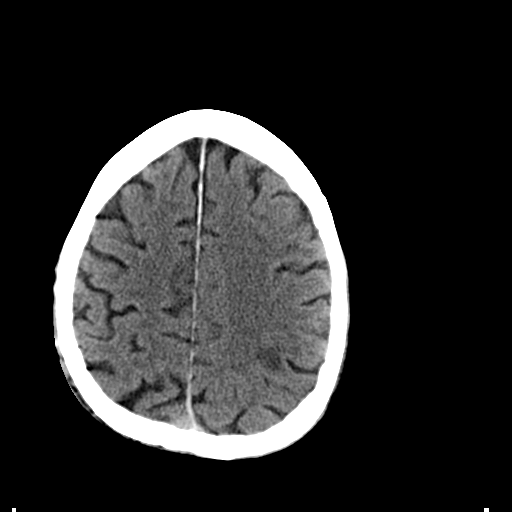

[Series 3: headseq 2.4 h60s · axial · 0.47mm/px · z∈[+1299,+1422]mm · 6 of 72 slices shown, 8 images]
[im 11/72  brain]
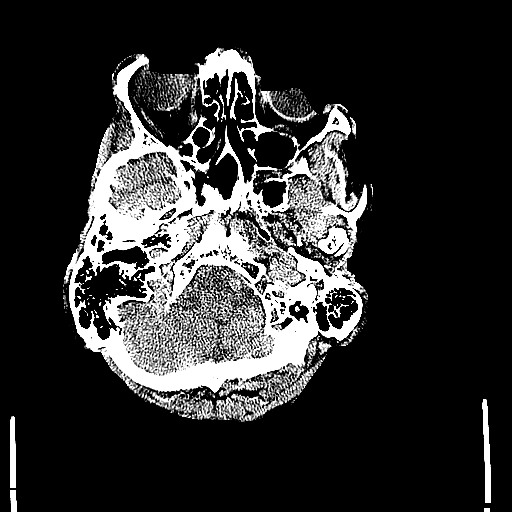
[im 11/72  bone]
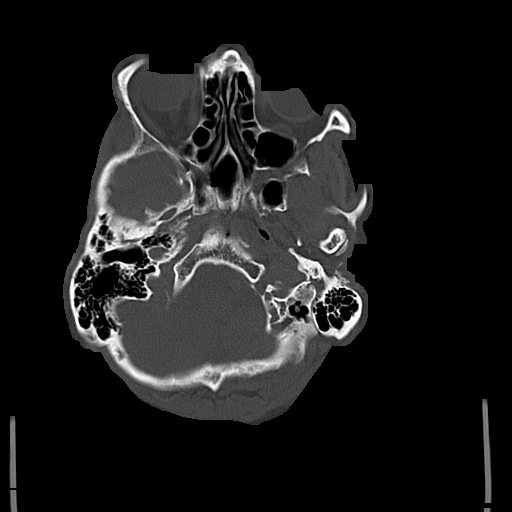
[im 21/72  brain]
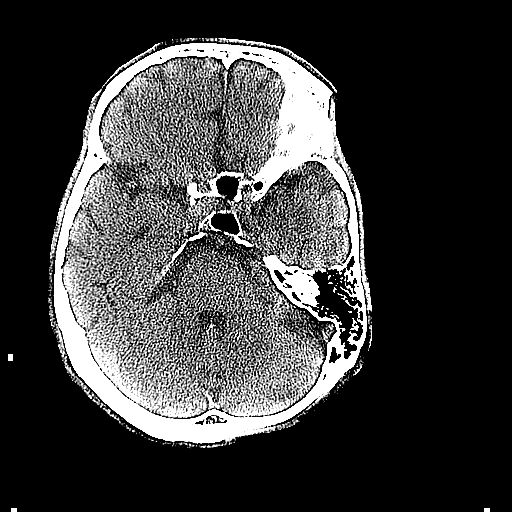
[im 31/72  brain]
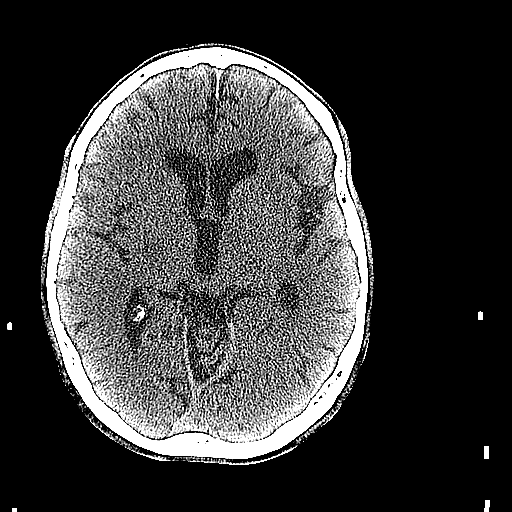
[im 41/72  brain]
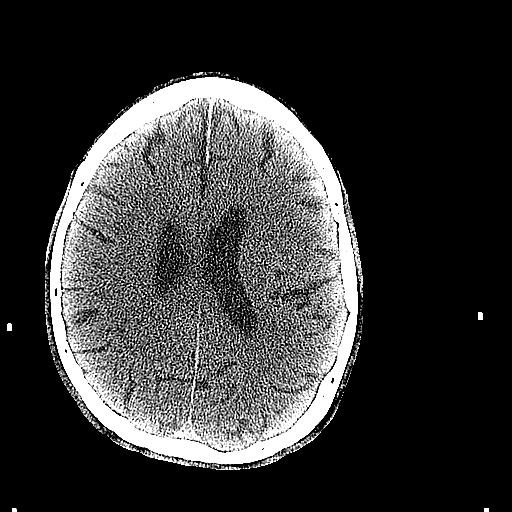
[im 51/72  brain]
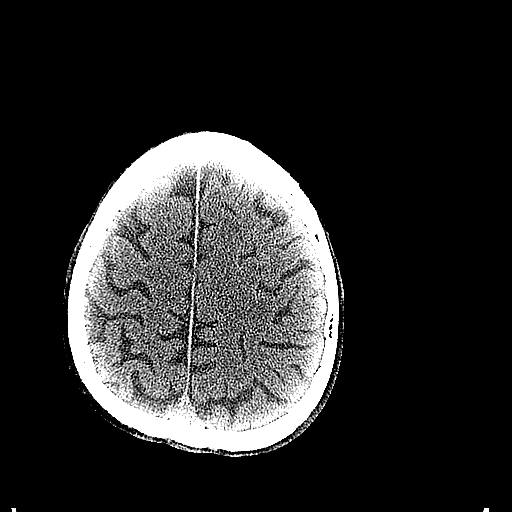
[im 51/72  bone]
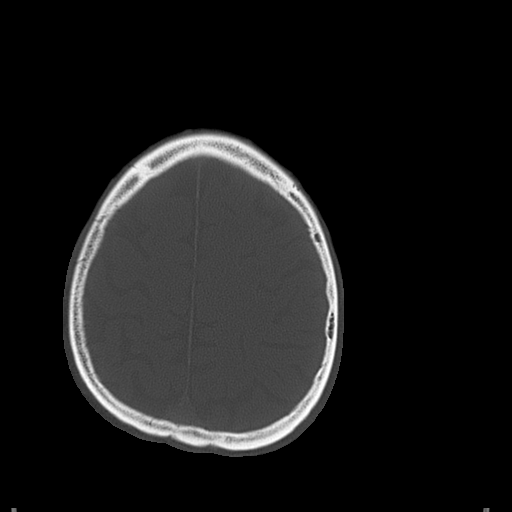
[im 61/72  brain]
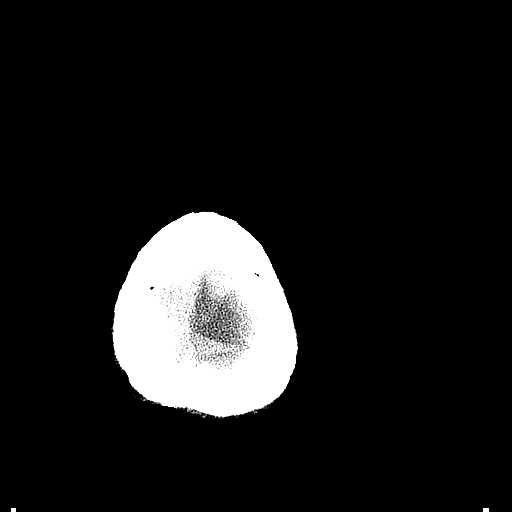

[Series 7: sagittal bone 2.0 · sagittal · 0.25mm/px · 3 of 59 slices shown]
[im 20/59  brain]
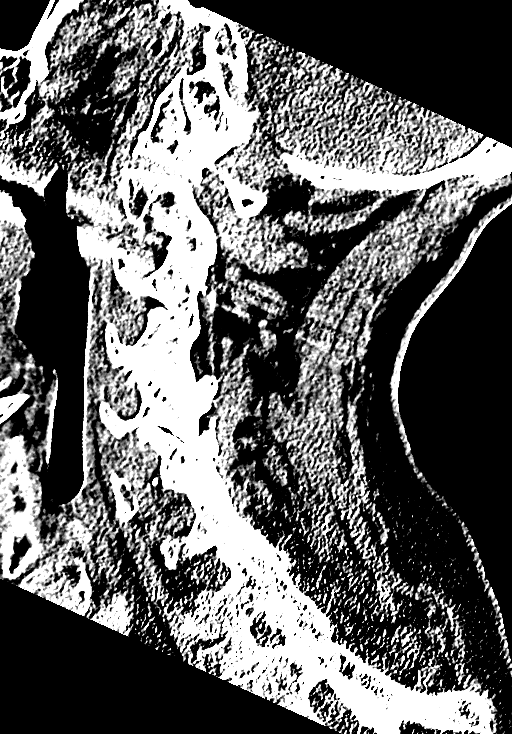
[im 30/59  brain]
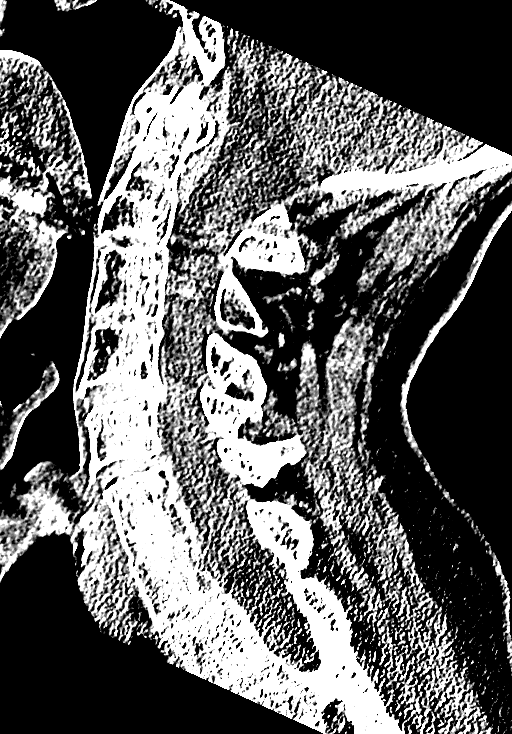
[im 39/59  brain]
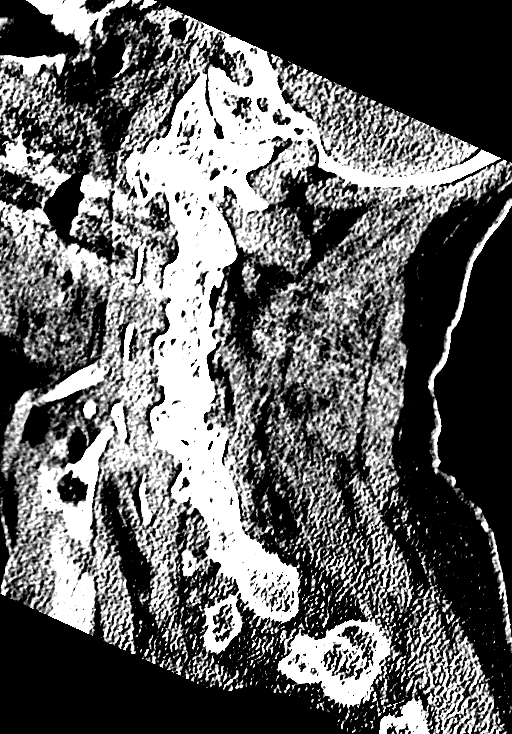

[Series 8: coronal bone 2.0 · coronal · 0.21mm/px · 3 of 52 slices shown]
[im 18/52  brain]
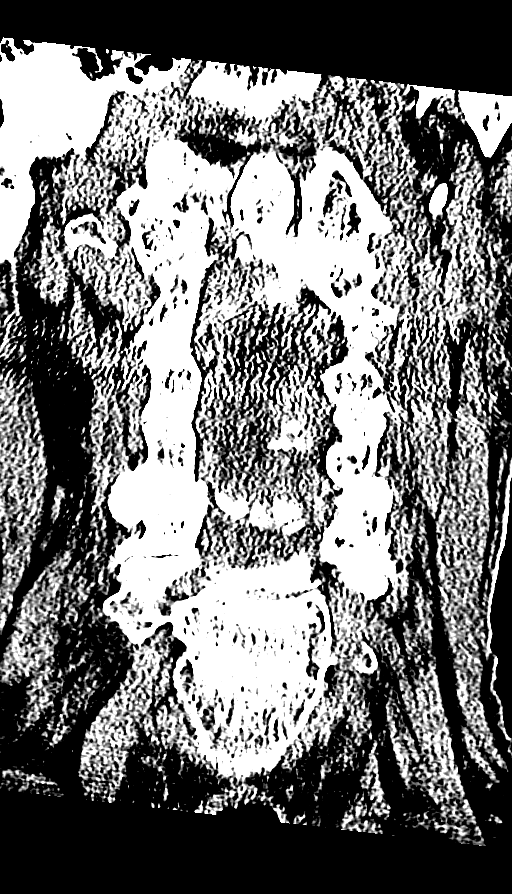
[im 23/52  brain]
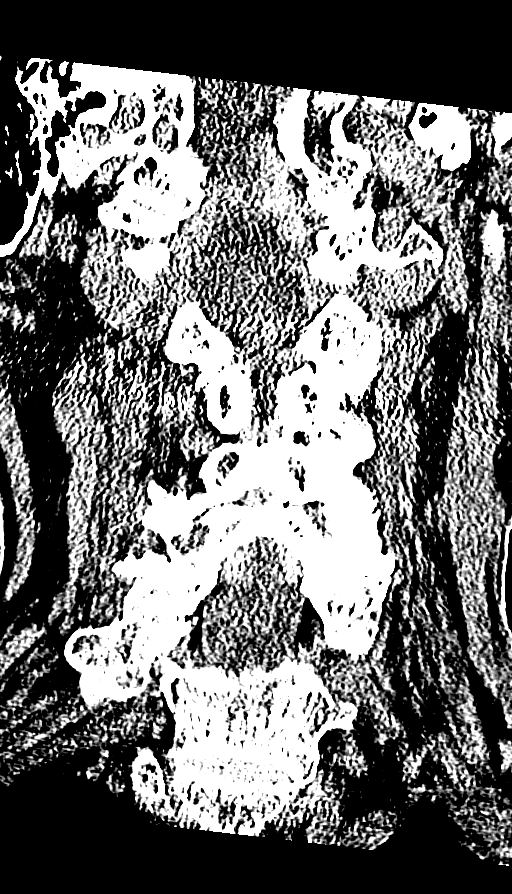
[im 29/52  brain]
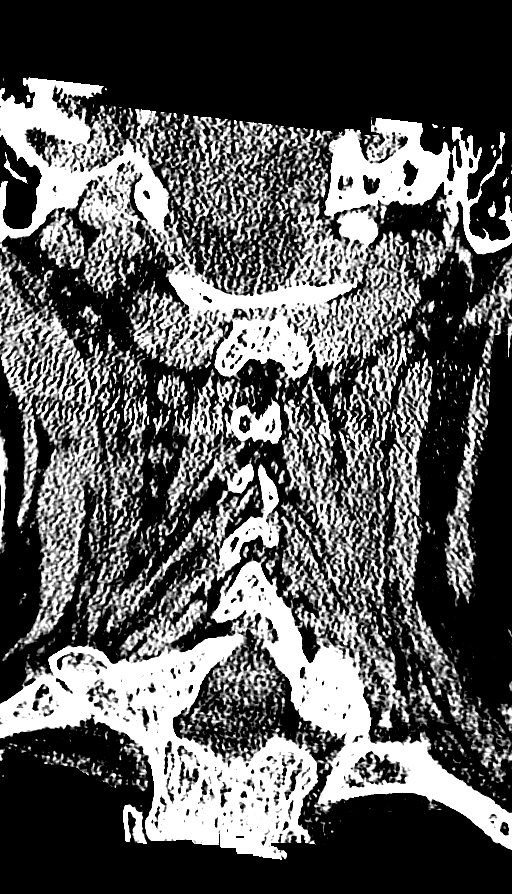

[14 of 47 positions shown; findings below may reference images not displayed]

FINDINGS: CT HEAD FINDINGS

Frontal scalp laceration and soft tissue swelling. 1 mm foreign body
in the soft tissues in this area. Negative for skull fracture

Mild atrophy. Chronic infarct in the left parietal white matter. No
acute infarct. Negative for hemorrhage or mass.

CT CERVICAL SPINE FINDINGS

Ankylosis of C2, C3, and C4 involving the vertebral bodies and
posterior elements. There is disc and facet degeneration at C4-5 and
C5-6. Ankylosis at C6-7 and C7-T1 involving vertebral bodies and
posterior elements.

Negative for fracture.  No mass lesion identified.
IMPRESSION: Frontal scalp laceration with 1 mm foreign body in the scalp.

Atrophy and chronic ischemia.  No acute intracranial abnormality.

Cervical ankylosis consistent with ankylosing spondylitis. Negative
for fracture.

## 2015-12-13 DIAGNOSIS — M79672 Pain in left foot: Secondary | ICD-10-CM | POA: Diagnosis not present

## 2015-12-13 DIAGNOSIS — L851 Acquired keratosis [keratoderma] palmaris et plantaris: Secondary | ICD-10-CM | POA: Diagnosis not present

## 2015-12-20 DIAGNOSIS — R569 Unspecified convulsions: Secondary | ICD-10-CM | POA: Diagnosis not present

## 2015-12-20 DIAGNOSIS — Z7982 Long term (current) use of aspirin: Secondary | ICD-10-CM | POA: Diagnosis not present

## 2015-12-20 DIAGNOSIS — Z8661 Personal history of infections of the central nervous system: Secondary | ICD-10-CM | POA: Diagnosis not present

## 2015-12-20 DIAGNOSIS — R413 Other amnesia: Secondary | ICD-10-CM | POA: Diagnosis not present

## 2015-12-20 DIAGNOSIS — G40209 Localization-related (focal) (partial) symptomatic epilepsy and epileptic syndromes with complex partial seizures, not intractable, without status epilepticus: Secondary | ICD-10-CM | POA: Diagnosis not present

## 2015-12-20 DIAGNOSIS — Z79899 Other long term (current) drug therapy: Secondary | ICD-10-CM | POA: Diagnosis not present

## 2015-12-22 DIAGNOSIS — Z23 Encounter for immunization: Secondary | ICD-10-CM | POA: Diagnosis not present

## 2015-12-23 DIAGNOSIS — M351 Other overlap syndromes: Secondary | ICD-10-CM | POA: Diagnosis not present

## 2015-12-23 DIAGNOSIS — M450 Ankylosing spondylitis of multiple sites in spine: Secondary | ICD-10-CM | POA: Diagnosis not present

## 2015-12-23 DIAGNOSIS — R296 Repeated falls: Secondary | ICD-10-CM | POA: Diagnosis not present

## 2015-12-23 DIAGNOSIS — M542 Cervicalgia: Secondary | ICD-10-CM | POA: Diagnosis not present

## 2015-12-23 DIAGNOSIS — I7300 Raynaud's syndrome without gangrene: Secondary | ICD-10-CM | POA: Diagnosis not present

## 2015-12-23 DIAGNOSIS — M810 Age-related osteoporosis without current pathological fracture: Secondary | ICD-10-CM | POA: Diagnosis not present

## 2016-01-10 DIAGNOSIS — G40209 Localization-related (focal) (partial) symptomatic epilepsy and epileptic syndromes with complex partial seizures, not intractable, without status epilepticus: Secondary | ICD-10-CM | POA: Diagnosis not present

## 2016-01-10 DIAGNOSIS — A86 Unspecified viral encephalitis: Secondary | ICD-10-CM | POA: Diagnosis not present

## 2016-01-10 DIAGNOSIS — Z888 Allergy status to other drugs, medicaments and biological substances status: Secondary | ICD-10-CM | POA: Diagnosis not present

## 2016-01-10 DIAGNOSIS — M810 Age-related osteoporosis without current pathological fracture: Secondary | ICD-10-CM | POA: Diagnosis not present

## 2016-01-10 DIAGNOSIS — R413 Other amnesia: Secondary | ICD-10-CM | POA: Diagnosis not present

## 2016-01-10 DIAGNOSIS — Z7952 Long term (current) use of systemic steroids: Secondary | ICD-10-CM | POA: Diagnosis not present

## 2016-01-10 DIAGNOSIS — Z791 Long term (current) use of non-steroidal anti-inflammatories (NSAID): Secondary | ICD-10-CM | POA: Diagnosis not present

## 2016-01-10 DIAGNOSIS — Z7982 Long term (current) use of aspirin: Secondary | ICD-10-CM | POA: Diagnosis not present

## 2016-01-10 DIAGNOSIS — G40909 Epilepsy, unspecified, not intractable, without status epilepticus: Secondary | ICD-10-CM | POA: Insufficient documentation

## 2016-01-10 DIAGNOSIS — Z79899 Other long term (current) drug therapy: Secondary | ICD-10-CM | POA: Diagnosis not present

## 2016-01-10 DIAGNOSIS — G40109 Localization-related (focal) (partial) symptomatic epilepsy and epileptic syndromes with simple partial seizures, not intractable, without status epilepticus: Secondary | ICD-10-CM | POA: Diagnosis not present

## 2016-01-10 DIAGNOSIS — M459 Ankylosing spondylitis of unspecified sites in spine: Secondary | ICD-10-CM | POA: Diagnosis not present

## 2016-01-11 DIAGNOSIS — G40209 Localization-related (focal) (partial) symptomatic epilepsy and epileptic syndromes with complex partial seizures, not intractable, without status epilepticus: Secondary | ICD-10-CM | POA: Diagnosis not present

## 2016-01-11 DIAGNOSIS — M459 Ankylosing spondylitis of unspecified sites in spine: Secondary | ICD-10-CM | POA: Diagnosis not present

## 2016-01-11 DIAGNOSIS — A86 Unspecified viral encephalitis: Secondary | ICD-10-CM | POA: Diagnosis not present

## 2016-01-12 DIAGNOSIS — R413 Other amnesia: Secondary | ICD-10-CM | POA: Diagnosis not present

## 2016-01-12 DIAGNOSIS — G40109 Localization-related (focal) (partial) symptomatic epilepsy and epileptic syndromes with simple partial seizures, not intractable, without status epilepticus: Secondary | ICD-10-CM | POA: Diagnosis not present

## 2016-01-13 DIAGNOSIS — G40109 Localization-related (focal) (partial) symptomatic epilepsy and epileptic syndromes with simple partial seizures, not intractable, without status epilepticus: Secondary | ICD-10-CM | POA: Diagnosis not present

## 2016-01-13 DIAGNOSIS — R413 Other amnesia: Secondary | ICD-10-CM | POA: Diagnosis not present

## 2016-01-24 DIAGNOSIS — M79672 Pain in left foot: Secondary | ICD-10-CM | POA: Diagnosis not present

## 2016-01-24 DIAGNOSIS — L84 Corns and callosities: Secondary | ICD-10-CM | POA: Diagnosis not present

## 2016-01-31 DIAGNOSIS — H2513 Age-related nuclear cataract, bilateral: Secondary | ICD-10-CM | POA: Diagnosis not present

## 2016-02-28 ENCOUNTER — Other Ambulatory Visit: Payer: Self-pay | Admitting: Internal Medicine

## 2016-02-28 DIAGNOSIS — G40109 Localization-related (focal) (partial) symptomatic epilepsy and epileptic syndromes with simple partial seizures, not intractable, without status epilepticus: Secondary | ICD-10-CM | POA: Diagnosis not present

## 2016-02-28 DIAGNOSIS — G40209 Localization-related (focal) (partial) symptomatic epilepsy and epileptic syndromes with complex partial seizures, not intractable, without status epilepticus: Secondary | ICD-10-CM | POA: Diagnosis not present

## 2016-02-28 DIAGNOSIS — B941 Sequelae of viral encephalitis: Secondary | ICD-10-CM | POA: Diagnosis not present

## 2016-03-06 DIAGNOSIS — M79672 Pain in left foot: Secondary | ICD-10-CM | POA: Diagnosis not present

## 2016-03-06 DIAGNOSIS — L84 Corns and callosities: Secondary | ICD-10-CM | POA: Diagnosis not present

## 2016-03-08 DIAGNOSIS — R569 Unspecified convulsions: Secondary | ICD-10-CM | POA: Diagnosis not present

## 2016-03-08 DIAGNOSIS — Z888 Allergy status to other drugs, medicaments and biological substances status: Secondary | ICD-10-CM | POA: Diagnosis not present

## 2016-03-08 DIAGNOSIS — Z79899 Other long term (current) drug therapy: Secondary | ICD-10-CM | POA: Diagnosis not present

## 2016-03-08 DIAGNOSIS — G40109 Localization-related (focal) (partial) symptomatic epilepsy and epileptic syndromes with simple partial seizures, not intractable, without status epilepticus: Secondary | ICD-10-CM | POA: Diagnosis not present

## 2016-04-20 DIAGNOSIS — R296 Repeated falls: Secondary | ICD-10-CM | POA: Diagnosis not present

## 2016-04-20 DIAGNOSIS — I7300 Raynaud's syndrome without gangrene: Secondary | ICD-10-CM | POA: Diagnosis not present

## 2016-04-20 DIAGNOSIS — M542 Cervicalgia: Secondary | ICD-10-CM | POA: Diagnosis not present

## 2016-04-20 DIAGNOSIS — M450 Ankylosing spondylitis of multiple sites in spine: Secondary | ICD-10-CM | POA: Diagnosis not present

## 2016-04-20 DIAGNOSIS — M810 Age-related osteoporosis without current pathological fracture: Secondary | ICD-10-CM | POA: Diagnosis not present

## 2016-04-20 DIAGNOSIS — M351 Other overlap syndromes: Secondary | ICD-10-CM | POA: Diagnosis not present

## 2016-04-24 DIAGNOSIS — L84 Corns and callosities: Secondary | ICD-10-CM | POA: Diagnosis not present

## 2016-04-24 DIAGNOSIS — M79672 Pain in left foot: Secondary | ICD-10-CM | POA: Diagnosis not present

## 2016-05-31 DIAGNOSIS — H9113 Presbycusis, bilateral: Secondary | ICD-10-CM | POA: Insufficient documentation

## 2016-05-31 DIAGNOSIS — H6123 Impacted cerumen, bilateral: Secondary | ICD-10-CM | POA: Diagnosis not present

## 2016-06-06 DIAGNOSIS — H903 Sensorineural hearing loss, bilateral: Secondary | ICD-10-CM | POA: Diagnosis not present

## 2016-06-19 DIAGNOSIS — M79672 Pain in left foot: Secondary | ICD-10-CM | POA: Diagnosis not present

## 2016-06-19 DIAGNOSIS — L84 Corns and callosities: Secondary | ICD-10-CM | POA: Diagnosis not present

## 2016-07-17 DIAGNOSIS — L84 Corns and callosities: Secondary | ICD-10-CM | POA: Diagnosis not present

## 2016-07-17 DIAGNOSIS — M79672 Pain in left foot: Secondary | ICD-10-CM | POA: Diagnosis not present

## 2016-07-30 ENCOUNTER — Other Ambulatory Visit: Payer: Self-pay | Admitting: Internal Medicine

## 2016-08-03 DIAGNOSIS — L97521 Non-pressure chronic ulcer of other part of left foot limited to breakdown of skin: Secondary | ICD-10-CM | POA: Diagnosis not present

## 2016-08-03 DIAGNOSIS — M79672 Pain in left foot: Secondary | ICD-10-CM | POA: Diagnosis not present

## 2016-08-16 DIAGNOSIS — G40109 Localization-related (focal) (partial) symptomatic epilepsy and epileptic syndromes with simple partial seizures, not intractable, without status epilepticus: Secondary | ICD-10-CM | POA: Diagnosis not present

## 2016-08-16 DIAGNOSIS — Z7982 Long term (current) use of aspirin: Secondary | ICD-10-CM | POA: Diagnosis not present

## 2016-08-16 DIAGNOSIS — Z888 Allergy status to other drugs, medicaments and biological substances status: Secondary | ICD-10-CM | POA: Diagnosis not present

## 2016-08-16 DIAGNOSIS — R413 Other amnesia: Secondary | ICD-10-CM | POA: Diagnosis not present

## 2016-08-16 DIAGNOSIS — Z79899 Other long term (current) drug therapy: Secondary | ICD-10-CM | POA: Diagnosis not present

## 2016-08-24 DIAGNOSIS — M542 Cervicalgia: Secondary | ICD-10-CM | POA: Diagnosis not present

## 2016-08-24 DIAGNOSIS — R296 Repeated falls: Secondary | ICD-10-CM | POA: Diagnosis not present

## 2016-08-24 DIAGNOSIS — M810 Age-related osteoporosis without current pathological fracture: Secondary | ICD-10-CM | POA: Diagnosis not present

## 2016-08-24 DIAGNOSIS — I7300 Raynaud's syndrome without gangrene: Secondary | ICD-10-CM | POA: Diagnosis not present

## 2016-08-24 DIAGNOSIS — M351 Other overlap syndromes: Secondary | ICD-10-CM | POA: Diagnosis not present

## 2016-08-24 DIAGNOSIS — M450 Ankylosing spondylitis of multiple sites in spine: Secondary | ICD-10-CM | POA: Diagnosis not present

## 2016-08-28 DIAGNOSIS — M79672 Pain in left foot: Secondary | ICD-10-CM | POA: Diagnosis not present

## 2016-08-28 DIAGNOSIS — L97521 Non-pressure chronic ulcer of other part of left foot limited to breakdown of skin: Secondary | ICD-10-CM | POA: Diagnosis not present

## 2016-08-30 ENCOUNTER — Other Ambulatory Visit: Payer: Self-pay | Admitting: Internal Medicine

## 2016-08-30 DIAGNOSIS — L570 Actinic keratosis: Secondary | ICD-10-CM | POA: Diagnosis not present

## 2016-08-30 DIAGNOSIS — R5381 Other malaise: Secondary | ICD-10-CM

## 2016-08-30 DIAGNOSIS — X32XXXD Exposure to sunlight, subsequent encounter: Secondary | ICD-10-CM | POA: Diagnosis not present

## 2016-08-30 DIAGNOSIS — L308 Other specified dermatitis: Secondary | ICD-10-CM | POA: Diagnosis not present

## 2016-08-30 DIAGNOSIS — Z7952 Long term (current) use of systemic steroids: Secondary | ICD-10-CM

## 2016-08-30 DIAGNOSIS — Z1283 Encounter for screening for malignant neoplasm of skin: Secondary | ICD-10-CM | POA: Diagnosis not present

## 2016-08-30 DIAGNOSIS — D353 Benign neoplasm of craniopharyngeal duct: Secondary | ICD-10-CM | POA: Diagnosis not present

## 2016-08-30 DIAGNOSIS — Q782 Osteopetrosis: Secondary | ICD-10-CM

## 2016-08-30 DIAGNOSIS — L304 Erythema intertrigo: Secondary | ICD-10-CM | POA: Diagnosis not present

## 2016-08-31 ENCOUNTER — Other Ambulatory Visit: Payer: Self-pay | Admitting: Internal Medicine

## 2016-08-31 DIAGNOSIS — Z7952 Long term (current) use of systemic steroids: Secondary | ICD-10-CM

## 2016-09-25 DIAGNOSIS — L851 Acquired keratosis [keratoderma] palmaris et plantaris: Secondary | ICD-10-CM | POA: Diagnosis not present

## 2016-09-25 DIAGNOSIS — M79672 Pain in left foot: Secondary | ICD-10-CM | POA: Diagnosis not present

## 2016-10-10 ENCOUNTER — Ambulatory Visit
Admission: RE | Admit: 2016-10-10 | Discharge: 2016-10-10 | Disposition: A | Discharge status: Home / Self Care | Payer: PPO | Source: Ambulatory Visit | Attending: Internal Medicine | Admitting: Internal Medicine

## 2016-10-10 DIAGNOSIS — Z7952 Long term (current) use of systemic steroids: Secondary | ICD-10-CM

## 2016-10-10 DIAGNOSIS — M85851 Other specified disorders of bone density and structure, right thigh: Secondary | ICD-10-CM | POA: Diagnosis not present

## 2016-10-16 ENCOUNTER — Encounter: Payer: Self-pay | Admitting: Internal Medicine

## 2016-10-23 DIAGNOSIS — M79672 Pain in left foot: Secondary | ICD-10-CM | POA: Diagnosis not present

## 2016-10-23 DIAGNOSIS — L851 Acquired keratosis [keratoderma] palmaris et plantaris: Secondary | ICD-10-CM | POA: Diagnosis not present

## 2016-10-29 DIAGNOSIS — M341 CR(E)ST syndrome: Secondary | ICD-10-CM | POA: Diagnosis not present

## 2016-10-29 DIAGNOSIS — R569 Unspecified convulsions: Secondary | ICD-10-CM | POA: Diagnosis not present

## 2016-10-29 DIAGNOSIS — I7300 Raynaud's syndrome without gangrene: Secondary | ICD-10-CM | POA: Diagnosis not present

## 2016-10-29 DIAGNOSIS — K219 Gastro-esophageal reflux disease without esophagitis: Secondary | ICD-10-CM | POA: Diagnosis not present

## 2016-10-29 DIAGNOSIS — Z125 Encounter for screening for malignant neoplasm of prostate: Secondary | ICD-10-CM | POA: Diagnosis not present

## 2016-10-29 DIAGNOSIS — Z79899 Other long term (current) drug therapy: Secondary | ICD-10-CM | POA: Diagnosis not present

## 2016-10-29 DIAGNOSIS — K3184 Gastroparesis: Secondary | ICD-10-CM | POA: Diagnosis not present

## 2016-11-02 DIAGNOSIS — Z6823 Body mass index (BMI) 23.0-23.9, adult: Secondary | ICD-10-CM | POA: Diagnosis not present

## 2016-11-02 DIAGNOSIS — G4089 Other seizures: Secondary | ICD-10-CM | POA: Diagnosis not present

## 2016-11-02 DIAGNOSIS — M459 Ankylosing spondylitis of unspecified sites in spine: Secondary | ICD-10-CM | POA: Diagnosis not present

## 2016-11-02 DIAGNOSIS — Z0001 Encounter for general adult medical examination with abnormal findings: Secondary | ICD-10-CM | POA: Diagnosis not present

## 2016-11-20 DIAGNOSIS — M79672 Pain in left foot: Secondary | ICD-10-CM | POA: Diagnosis not present

## 2016-11-20 DIAGNOSIS — L851 Acquired keratosis [keratoderma] palmaris et plantaris: Secondary | ICD-10-CM | POA: Diagnosis not present

## 2016-11-26 ENCOUNTER — Other Ambulatory Visit: Payer: Self-pay | Admitting: Internal Medicine

## 2016-12-13 ENCOUNTER — Telehealth: Payer: Self-pay | Admitting: Internal Medicine

## 2016-12-13 ENCOUNTER — Ambulatory Visit (AMBULATORY_SURGERY_CENTER): Payer: Self-pay | Admitting: Internal Medicine

## 2016-12-13 DIAGNOSIS — Z1211 Encounter for screening for malignant neoplasm of colon: Secondary | ICD-10-CM

## 2016-12-13 MED ORDER — NA SULFATE-K SULFATE-MG SULF 17.5-3.13-1.6 GM/177ML PO SOLN: ORAL | 0 refills | Status: DC

## 2016-12-13 NOTE — Telephone Encounter (Signed)
Dr. Henrene Pastor,  Do you think that this patient needs to be seen in the office due to seizure history?  He had one in September, and I needed to change his colonoscopy to 02/13/2017. His wife said that if her doesn't get enough sleep that he might have a seizure.  Please advise.

## 2016-12-13 NOTE — Progress Notes (Signed)
Pt denies allergies to eggs or soy products. Denies difficulty with sedation or anesthesia. Denies any diet or weight loss medications. Denies use of supplemental oxygen.  Emmi given to patient.

## 2016-12-13 NOTE — Telephone Encounter (Signed)
Okay to proceed with colonoscopy. Office visit will not change anything. If he has any concerns, he should check with his neurologist or PCP

## 2016-12-25 DIAGNOSIS — L851 Acquired keratosis [keratoderma] palmaris et plantaris: Secondary | ICD-10-CM | POA: Diagnosis not present

## 2016-12-25 DIAGNOSIS — M79671 Pain in right foot: Secondary | ICD-10-CM | POA: Diagnosis not present

## 2016-12-25 DIAGNOSIS — M7750 Other enthesopathy of unspecified foot: Secondary | ICD-10-CM | POA: Diagnosis not present

## 2016-12-26 DIAGNOSIS — I7300 Raynaud's syndrome without gangrene: Secondary | ICD-10-CM | POA: Diagnosis not present

## 2016-12-26 DIAGNOSIS — R296 Repeated falls: Secondary | ICD-10-CM | POA: Diagnosis not present

## 2016-12-26 DIAGNOSIS — M542 Cervicalgia: Secondary | ICD-10-CM | POA: Diagnosis not present

## 2016-12-26 DIAGNOSIS — Z6822 Body mass index (BMI) 22.0-22.9, adult: Secondary | ICD-10-CM | POA: Diagnosis not present

## 2016-12-26 DIAGNOSIS — M810 Age-related osteoporosis without current pathological fracture: Secondary | ICD-10-CM | POA: Diagnosis not present

## 2016-12-26 DIAGNOSIS — M351 Other overlap syndromes: Secondary | ICD-10-CM | POA: Diagnosis not present

## 2016-12-26 DIAGNOSIS — M450 Ankylosing spondylitis of multiple sites in spine: Secondary | ICD-10-CM | POA: Diagnosis not present

## 2016-12-27 ENCOUNTER — Encounter: Payer: PPO | Admitting: Internal Medicine

## 2017-01-03 DIAGNOSIS — L97519 Non-pressure chronic ulcer of other part of right foot with unspecified severity: Secondary | ICD-10-CM

## 2017-01-03 DIAGNOSIS — L97529 Non-pressure chronic ulcer of other part of left foot with unspecified severity: Secondary | ICD-10-CM

## 2017-01-03 HISTORY — DX: Non-pressure chronic ulcer of other part of right foot with unspecified severity: L97.519

## 2017-01-03 HISTORY — DX: Non-pressure chronic ulcer of other part of right foot with unspecified severity: L97.529

## 2017-01-08 ENCOUNTER — Other Ambulatory Visit (HOSPITAL_COMMUNITY): Payer: Self-pay | Admitting: Podiatry

## 2017-01-08 DIAGNOSIS — M7750 Other enthesopathy of unspecified foot: Secondary | ICD-10-CM | POA: Diagnosis not present

## 2017-01-08 DIAGNOSIS — L97512 Non-pressure chronic ulcer of other part of right foot with fat layer exposed: Secondary | ICD-10-CM | POA: Diagnosis not present

## 2017-01-08 DIAGNOSIS — M79672 Pain in left foot: Secondary | ICD-10-CM

## 2017-01-08 DIAGNOSIS — I739 Peripheral vascular disease, unspecified: Secondary | ICD-10-CM | POA: Diagnosis not present

## 2017-01-08 DIAGNOSIS — L97521 Non-pressure chronic ulcer of other part of left foot limited to breakdown of skin: Secondary | ICD-10-CM

## 2017-01-10 DIAGNOSIS — M7751 Other enthesopathy of right foot: Secondary | ICD-10-CM | POA: Diagnosis not present

## 2017-01-10 DIAGNOSIS — I739 Peripheral vascular disease, unspecified: Secondary | ICD-10-CM | POA: Diagnosis not present

## 2017-01-10 DIAGNOSIS — L97512 Non-pressure chronic ulcer of other part of right foot with fat layer exposed: Secondary | ICD-10-CM | POA: Diagnosis not present

## 2017-01-10 DIAGNOSIS — G40909 Epilepsy, unspecified, not intractable, without status epilepticus: Secondary | ICD-10-CM | POA: Diagnosis not present

## 2017-01-10 DIAGNOSIS — M0609 Rheumatoid arthritis without rheumatoid factor, multiple sites: Secondary | ICD-10-CM | POA: Diagnosis not present

## 2017-01-10 DIAGNOSIS — Z7982 Long term (current) use of aspirin: Secondary | ICD-10-CM | POA: Diagnosis not present

## 2017-01-11 ENCOUNTER — Ambulatory Visit (HOSPITAL_COMMUNITY)
Admission: RE | Admit: 2017-01-11 | Discharge: 2017-01-11 | Disposition: A | Discharge status: Home / Self Care | Payer: PPO | Source: Ambulatory Visit | Attending: Podiatry | Admitting: Podiatry

## 2017-01-11 DIAGNOSIS — M79672 Pain in left foot: Secondary | ICD-10-CM

## 2017-01-11 DIAGNOSIS — L97909 Non-pressure chronic ulcer of unspecified part of unspecified lower leg with unspecified severity: Secondary | ICD-10-CM | POA: Diagnosis not present

## 2017-01-11 DIAGNOSIS — I771 Stricture of artery: Secondary | ICD-10-CM | POA: Diagnosis not present

## 2017-01-11 DIAGNOSIS — L97521 Non-pressure chronic ulcer of other part of left foot limited to breakdown of skin: Secondary | ICD-10-CM

## 2017-01-15 DIAGNOSIS — L97512 Non-pressure chronic ulcer of other part of right foot with fat layer exposed: Secondary | ICD-10-CM | POA: Diagnosis not present

## 2017-01-15 DIAGNOSIS — M79671 Pain in right foot: Secondary | ICD-10-CM | POA: Diagnosis not present

## 2017-01-16 ENCOUNTER — Other Ambulatory Visit: Payer: Self-pay | Admitting: Podiatry

## 2017-01-16 ENCOUNTER — Ambulatory Visit (HOSPITAL_COMMUNITY)
Admission: RE | Admit: 2017-01-16 | Discharge: 2017-01-16 | Disposition: A | Discharge status: Home / Self Care | Payer: PPO | Source: Ambulatory Visit | Attending: Nurse Practitioner | Admitting: Nurse Practitioner

## 2017-01-16 ENCOUNTER — Encounter (HOSPITAL_BASED_OUTPATIENT_CLINIC_OR_DEPARTMENT_OTHER): Payer: PPO | Attending: Internal Medicine

## 2017-01-16 ENCOUNTER — Other Ambulatory Visit: Payer: Self-pay | Admitting: Nurse Practitioner

## 2017-01-16 DIAGNOSIS — Z7952 Long term (current) use of systemic steroids: Secondary | ICD-10-CM | POA: Insufficient documentation

## 2017-01-16 DIAGNOSIS — M86171 Other acute osteomyelitis, right ankle and foot: Secondary | ICD-10-CM

## 2017-01-16 DIAGNOSIS — I7301 Raynaud's syndrome with gangrene: Secondary | ICD-10-CM | POA: Diagnosis not present

## 2017-01-16 DIAGNOSIS — L940 Localized scleroderma [morphea]: Secondary | ICD-10-CM | POA: Diagnosis not present

## 2017-01-16 DIAGNOSIS — L97512 Non-pressure chronic ulcer of other part of right foot with fat layer exposed: Secondary | ICD-10-CM | POA: Diagnosis not present

## 2017-01-16 DIAGNOSIS — I70235 Atherosclerosis of native arteries of right leg with ulceration of other part of foot: Secondary | ICD-10-CM | POA: Insufficient documentation

## 2017-01-16 DIAGNOSIS — L089 Local infection of the skin and subcutaneous tissue, unspecified: Secondary | ICD-10-CM | POA: Diagnosis not present

## 2017-01-16 DIAGNOSIS — L97511 Non-pressure chronic ulcer of other part of right foot limited to breakdown of skin: Secondary | ICD-10-CM

## 2017-01-16 DIAGNOSIS — L89890 Pressure ulcer of other site, unstageable: Secondary | ICD-10-CM | POA: Diagnosis not present

## 2017-01-18 DIAGNOSIS — L97511 Non-pressure chronic ulcer of other part of right foot limited to breakdown of skin: Secondary | ICD-10-CM | POA: Diagnosis not present

## 2017-01-18 DIAGNOSIS — R296 Repeated falls: Secondary | ICD-10-CM | POA: Diagnosis not present

## 2017-01-18 DIAGNOSIS — I7300 Raynaud's syndrome without gangrene: Secondary | ICD-10-CM | POA: Diagnosis not present

## 2017-01-18 DIAGNOSIS — M542 Cervicalgia: Secondary | ICD-10-CM | POA: Diagnosis not present

## 2017-01-18 DIAGNOSIS — M351 Other overlap syndromes: Secondary | ICD-10-CM | POA: Diagnosis not present

## 2017-01-18 DIAGNOSIS — M0609 Rheumatoid arthritis without rheumatoid factor, multiple sites: Secondary | ICD-10-CM | POA: Diagnosis not present

## 2017-01-18 DIAGNOSIS — M810 Age-related osteoporosis without current pathological fracture: Secondary | ICD-10-CM | POA: Diagnosis not present

## 2017-01-18 DIAGNOSIS — Z7982 Long term (current) use of aspirin: Secondary | ICD-10-CM | POA: Diagnosis not present

## 2017-01-18 DIAGNOSIS — M450 Ankylosing spondylitis of multiple sites in spine: Secondary | ICD-10-CM | POA: Diagnosis not present

## 2017-01-18 DIAGNOSIS — Z6822 Body mass index (BMI) 22.0-22.9, adult: Secondary | ICD-10-CM | POA: Diagnosis not present

## 2017-01-18 DIAGNOSIS — G40909 Epilepsy, unspecified, not intractable, without status epilepticus: Secondary | ICD-10-CM | POA: Diagnosis not present

## 2017-01-18 DIAGNOSIS — I739 Peripheral vascular disease, unspecified: Secondary | ICD-10-CM | POA: Diagnosis not present

## 2017-01-18 DIAGNOSIS — L97512 Non-pressure chronic ulcer of other part of right foot with fat layer exposed: Secondary | ICD-10-CM | POA: Diagnosis not present

## 2017-01-18 DIAGNOSIS — M7751 Other enthesopathy of right foot: Secondary | ICD-10-CM | POA: Diagnosis not present

## 2017-01-22 ENCOUNTER — Ambulatory Visit
Admission: RE | Admit: 2017-01-22 | Discharge: 2017-01-22 | Disposition: A | Discharge status: Home / Self Care | Payer: PPO | Source: Ambulatory Visit | Attending: Podiatry | Admitting: Podiatry

## 2017-01-22 ENCOUNTER — Ambulatory Visit (AMBULATORY_SURGERY_CENTER): Payer: Self-pay | Admitting: *Deleted

## 2017-01-22 VITALS — Ht 71.0 in | Wt 160.4 lb

## 2017-01-22 DIAGNOSIS — L97511 Non-pressure chronic ulcer of other part of right foot limited to breakdown of skin: Secondary | ICD-10-CM | POA: Diagnosis not present

## 2017-01-22 DIAGNOSIS — Z1211 Encounter for screening for malignant neoplasm of colon: Secondary | ICD-10-CM

## 2017-01-22 HISTORY — PX: IR RADIOLOGIST EVAL & MGMT: IMG5224

## 2017-01-22 MED ORDER — METOCLOPRAMIDE HCL 10 MG PO TABS: 10.0000 mg | ORAL_TABLET | Freq: Four times a day (QID) | ORAL | 0 refills | Status: DC

## 2017-01-22 MED ORDER — NA SULFATE-K SULFATE-MG SULF 17.5-3.13-1.6 GM/177ML PO SOLN: ORAL | 0 refills | Status: DC

## 2017-01-22 NOTE — Consult Note (Signed)
Chief Complaint: Right great toe ulcer  Referring Physician(s): McKinney,Benjamin  History of Present Illness: Robert Carey is a 71 y.o. male presenting as a scheduled appointment to Vascular [facility] Interventional Radiology clinic, kindly referred by Dr. Caprice Beaver, for evaluation of a right great toe wound.   Robert Carey and his wife tell me that the wound developed after some "cracking" of the skin occurred the first week of February, which quickly developed into a wound.  He has been receiving wound care from wound care center as well as from Dr. Caprice Beaver.    His cardiovascular risk factors include only his age.   He is a non-smoker, and does not have diabetes.    He does have diagnosis of syndromic conditions, including diagnosis of CREST syndrome, Ankylosing spondylitis, and associated Raynaud's disease.  He tells me the winter months are worst for his Raynauds.    Non-invasive study of the bilateral lower extremity shows ABI within normal limits, with the doppler and PVL showing evidence of right sided tibial disease and small vessel disease of the foot.    Past Medical History:  Diagnosis Date  . Arthritis   . AS (ankylosing spondylitis) ([facility])   . Cataract   . CREST syndrome ([place])   . Dementia   . Diverticulosis of colon (without mention of hemorrhage)   . Esophageal reflux   . Gastroparesis   . Hiatal hernia   . Mixed connective tissue disease ([place])   . Motility disorder, esophageal   . Raynauds phenomenon   . Scleroderma ([place])   . Seizure disorder ([place])   . Seizures ([place])    07/2016  . Skin ulcers of foot, bilateral ([place]) 01/03/2017   R foot only  . Spondylitis, ankylosing ([place])   . Stricture and stenosis of esophagus     Past Surgical History:  Procedure Laterality Date  . NOSE SURGERY  2005  . THUMB ARTHROSCOPY Left   . TOE SURGERY     right great toe  . WISDOM TOOTH EXTRACTION      Allergies: Dilantin [phenytoin sodium extended] and Phenytoin sodium  extended  Medications: Prior to Admission medications   Medication Sig Start Date End Date Taking? Authorizing Provider  acetaminophen (TYLENOL) 500 MG tablet Take 500 mg by mouth every 6 (six) hours as needed.    Historical Provider, MD  aspirin EC 81 MG tablet Take 81 mg by mouth daily.    Historical Provider, MD  Calcium Carbonate-Vitamin D (CALCARB 600/D PO) Take 1 tablet by mouth 2 (two) times daily.     Historical Provider, MD  celecoxib (CELEBREX) 200 MG capsule Take 200 mg by mouth daily.    Historical Provider, MD  clobetasol cream (TEMOVATE) [id number] % Apply 1 application topically 2 (two) times daily.    Historical Provider, MD  ketoconazole (NIZORAL) 2 % cream Apply 1 application topically daily.    Historical Provider, MD  levETIRAcetam (KEPPRA) 250 MG tablet Take 500 mg by mouth every evening. *Takes two 250mg  tablets in addition to two 750mg  tablets for a total of 2000mg  daily in the evening at 7pm. Takes two 750mg  tablets in the morning for a total of 1500mg  daily in the morning*    Historical Provider, MD  levETIRAcetam (KEPPRA) 750 MG tablet Take 1,500-2,000 mg by mouth See admin instructions. Take 1500 mg every morning and 2000mg  at bedtime. *Takes two 250mg  tablets in addition to two 750mg  tablets for a total of 2000mg  daily in the evening at 7pm. Takes  two 750mg  tablets in the morning for a total of 1500mg  daily in the morning*    Historical Provider, MD  metoCLOPramide (REGLAN) 10 MG tablet Take 1 tablet (10 mg total) by mouth 4 (four) times daily. 01/22/17   Irene Shipper, MD  Multiple Vitamin (MULTIVITAMIN) capsule Take 1 capsule by mouth daily.    Historical Provider, MD  Na Sulfate-K Sulfate-Mg Sulf 17.5-3.13-1.6 GM/180ML SOLN No substitutions; use as directed 12/13/16   Irene Shipper, MD  Na Sulfate-K Sulfate-Mg Sulf 17.5-3.13-1.6 GM/180ML SOLN Suprep as directed.  No substitutions. 01/22/17   Irene Shipper, MD  NIFEdipine (PROCARDIA XL/ADALAT-CC) 60 MG 24 hr tablet Take 60 mg by  mouth daily.    Historical Provider, MD  omeprazole (PRILOSEC) 40 MG capsule TAKE (1) CAPSULE BY MOUTH ONCE DAILY. 11/26/16   Irene Shipper, MD  OXcarbazepine (TRILEPTAL) 150 MG tablet Take 150 mg by mouth 2 (two) times daily.    Historical Provider, MD  predniSONE (DELTASONE) 5 MG tablet Take 5 mg by mouth daily.    Historical Provider, MD  silver sulfADIAZINE (SILVADENE) 1 % cream Apply 1 application topically daily.    Historical Provider, MD     Family History  Problem Relation Age of Onset  . Breast cancer Mother   . Pancreatic cancer Mother   . Diabetes Mother   . Heart disease Mother   . Heart disease Father   . COPD Father   . Melanoma Sister   . Stroke Brother   . Colon cancer Neg Hx     Social History   Social History  . Marital status: Married    Spouse name: N/A  . Number of children: 2  . Years of education: N/A   Occupational History  . retired Retired   Social History Main Topics  . Smoking status: Never Smoker  . Smokeless tobacco: Never Used  . Alcohol use No  . Drug use: No  . Sexual activity: Not on file   Other Topics Concern  . Not on file   Social History Narrative  . No narrative on file       Review of Systems: A 12 point ROS discussed and pertinent positives are indicated in the HPI above.  All other systems are negative.  Review of Systems  Vital Signs: BP 133/75 (BP Location: Left Arm, Patient Position: Sitting, Cuff Size: Normal)   Pulse 76   Temp 97.6 F (36.4 C) (Oral)   Resp 14   Ht 5\' 11"  (1.803 m)   Wt 160 lb (72.6 kg)   SpO2 93%   BMI 22.32 kg/m   Physical Exam General: 71 yo appearing  stated age.  Well-developed, well-nourished.  No distress. HEENT: Atraumatic, normocephalic.  Conjugate gaze, extra-ocular motor intact. No scleral icterus or scleral injection. No lesions on external ears, nose, lips, or gums.  Oral mucosa moist, pink.  Neck: Symmetric with no goiter enlargement.  Chest/Lungs:  Symmetric chest with  inspiration/expiration.  No labored breathing.  Clear to auscultation with no wheezes, rhonchi, or rales.  Heart:  RRR, with no third heart sounds appreciated. No JVD appreciated.  Abdomen:  Soft, NT/ND, with + bowel sounds.   Genito-urinary: Deferred Neurologic: Alert & Oriented to person, place, and time.   Normal affect and insight.  Appropriate questions.  Moving all 4 extremities with gross sensory intact.  Pulse Exam:  Non-palpable right PT, right DP.  Doppler positive signal bilateral. Extremities: wound at the base of the right great  toe. Rubor present. No sign of local infection. Purplish discoloration of the toes.  No finger wounds or significant discoloration.    Mallampati Score:     Imaging: US Arterial Seg Multiple  Result Date: 01/11/2017 CLINICAL DATA:  Two right foot first toe plantar ulcers x2 weeks. EXAM: NONINVASIVE PHYSIOLOGIC VASCULAR STUDY OF BILATERAL LOWER EXTREMITIES TECHNIQUE: Evaluation of both lower extremities was performed at rest, including calculation of ankle-brachial indices, multiple segmental pressure evaluation, segmental Doppler and segmental pulse volume recording. COMPARISON:  None. FINDINGS: Right ABI:  1.16 Left ABI:  1.37 Right Lower Extremity: Triphasic arterial waveforms through the popliteal artery, monophasic distally. Left Lower Extremity:  Triphasic waveforms throughout. Pulse volume recording is normal and symmetric through the calf level bilaterally, mildly attenuated at the right ankle and metatarsal, and markedly flattened in all 5 right toes compared to contralateral waveforms. IMPRESSION: 1. No significant lower extremity inflow or outflow disease bilaterally. 2. 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 Electronically Signed   By: Lucrezia Europe M.D.   On: 01/11/2017 12:30   Dg Foot Complete Right  Result Date: 01/16/2017 CLINICAL DATA:  Infected area the plantar aspect of the great toe, evaluate for  osteomyelitis EXAM: RIGHT FOOT COMPLETE - 3+ VIEW COMPARISON:  None. FINDINGS: There does appear to be fusion of the right first DIP joint. However no focal demineralization or erosion is seen to indicate active osteomyelitis. There is some soft tissue swelling of the distal right great toe. Joint spaces appear normal. IMPRESSION: No radiographic evidence of osteomyelitis is seen. There is fusion of the right first DIP joint. Electronically Signed   By: Ivar Drape M.D.   On: 01/16/2017 10:31    Labs:  CBC: No results for input(s): WBC, HGB, HCT, PLT in the last 8760 hours.  COAGS: No results for input(s): INR, APTT in the last 8760 hours.  BMP: No results for input(s): NA, K, CL, CO2, GLUCOSE, BUN, CALCIUM, CREATININE, GFRNONAA, GFRAA in the last 8760 hours.  Invalid input(s): CMP  LIVER FUNCTION TESTS: No results for input(s): BILITOT, AST, ALT, ALKPHOS, PROT, ALBUMIN in the last 8760 hours.  TUMOR MARKERS: No results for input(s): AFPTM, CEA, CA199, CHROMGRNA in the last 8760 hours.  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    Non-invasive lower extremity exam and imaging work-up shows evidence of right tibial disease and small vessel disease.    I had a discussion with Robert Carey and his wife regarding anatomy, pathology/pathophysiology, natural history, and prognosis of PAD/CLI.  We discussed treatment strategies including medical management, surgical strategy, and/or endovascular options, with risk/benefit discussion.    I did discuss the atypical case given his lack of risk factors, but I do suspect that his experiencing a synergistic effect of developing, traditional atherosclerotic changes super-imposed on his baseline Raynaud's disease.   I discussed continuing healthy foot care habits.    I think that aorto-peripheral angiogram and possible  intervention is indicated, given the present of his wound.  I discussed this as an option, and they would like to proceed. Specific risks discussed include: bleeding, infection, contrast reaction, renal injury/nephropathy, arterial injury/dissection, need for additional procedure/surgery, worsening symptoms/tissue including limb loss, cardiopulmonary collapse, death.    Regarding medical management, maximal medical therapy for reduction of risk factors is indicated as recommended by updated AHA guidelines1.  For Robert Carey,  this includes anti-platelet medication, and he would likely benefit from maximum-dose HMG-CoA reductase inhibitor.   Plan: - Angiogram and possible intervention.  Right lower extremity is the target.  - Observe all of his upcoming appointments for wound care/podiatric care.  - Recommend maximal medical therapy for cardiovascular risk reduction, including anti-platelet therapy.  Signed,  Dulcy Fanny. Earleen Newport, DO     1Morley Kos MD, et al. 2016 AHA/ACC Guideline on the Management of Patients With Lower Extremity Peripheral Artery Disease: Executive Summary: A Report of the American College of Cardiology/American Heart Association Task Force on Clinical Practice Guidelines. J Am Coll Cardiol. 2017 Mar 21;69(11):1465-1508. doi: 10.1016/j.jacc.2016.11.008.   2 - Norgren L, et al. TASC II Working Group. Inter-society consensus for the management of peripheral arterial disease. Int Tressia Miners. 2007 Jun;26(2):81-157. Review. PubMed PMID: [id number]  3 - Hingorani A, et al. The management of diabetic foot: A clinical practice guideline by the Society for Vascular Surgery in collaboration with the [place] and the Society  for Vascular Medicine. J Vasc Surg. 2016 Feb;63(2 Suppl):3S-21S. doi: 10.1016/j.jvs.2015.10.003. PubMed PMID: [id number].  Thank you for this interesting consult.  I greatly enjoyed meeting Robert Carey and look forward to participating  in their care.  A copy of this report was sent to the requesting provider on this date.  Electronically Signed: Corrie Mckusick 01/22/2017, 7:11 PM   I spent a total of  40 Minutes   in face to face in clinical consultation, greater than 50% of which was counseling/coordinating care for right great toe wound, critical limb ischemia, possible angiogram with intervention.

## 2017-01-22 NOTE — Telephone Encounter (Signed)
Ok for colon at [facility] per Dr Henrene Pastor. Will proceed as scheduled.

## 2017-01-22 NOTE — Progress Notes (Signed)
Patient refused Emmi, and is not allergic to eggs or soy; no problems with anesthesia.

## 2017-01-23 ENCOUNTER — Encounter (HOSPITAL_BASED_OUTPATIENT_CLINIC_OR_DEPARTMENT_OTHER): Payer: PPO | Attending: Surgery

## 2017-01-23 DIAGNOSIS — L940 Localized scleroderma [morphea]: Secondary | ICD-10-CM | POA: Insufficient documentation

## 2017-01-23 DIAGNOSIS — F0390 Unspecified dementia without behavioral disturbance: Secondary | ICD-10-CM | POA: Diagnosis not present

## 2017-01-23 DIAGNOSIS — I70235 Atherosclerosis of native arteries of right leg with ulceration of other part of foot: Secondary | ICD-10-CM | POA: Diagnosis not present

## 2017-01-23 DIAGNOSIS — L97512 Non-pressure chronic ulcer of other part of right foot with fat layer exposed: Secondary | ICD-10-CM | POA: Diagnosis not present

## 2017-01-29 ENCOUNTER — Other Ambulatory Visit (HOSPITAL_COMMUNITY): Payer: Self-pay | Admitting: Interventional Radiology

## 2017-01-29 DIAGNOSIS — L97511 Non-pressure chronic ulcer of other part of right foot limited to breakdown of skin: Secondary | ICD-10-CM

## 2017-01-30 ENCOUNTER — Encounter: Payer: Self-pay | Admitting: Internal Medicine

## 2017-01-30 DIAGNOSIS — L97512 Non-pressure chronic ulcer of other part of right foot with fat layer exposed: Secondary | ICD-10-CM | POA: Diagnosis not present

## 2017-01-30 DIAGNOSIS — I70235 Atherosclerosis of native arteries of right leg with ulceration of other part of foot: Secondary | ICD-10-CM | POA: Diagnosis not present

## 2017-02-01 DIAGNOSIS — Q828 Other specified congenital malformations of skin: Secondary | ICD-10-CM | POA: Diagnosis not present

## 2017-02-01 DIAGNOSIS — M79672 Pain in left foot: Secondary | ICD-10-CM | POA: Diagnosis not present

## 2017-02-05 DIAGNOSIS — H5213 Myopia, bilateral: Secondary | ICD-10-CM | POA: Diagnosis not present

## 2017-02-05 DIAGNOSIS — H524 Presbyopia: Secondary | ICD-10-CM | POA: Diagnosis not present

## 2017-02-05 DIAGNOSIS — H2513 Age-related nuclear cataract, bilateral: Secondary | ICD-10-CM | POA: Diagnosis not present

## 2017-02-05 DIAGNOSIS — H52203 Unspecified astigmatism, bilateral: Secondary | ICD-10-CM | POA: Diagnosis not present

## 2017-02-06 ENCOUNTER — Other Ambulatory Visit: Payer: Self-pay | Admitting: General Surgery

## 2017-02-06 ENCOUNTER — Other Ambulatory Visit: Payer: Self-pay | Admitting: Radiology

## 2017-02-06 DIAGNOSIS — M79674 Pain in right toe(s): Secondary | ICD-10-CM | POA: Diagnosis not present

## 2017-02-06 DIAGNOSIS — I70235 Atherosclerosis of native arteries of right leg with ulceration of other part of foot: Secondary | ICD-10-CM | POA: Diagnosis not present

## 2017-02-06 DIAGNOSIS — L97512 Non-pressure chronic ulcer of other part of right foot with fat layer exposed: Secondary | ICD-10-CM | POA: Diagnosis not present

## 2017-02-07 ENCOUNTER — Ambulatory Visit (HOSPITAL_COMMUNITY)
Admission: RE | Admit: 2017-02-07 | Discharge: 2017-02-07 | Disposition: A | Discharge status: Home / Self Care | Payer: PPO | Source: Ambulatory Visit | Attending: Interventional Radiology | Admitting: Interventional Radiology

## 2017-02-07 ENCOUNTER — Encounter (HOSPITAL_COMMUNITY): Payer: Self-pay

## 2017-02-07 ENCOUNTER — Other Ambulatory Visit (HOSPITAL_COMMUNITY): Payer: Self-pay | Admitting: Interventional Radiology

## 2017-02-07 DIAGNOSIS — G40909 Epilepsy, unspecified, not intractable, without status epilepticus: Secondary | ICD-10-CM | POA: Insufficient documentation

## 2017-02-07 DIAGNOSIS — K219 Gastro-esophageal reflux disease without esophagitis: Secondary | ICD-10-CM | POA: Diagnosis not present

## 2017-02-07 DIAGNOSIS — M341 CR(E)ST syndrome: Secondary | ICD-10-CM | POA: Insufficient documentation

## 2017-02-07 DIAGNOSIS — I70235 Atherosclerosis of native arteries of right leg with ulceration of other part of foot: Secondary | ICD-10-CM | POA: Diagnosis not present

## 2017-02-07 DIAGNOSIS — L97511 Non-pressure chronic ulcer of other part of right foot limited to breakdown of skin: Secondary | ICD-10-CM

## 2017-02-07 DIAGNOSIS — F0390 Unspecified dementia without behavioral disturbance: Secondary | ICD-10-CM | POA: Insufficient documentation

## 2017-02-07 DIAGNOSIS — Z8249 Family history of ischemic heart disease and other diseases of the circulatory system: Secondary | ICD-10-CM | POA: Diagnosis not present

## 2017-02-07 DIAGNOSIS — K449 Diaphragmatic hernia without obstruction or gangrene: Secondary | ICD-10-CM | POA: Diagnosis not present

## 2017-02-07 DIAGNOSIS — Z7982 Long term (current) use of aspirin: Secondary | ICD-10-CM | POA: Diagnosis not present

## 2017-02-07 DIAGNOSIS — M1990 Unspecified osteoarthritis, unspecified site: Secondary | ICD-10-CM | POA: Insufficient documentation

## 2017-02-07 DIAGNOSIS — Z833 Family history of diabetes mellitus: Secondary | ICD-10-CM | POA: Insufficient documentation

## 2017-02-07 DIAGNOSIS — Z7952 Long term (current) use of systemic steroids: Secondary | ICD-10-CM | POA: Insufficient documentation

## 2017-02-07 DIAGNOSIS — M351 Other overlap syndromes: Secondary | ICD-10-CM | POA: Insufficient documentation

## 2017-02-07 DIAGNOSIS — I7092 Chronic total occlusion of artery of the extremities: Secondary | ICD-10-CM | POA: Insufficient documentation

## 2017-02-07 DIAGNOSIS — K3184 Gastroparesis: Secondary | ICD-10-CM | POA: Diagnosis not present

## 2017-02-07 DIAGNOSIS — I739 Peripheral vascular disease, unspecified: Secondary | ICD-10-CM | POA: Diagnosis not present

## 2017-02-07 DIAGNOSIS — I7300 Raynaud's syndrome without gangrene: Secondary | ICD-10-CM | POA: Insufficient documentation

## 2017-02-07 DIAGNOSIS — L97519 Non-pressure chronic ulcer of other part of right foot with unspecified severity: Secondary | ICD-10-CM | POA: Diagnosis not present

## 2017-02-07 HISTORY — PX: IR GENERIC HISTORICAL: IMG1180011

## 2017-02-07 LAB — PROTIME-INR
INR: 1.1
Prothrombin Time: 14.2 seconds (ref 11.4–15.2)

## 2017-02-07 LAB — CBC
HEMATOCRIT: 42.4 % (ref 39.0–52.0)
HEMOGLOBIN: 13.8 g/dL (ref 13.0–17.0)
MCH: 29 pg (ref 26.0–34.0)
MCHC: 32.5 g/dL (ref 30.0–36.0)
MCV: 89.1 fL (ref 78.0–100.0)
Platelets: 221 10*3/uL (ref 150–400)
RBC: 4.76 MIL/uL (ref 4.22–5.81)
RDW: 14.4 % (ref 11.5–15.5)
WBC: 9.3 10*3/uL (ref 4.0–10.5)

## 2017-02-07 LAB — BASIC METABOLIC PANEL
Anion gap: 9 (ref 5–15)
BUN: 27 mg/dL — ABNORMAL HIGH (ref 6–20)
CHLORIDE: 105 mmol/L (ref 101–111)
CO2: 26 mmol/L (ref 22–32)
Calcium: 9.5 mg/dL (ref 8.9–10.3)
Creatinine, Ser: 1.07 mg/dL (ref 0.61–1.24)
GFR calc Af Amer: >60 mL/min (ref >60)
GLUCOSE: 89 mg/dL (ref 65–99)
POTASSIUM: 4.4 mmol/L (ref 3.5–5.1)
Sodium: 140 mmol/L (ref 135–145)

## 2017-02-07 LAB — POCT ACTIVATED CLOTTING TIME
ACTIVATED CLOTTING TIME: 164 s
Activated Clotting Time: 186 seconds

## 2017-02-07 LAB — APTT: aPTT: 35 seconds (ref 24–36)

## 2017-02-07 MED ORDER — ALTEPLASE 2 MG IJ SOLR
INTRAMUSCULAR | Status: AC
  Filled 2017-02-07: qty 2

## 2017-02-07 MED ORDER — SODIUM CHLORIDE 0.9 % IV SOLN: INTRAVENOUS | Status: AC

## 2017-02-07 MED ORDER — IODIXANOL 320 MG/ML IV SOLN
100.0000 mL | Freq: Once | INTRAVENOUS | Status: AC | PRN
  Administered 2017-02-07: 50 mL via INTRA_ARTERIAL

## 2017-02-07 MED ORDER — FENTANYL CITRATE (PF) 100 MCG/2ML IJ SOLN
INTRAMUSCULAR | Status: AC | PRN
  Administered 2017-02-07: 50 ug via INTRAVENOUS
  Administered 2017-02-07 (×2): 25 ug via INTRAVENOUS

## 2017-02-07 MED ORDER — MIDAZOLAM HCL 2 MG/2ML IJ SOLN
INTRAMUSCULAR | Status: AC | PRN
  Administered 2017-02-07: 0.5 mg via INTRAVENOUS
  Administered 2017-02-07: 1 mg via INTRAVENOUS
  Administered 2017-02-07: 0.5 mg via INTRAVENOUS

## 2017-02-07 MED ORDER — ALTEPLASE 2 MG IJ SOLR: 8.0000 mg | Freq: Once | INTRAMUSCULAR | Status: DC

## 2017-02-07 MED ORDER — ASPIRIN 81 MG PO CHEW
162.0000 mg | CHEWABLE_TABLET | Freq: Once | ORAL | Status: AC
  Administered 2017-02-07: 162 mg via ORAL

## 2017-02-07 MED ORDER — CLOPIDOGREL BISULFATE 75 MG PO TABS
300.0000 mg | ORAL_TABLET | Freq: Once | ORAL | Status: AC
  Administered 2017-02-07: 300 mg via ORAL

## 2017-02-07 MED ORDER — CLOPIDOGREL BISULFATE 75 MG PO TABS: 75.0000 mg | ORAL_TABLET | Freq: Every day | ORAL | 2 refills | Status: AC

## 2017-02-07 MED ORDER — HEPARIN SODIUM (PORCINE) 1000 UNIT/ML IJ SOLN
6000.0000 [IU] | Freq: Once | INTRAMUSCULAR | Status: AC
  Administered 2017-02-07: 6000 [IU] via INTRAVENOUS

## 2017-02-07 MED ORDER — FENTANYL CITRATE (PF) 100 MCG/2ML IJ SOLN
INTRAMUSCULAR | Status: AC
  Filled 2017-02-07: qty 2

## 2017-02-07 MED ORDER — ALTEPLASE 2 MG IJ SOLR
4.0000 mg | Freq: Once | INTRAMUSCULAR | Status: AC
  Administered 2017-02-07: 2 mg
  Administered 2017-02-07: 1 mg
  Administered 2017-02-07: 4 mg
  Administered 2017-02-07: 1 mg

## 2017-02-07 MED ORDER — LIDOCAINE HCL (PF) 1 % IJ SOLN
INTRAMUSCULAR | Status: AC
  Administered 2017-02-07: 30 mL
  Filled 2017-02-07: qty 30

## 2017-02-07 MED ORDER — HEPARIN SODIUM (PORCINE) 1000 UNIT/ML IJ SOLN
INTRAMUSCULAR | Status: AC
  Filled 2017-02-07: qty 1

## 2017-02-07 MED ORDER — ALTEPLASE 2 MG IJ SOLR
INTRAMUSCULAR | Status: DC
Start: 2017-02-07 — End: 2017-02-08
  Filled 2017-02-07: qty 2

## 2017-02-07 MED ORDER — NITROGLYCERIN 1 MG/10 ML FOR IR/CATH LAB
300.0000 ug | Freq: Once | INTRA_ARTERIAL | Status: AC
  Administered 2017-02-07 (×2): 300 ug via INTRA_ARTERIAL

## 2017-02-07 MED ORDER — HEPARIN SODIUM (PORCINE) 1000 UNIT/ML IJ SOLN
2000.0000 [IU] | Freq: Once | INTRAMUSCULAR | Status: AC
  Administered 2017-02-07: 2000 [IU] via INTRAVENOUS

## 2017-02-07 MED ORDER — MIDAZOLAM HCL 2 MG/2ML IJ SOLN
INTRAMUSCULAR | Status: AC
  Filled 2017-02-07: qty 2

## 2017-02-07 MED ORDER — SODIUM CHLORIDE 0.9 % IV SOLN: INTRAVENOUS | Status: DC

## 2017-02-07 MED ORDER — ASPIRIN 81 MG PO CHEW
CHEWABLE_TABLET | ORAL | Status: DC
Start: 2017-02-07 — End: 2017-02-08
  Filled 2017-02-07: qty 1

## 2017-02-07 MED ORDER — CLOPIDOGREL BISULFATE 75 MG PO TABS
ORAL_TABLET | ORAL | Status: AC
  Filled 2017-02-07: qty 4

## 2017-02-07 MED ORDER — ALTEPLASE 2 MG IJ SOLR
4.0000 mg | Freq: Once | INTRAMUSCULAR | Status: DC
  Filled 2017-02-07: qty 4

## 2017-02-07 MED ORDER — NITROGLYCERIN 1 MG/10 ML FOR IR/CATH LAB
INTRA_ARTERIAL | Status: AC
  Filled 2017-02-07: qty 10

## 2017-02-07 NOTE — Sedation Documentation (Signed)
Patient is resting comfortably. 

## 2017-02-07 NOTE — Sedation Documentation (Signed)
Transport to short stay 13, groin and pulses reviewed with Denice Paradise, RN. SS.  Pt AxO tolerated well.

## 2017-02-07 NOTE — Sedation Documentation (Signed)
Act 164

## 2017-02-07 NOTE — Sedation Documentation (Signed)
Pulse on rt foot dopple PT and DP, on left foot dopple on PT but unable to dopple DP.

## 2017-02-07 NOTE — H&P (Signed)
Chief Complaint: Patient was seen in consultation today for Aortagram with bifemoral runoff with possible intervrntion at the request of Dr Brita Romp  Referring Physician(s): Dr Brita Romp  Supervising Physician: Corrie Mckusick  Patient Status: [facility] - Out-pt  History of Present Illness: Robert Carey is a 71 y.o. 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   Non-invasive lower extremity exam and imaging work-up shows evidence of right tibial disease and small vessel disease.   I had a discussion with Robert Carey and his wife regarding anatomy, pathology/pathophysiology, natural history, and prognosis of PAD/CLI.  We discussed treatment strategies including medical management, surgical strategy, and/or endovascular options, with risk/benefit discussion.   I did discuss the atypical case given his lack of risk factors, but I do suspect that his experiencing a synergistic effect of developing, traditional atherosclerotic changes super-imposed on his baseline Raynaud's disease. I discussed continuing healthy foot care habits.   I think that aorto-peripheral angiogram and possible intervention is indicated, given the present of his wound.  I discussed this as an option, and they would like to proceed. Specific risks discussed include: bleeding, infection, contrast reaction, renal injury/nephropathy, arterial injury/dissection, need for additional procedure/surgery, worsening symptoms/tissue including limb loss, cardiopulmonary collapse, death.    Now scheduled for Aortagram with Bifemoral runoff with possible intervention   Past Medical History:  Diagnosis Date  . Arthritis   . AS (ankylosing spondylitis) (HCC)   . Cataract   . CREST syndrome ([place])   . Dementia   . Diverticulosis of colon (without mention of hemorrhage)   . Esophageal reflux     . Gastroparesis   . Hiatal hernia   . Mixed connective tissue disease ([place])   . Motility disorder, esophageal   . Raynauds phenomenon   . Scleroderma ([place])   . Seizure disorder ([place])   . Seizures ([place])    07/2016  . Skin ulcers of foot, bilateral ([place]) 01/03/2017   R foot only  . Spondylitis, ankylosing ([place])   . Stricture and stenosis of esophagus     Past Surgical History:  Procedure Laterality Date  . NOSE SURGERY  2005  . THUMB ARTHROSCOPY Left   . TOE SURGERY     right great toe  . WISDOM TOOTH EXTRACTION      Allergies: Dilantin [phenytoin sodium extended] and Phenytoin sodium extended  Medications: Prior to Admission medications   Medication Sig Start Date End Date Taking? Authorizing Provider  acetaminophen (TYLENOL) 500 MG tablet Take 500 mg by mouth every 6 (six) hours as needed.   Yes Historical Provider, MD  aspirin EC 81 MG tablet Take 81 mg by mouth daily.   Yes Historical Provider, MD  Calcium Carbonate-Vitamin D (CALCARB 600/D PO) Take 1 tablet by mouth 2 (two) times daily.    Yes Historical Provider, MD  celecoxib (CELEBREX) 200 MG capsule Take 200 mg by mouth daily.   Yes Historical Provider, MD  clobetasol cream (TEMOVATE) 4.70 % Apply 1 application topically 2 (two) times daily as needed.    Yes Historical Provider, MD  ketoconazole (NIZORAL) 2 % cream Apply 1 application topically daily as needed for irritation.    Yes Historical Provider, MD  levETIRAcetam (KEPPRA) 250 MG tablet Take 375-500 mg by mouth See admin instructions. Takes two 750mg  tablets in the morning for a total of 1500mg  daily  in the morning, 375 mg at noon, Takes two 250mg  tablets (500 mg) daily in the evening at 7pm, and 750 mg at 9p   Yes Historical Provider, MD  levETIRAcetam (KEPPRA) 750 MG tablet Take 750-1,500 mg by mouth See admin instructions. Takes two 750mg  tablets in the morning for a total of 1500mg  daily in the morning, 375 mg at noon, Takes two 250mg  tablets (500 mg) daily in  the evening at 7pm, and 750 mg at 9p   Yes Historical Provider, MD  Multiple Vitamin (MULTIVITAMIN) capsule Take 1 capsule by mouth daily.   Yes Historical Provider, MD  NIFEdipine (PROCARDIA XL/ADALAT-CC) 60 MG 24 hr tablet Take 60 mg by mouth every evening.    Yes Historical Provider, MD  NONFORMULARY OR COMPOUNDED ITEM Apply 1 application topically 2 (two) times daily as needed. Apply to neck twice daily as needed. Meloxicam, lidocaine, topiramate, prilocaine   Yes Historical Provider, MD  NONFORMULARY OR COMPOUNDED ITEM Apply 1 application topically 4 (four) times daily as needed. Apply to right foot as needed. Ketoconazole, diclofenac, baclofen, lidocaine, gabapentin   Yes Historical Provider, MD  omeprazole (PRILOSEC) 40 MG capsule TAKE (1) CAPSULE BY MOUTH ONCE DAILY. 11/26/16  Yes Irene Shipper, MD  OXcarbazepine (TRILEPTAL) 150 MG tablet Take 150-300 mg by mouth See admin instructions. 150 mg in the morning, 300 mh in the evening   Yes Historical Provider, MD  predniSONE (DELTASONE) 5 MG tablet Take 5 mg by mouth daily.   Yes Historical Provider, MD  silver sulfADIAZINE (SILVADENE) 1 % cream Apply 1 application topically daily as needed.     Historical Provider, MD     Family History  Problem Relation Age of Onset  . Breast cancer Mother   . Pancreatic cancer Mother   . Diabetes Mother   . Heart disease Mother   . Heart disease Father   . COPD Father   . Melanoma Sister   . Stroke Brother   . Colon cancer Neg Hx     Social History   Social History  . Marital status: Married    Spouse name: N/A  . Number of children: 2  . Years of education: N/A   Occupational History  . retired Retired   Social History Main Topics  . Smoking status: Never Smoker  . Smokeless tobacco: Never Used  . Alcohol use No  . Drug use: No  . Sexual activity: Not Asked   Other Topics Concern  . None   Social History Narrative  . None    Review of Systems: A 12 point ROS discussed and  pertinent positives are indicated in the HPI above.  All other systems are negative.  Review of Systems  Constitutional: Positive for activity change. Negative for appetite change, fatigue and fever.  Eyes: Negative for visual disturbance.  Respiratory: Negative for shortness of breath.   Cardiovascular: Negative for chest pain.  Gastrointestinal: Negative for abdominal pain.  Genitourinary: Negative for difficulty urinating.  Musculoskeletal: Positive for gait problem. Negative for back pain.  Neurological: Positive for seizures and weakness. Negative for headaches.  Psychiatric/Behavioral: Negative for behavioral problems and confusion.    Vital Signs: BP (!) 152/77   Pulse 72   Temp 98.5 F (36.9 C)   Resp 18   Ht 5\' 11"  (1.803 m)   Wt 160 lb (72.6 kg)   SpO2 100%   BMI 22.32 kg/m   Physical Exam  Constitutional: He is oriented to person, place, and time.  Cardiovascular:  Normal rate, regular rhythm and normal heart sounds.   Pulmonary/Chest: Effort normal and breath sounds normal. He has no wheezes.  Abdominal: Soft. Bowel sounds are normal. There is no tenderness.  Musculoskeletal: Normal range of motion. He exhibits tenderness.  Rt 1st toe wound  Neurological: He is alert and oriented to person, place, and time.  Skin: Skin is warm and dry.  Rt toe wound  Psychiatric: He has a normal mood and affect. His behavior is normal. Judgment and thought content normal.  Nursing note and vitals reviewed.   Mallampati Score:  MD Evaluation Airway: WNL Heart: WNL Abdomen: WNL Chest/ Lungs: WNL ASA  Classification: 3 Mallampati/Airway Score: One  Imaging: US Arterial Seg Multiple  Result Date: 01/11/2017 CLINICAL DATA:  Two right foot first toe plantar ulcers x2 weeks. EXAM: NONINVASIVE PHYSIOLOGIC VASCULAR STUDY OF BILATERAL LOWER EXTREMITIES TECHNIQUE: Evaluation of both lower extremities was performed at rest, including calculation of ankle-brachial indices, multiple  segmental pressure evaluation, segmental Doppler and segmental pulse volume recording. COMPARISON:  None. FINDINGS: Right ABI:  1.16 Left ABI:  1.37 Right Lower Extremity: Triphasic arterial waveforms through the popliteal artery, monophasic distally. Left Lower Extremity:  Triphasic waveforms throughout. Pulse volume recording is normal and symmetric through the calf level bilaterally, mildly attenuated at the right ankle and metatarsal, and markedly flattened in all 5 right toes compared to contralateral waveforms. IMPRESSION: 1. No significant lower extremity inflow or outflow disease bilaterally. 2. 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 Electronically Signed   By: Lucrezia Europe M.D.   On: 01/11/2017 12:30   Dg Foot Complete Right  Result Date: 01/16/2017 CLINICAL DATA:  Infected area the plantar aspect of the great toe, evaluate for osteomyelitis EXAM: RIGHT FOOT COMPLETE - 3+ VIEW COMPARISON:  None. FINDINGS: There does appear to be fusion of the right first DIP joint. However no focal demineralization or erosion is seen to indicate active osteomyelitis. There is some soft tissue swelling of the distal right great toe. Joint spaces appear normal. IMPRESSION: No radiographic evidence of osteomyelitis is seen. There is fusion of the right first DIP joint. Electronically Signed   By: Ivar Drape M.D.   On: 01/16/2017 10:31    Labs:  CBC:  Recent Labs  02/07/17 1043  WBC 9.3  HGB 13.8  HCT 42.4  PLT 221    COAGS:  Recent Labs  02/07/17 1043  INR 1.10  APTT 35    BMP:  Recent Labs  02/07/17 1043  NA 140  K 4.4  CL 105  CO2 26  GLUCOSE 89  BUN 27*  CALCIUM 9.5  CREATININE 1.07  GFRNONAA >60  GFRAA >60    LIVER FUNCTION TESTS: No results for input(s): BILITOT, AST, ALT, ALKPHOS, PROT, ALBUMIN in the last 8760 hours.  TUMOR MARKERS: No results for input(s): AFPTM, CEA, CA199, CHROMGRNA in the last 8760 hours.  Assessment  and Plan:  Rt great toe nonhealing wound Scheduled for aortogram with bifem runoff and possible intervention Risks and Benefits discussed with the patient including, but not limited to bleeding, infection, vascular injury or contrast induced renal failure. All of the patient's questions were answered, patient is agreeable to proceed. Consent signed and in chart.  Thank you for this interesting consult.  I greatly enjoyed meeting Robert Carey and look forward to participating in their care.  A copy of this report was sent to the requesting provider on this date.  Electronically Signed: Lavonia Drafts 02/07/2017,  11:46 AM   I spent a total of  30 Minutes   in face to face in clinical consultation, greater than 50% of which was counseling/coordinating care for bifem runoff and poss intervention

## 2017-02-07 NOTE — Discharge Instructions (Signed)
Angiogram, Care After °This sheet gives you information about how to care for yourself after your procedure. Your health care provider may also give you more specific instructions. If you have problems or questions, contact your health care provider. °What can I expect after the procedure? °After the procedure, it is common to have bruising and tenderness at the catheter insertion area. °Follow these instructions at home: °Insertion site care  °· Follow instructions from your health care provider about how to take care of your insertion site. Make sure you: °¨ Wash your hands with soap and water before you change your bandage (dressing). If soap and water are not available, use hand sanitizer. °¨ Change your dressing as told by your health care provider. °¨ Leave stitches (sutures), skin glue, or adhesive strips in place. These skin closures may need to stay in place for 2 weeks or longer. If adhesive strip edges start to loosen and curl up, you may trim the loose edges. Do not remove adhesive strips completely unless your health care provider tells you to do that. °· Do not take baths, swim, or use a hot tub until your health care provider approves. °· You may shower 24-48 hours after the procedure or as told by your health care provider. °¨ Gently wash the site with plain soap and water. °¨ Pat the area dry with a clean towel. °¨ Do not rub the site. This may cause bleeding. °· Do not apply powder or lotion to the site. Keep the site clean and dry. °· Check your insertion site every day for signs of infection. Check for: °¨ Redness, swelling, or pain. °¨ Fluid or blood. °¨ Warmth. °¨ Pus or a bad smell. °Activity  °· Rest as told by your health care provider, usually for 1-2 days. °· Do not lift anything that is heavier than 10 lbs. (4.5 kg) or as told by your health care provider. °· Do not drive for 24 hours if you were given a medicine to help you relax (sedative). °· Do not drive or use heavy machinery while  taking prescription pain medicine. °General instructions  °· Return to your normal activities as told by your health care provider, usually in about a week. Ask your health care provider what activities are safe for you. °· If the catheter site starts bleeding, lie flat and put pressure on the site. If the bleeding does not stop, get help right away. This is a medical emergency. °· Drink enough fluid to keep your urine clear or pale yellow. This helps flush the contrast dye from your body. °· Take over-the-counter and prescription medicines only as told by your health care provider. °· Keep all follow-up visits as told by your health care provider. This is important. °Contact a health care provider if: °· You have a fever or chills. °· You have redness, swelling, or pain around your insertion site. °· You have fluid or blood coming from your insertion site. °· The insertion site feels warm to the touch. °· You have pus or a bad smell coming from your insertion site. °· You have bruising around the insertion site. °· You notice blood collecting in the tissue around the catheter site (hematoma). The hematoma may be painful to the touch. °Get help right away if: °· You have severe pain at the catheter insertion area. °· The catheter insertion area swells very fast. °· The catheter insertion area is bleeding, and the bleeding does not stop when you hold steady pressure on   the area. °· The area near or just beyond the catheter insertion site becomes pale, cool, tingly, or numb. °These symptoms may represent a serious problem that is an emergency. Do not wait to see if the symptoms will go away. Get medical help right away. Call your local emergency services (911 in the [place] Do not drive yourself to the hospital. °Summary °· After the procedure, it is common to have bruising and tenderness at the catheter insertion area. °· After the procedure, it is important to rest and drink plenty of fluids. °· Do not take baths,  swim, or use a hot tub until your health care provider says it is okay to do so. You may shower 24-48 hours after the procedure or as told by your health care provider. °· If the catheter site starts bleeding, lie flat and put pressure on the site. If the bleeding does not stop, get help right away. This is a medical emergency. °This information is not intended to replace advice given to you by your health care provider. Make sure you discuss any questions you have with your health care provider. °Document Released: 05/24/2005 Document Revised: 10/10/2016 Document Reviewed: 10/10/2016 °Elsevier Interactive Patient Education © 2017 [employer] ° °

## 2017-02-07 NOTE — Procedures (Addendum)
Interventional Radiology Procedure Note  Procedure: US guided left CFA access.  Angiogram right lower extremity  Revascularization of PT CTO, with balloon angioplasty to 56mm diameter.    PTA of anterior tibial artery.   8mg  IA tPA 519mcg nitro IA  8000U IV heparin  Complications: None  EBL: minimal  Recommendations:  - 4 hours left hip straight - restart home meds, including keppra tonight - band-aid to site unless oozing - advance diet - initiate plavix 75mg  PO daily, with 81mg  ASA daily - Do not submerge for 7 days - Routine wound care  - Follow up with Dr. Earleen Newport at [facility] in ~ 4 weeks.   Signed,  Dulcy Fanny. Earleen Newport, DO

## 2017-02-11 ENCOUNTER — Other Ambulatory Visit (HOSPITAL_COMMUNITY): Payer: Self-pay | Admitting: Interventional Radiology

## 2017-02-11 ENCOUNTER — Encounter (HOSPITAL_COMMUNITY): Payer: Self-pay | Admitting: Interventional Radiology

## 2017-02-11 ENCOUNTER — Other Ambulatory Visit: Payer: Self-pay | Admitting: Interventional Radiology

## 2017-02-11 DIAGNOSIS — L97511 Non-pressure chronic ulcer of other part of right foot limited to breakdown of skin: Secondary | ICD-10-CM

## 2017-02-12 DIAGNOSIS — I739 Peripheral vascular disease, unspecified: Secondary | ICD-10-CM | POA: Diagnosis not present

## 2017-02-12 DIAGNOSIS — Q828 Other specified congenital malformations of skin: Secondary | ICD-10-CM | POA: Diagnosis not present

## 2017-02-12 DIAGNOSIS — M79671 Pain in right foot: Secondary | ICD-10-CM | POA: Diagnosis not present

## 2017-02-12 DIAGNOSIS — L97512 Non-pressure chronic ulcer of other part of right foot with fat layer exposed: Secondary | ICD-10-CM | POA: Diagnosis not present

## 2017-02-12 DIAGNOSIS — I70235 Atherosclerosis of native arteries of right leg with ulceration of other part of foot: Secondary | ICD-10-CM | POA: Diagnosis not present

## 2017-02-13 ENCOUNTER — Encounter: Payer: PPO | Admitting: Internal Medicine

## 2017-02-14 DIAGNOSIS — I7300 Raynaud's syndrome without gangrene: Secondary | ICD-10-CM | POA: Diagnosis not present

## 2017-02-14 DIAGNOSIS — L97509 Non-pressure chronic ulcer of other part of unspecified foot with unspecified severity: Secondary | ICD-10-CM | POA: Diagnosis not present

## 2017-02-14 DIAGNOSIS — I739 Peripheral vascular disease, unspecified: Secondary | ICD-10-CM | POA: Diagnosis not present

## 2017-02-19 DIAGNOSIS — I739 Peripheral vascular disease, unspecified: Secondary | ICD-10-CM | POA: Diagnosis not present

## 2017-02-19 DIAGNOSIS — Q828 Other specified congenital malformations of skin: Secondary | ICD-10-CM | POA: Diagnosis not present

## 2017-02-19 DIAGNOSIS — M79671 Pain in right foot: Secondary | ICD-10-CM | POA: Diagnosis not present

## 2017-02-19 DIAGNOSIS — L97512 Non-pressure chronic ulcer of other part of right foot with fat layer exposed: Secondary | ICD-10-CM | POA: Diagnosis not present

## 2017-02-20 ENCOUNTER — Encounter (HOSPITAL_BASED_OUTPATIENT_CLINIC_OR_DEPARTMENT_OTHER): Payer: PPO | Attending: Surgery

## 2017-02-20 DIAGNOSIS — G40909 Epilepsy, unspecified, not intractable, without status epilepticus: Secondary | ICD-10-CM | POA: Insufficient documentation

## 2017-02-20 DIAGNOSIS — F0390 Unspecified dementia without behavioral disturbance: Secondary | ICD-10-CM | POA: Insufficient documentation

## 2017-02-20 DIAGNOSIS — L97512 Non-pressure chronic ulcer of other part of right foot with fat layer exposed: Secondary | ICD-10-CM | POA: Insufficient documentation

## 2017-02-20 DIAGNOSIS — L940 Localized scleroderma [morphea]: Secondary | ICD-10-CM | POA: Diagnosis not present

## 2017-02-20 DIAGNOSIS — I7301 Raynaud's syndrome with gangrene: Secondary | ICD-10-CM | POA: Diagnosis not present

## 2017-02-20 DIAGNOSIS — I70235 Atherosclerosis of native arteries of right leg with ulceration of other part of foot: Secondary | ICD-10-CM | POA: Diagnosis not present

## 2017-02-22 DIAGNOSIS — L97512 Non-pressure chronic ulcer of other part of right foot with fat layer exposed: Secondary | ICD-10-CM | POA: Diagnosis not present

## 2017-02-22 DIAGNOSIS — M7751 Other enthesopathy of right foot: Secondary | ICD-10-CM | POA: Diagnosis not present

## 2017-02-22 DIAGNOSIS — Z7982 Long term (current) use of aspirin: Secondary | ICD-10-CM | POA: Diagnosis not present

## 2017-02-22 DIAGNOSIS — M0609 Rheumatoid arthritis without rheumatoid factor, multiple sites: Secondary | ICD-10-CM | POA: Diagnosis not present

## 2017-02-22 DIAGNOSIS — G40909 Epilepsy, unspecified, not intractable, without status epilepticus: Secondary | ICD-10-CM | POA: Diagnosis not present

## 2017-02-22 DIAGNOSIS — I739 Peripheral vascular disease, unspecified: Secondary | ICD-10-CM | POA: Diagnosis not present

## 2017-02-26 DIAGNOSIS — R569 Unspecified convulsions: Secondary | ICD-10-CM | POA: Diagnosis not present

## 2017-02-26 DIAGNOSIS — Z79899 Other long term (current) drug therapy: Secondary | ICD-10-CM | POA: Diagnosis not present

## 2017-02-26 DIAGNOSIS — G40209 Localization-related (focal) (partial) symptomatic epilepsy and epileptic syndromes with complex partial seizures, not intractable, without status epilepticus: Secondary | ICD-10-CM | POA: Diagnosis not present

## 2017-02-26 DIAGNOSIS — B941 Sequelae of viral encephalitis: Secondary | ICD-10-CM | POA: Diagnosis not present

## 2017-02-27 ENCOUNTER — Ambulatory Visit
Admission: RE | Admit: 2017-02-27 | Discharge: 2017-02-27 | Disposition: A | Discharge status: Home / Self Care | Payer: PPO | Source: Ambulatory Visit | Attending: Interventional Radiology | Admitting: Interventional Radiology

## 2017-02-27 DIAGNOSIS — L97511 Non-pressure chronic ulcer of other part of right foot limited to breakdown of skin: Secondary | ICD-10-CM | POA: Diagnosis not present

## 2017-02-27 DIAGNOSIS — L97512 Non-pressure chronic ulcer of other part of right foot with fat layer exposed: Secondary | ICD-10-CM | POA: Diagnosis not present

## 2017-02-27 DIAGNOSIS — I70235 Atherosclerosis of native arteries of right leg with ulceration of other part of foot: Secondary | ICD-10-CM | POA: Diagnosis not present

## 2017-02-27 HISTORY — PX: IR RADIOLOGIST EVAL & MGMT: IMG5224

## 2017-02-27 NOTE — Progress Notes (Signed)
Chief Complaint: Right great toe wound  Referring Physician(s): Dr. Caprice Beaver  History of Present Illness: Robert Carey is a 71 y.o. male presenting as a scheduled follow-up to vascular interventional radiology today, status post angiogram and treatment of right lower extremity performed 02/07/2017.  He was discharged home same day, and has recovered well.  The procedure was performed for tibial disease likely contributing to slow wound healing of what appears to be an ulcer of mixed etiology on the plantar aspect of the metatarsal head.  He is here with his wife, who provides the majority of the history given his difficulty with short-term memory/recall.  They tell me that he did very well after the procedure though did have some bruising at the left common femoral artery access site. This has since resolved comfortably. He has no concerns for any bulges or lumps in the area at this point. He continues to receive treatment by both Dr. Caprice Beaver and the wound care center at [facility] with surgical debridement episodically. They tell me that his surgeon seems quite pleased with bleeding response during debridement.  I am told that his discomfort at rest has significantly improved, with significantly less discomfort at nighttime keeping him awake. They both seem quite satisfied with the result after revascularization of the posterior tibial artery.  Past Medical History:  Diagnosis Date  . Arthritis   . AS (ankylosing spondylitis) (HCC)   . Cataract   . CREST syndrome ([place])   . Dementia   . Diverticulosis of colon (without mention of hemorrhage)   . Esophageal reflux   . Gastroparesis   . Hiatal hernia   . Mixed connective tissue disease ([place])   . Motility disorder, esophageal   . Raynauds phenomenon   . Scleroderma ([place])   . Seizure disorder ([place])   . Seizures ([place])    07/2016  . Skin ulcers of foot, bilateral ([place]) 01/03/2017   R foot only  . Spondylitis,  ankylosing ([place])   . Stricture and stenosis of esophagus     Past Surgical History:  Procedure Laterality Date  . IR GENERIC HISTORICAL  02/07/2017   IR TIB-PERO ART UNI PTA EA ADD VESSEL MOD SED 02/07/2017 Corrie Mckusick, DO [facility]  . IR GENERIC HISTORICAL  02/07/2017   IR US GUIDE VASC ACCESS LEFT 02/07/2017 Corrie Mckusick, DO [facility]  . IR GENERIC HISTORICAL  02/07/2017   IR TIB-PERO ART PTA MOD SED 02/07/2017 Corrie Mckusick, DO [facility]  . IR GENERIC HISTORICAL  02/07/2017   IR ANGIOGRAM SELECTIVE EACH ADDITIONAL VESSEL 02/07/2017 Corrie Mckusick, DO [facility]  . IR GENERIC HISTORICAL  02/07/2017   IR ANGIOGRAM EXTREMITY RIGHT 02/07/2017 Corrie Mckusick, DO [facility]  . IR GENERIC HISTORICAL  02/07/2017   IR THROMBECT PRIM MECH INIT (INCLU) MOD SED 02/07/2017 Corrie Mckusick, DO [facility]  . NOSE SURGERY  2005  . THUMB ARTHROSCOPY Left   . TOE SURGERY     right great toe  . WISDOM TOOTH EXTRACTION      Allergies: Dilantin [phenytoin sodium extended] and Phenytoin sodium extended  Medications: Prior to Admission medications   Medication Sig Start Date End Date Taking? Authorizing Provider  vitamin A 10000 UNIT capsule Take 10,000 Units by mouth daily.   Yes Historical Provider, MD  acetaminophen (TYLENOL) 500 MG tablet Take 500 mg by mouth every 6 (six) hours as needed.    Historical Provider, MD  aspirin EC 81 MG tablet Take 81 mg by mouth  daily.    Historical Provider, MD  Calcium Carbonate-Vitamin D (CALCARB 600/D PO) Take 1 tablet by mouth 2 (two) times daily.     Historical Provider, MD  celecoxib (CELEBREX) 200 MG capsule Take 200 mg by mouth daily.    Historical Provider, MD  clobetasol cream (TEMOVATE) 4.23 % Apply 1 application topically 2 (two) times daily as needed.     Historical Provider, MD  clopidogrel (PLAVIX) 75 MG tablet Take 1 tablet (75 mg total) by mouth daily. 02/07/17 05/08/17  Corrie Mckusick, DO  ketoconazole (NIZORAL) 2 % cream Apply 1 application  topically daily as needed for irritation.     Historical Provider, MD  levETIRAcetam (KEPPRA) 250 MG tablet Take 375-500 mg by mouth See admin instructions. Takes two 750mg  tablets in the morning for a total of 1500mg  daily in the morning, 375 mg at noon, Takes two 250mg  tablets (500 mg) daily in the evening at 7pm, and 750 mg at 9p    Historical Provider, MD  levETIRAcetam (KEPPRA) 750 MG tablet Take 750-1,500 mg by mouth See admin instructions. Takes two 750mg  tablets in the morning for a total of 1500mg  daily in the morning, 375 mg at noon, Takes two 250mg  tablets (500 mg) daily in the evening at 7pm, and 750 mg at 9p    Historical Provider, MD  Multiple Vitamin (MULTIVITAMIN) capsule Take 1 capsule by mouth daily.    Historical Provider, MD  NIFEdipine (PROCARDIA XL/ADALAT-CC) 60 MG 24 hr tablet Take 60 mg by mouth every evening.     Historical Provider, MD  NONFORMULARY OR COMPOUNDED ITEM Apply 1 application topically 2 (two) times daily as needed. Apply to neck twice daily as needed. Meloxicam, lidocaine, topiramate, prilocaine    Historical Provider, MD  NONFORMULARY OR COMPOUNDED ITEM Apply 1 application topically 4 (four) times daily as needed. Apply to right foot as needed. Ketoconazole, diclofenac, baclofen, lidocaine, gabapentin    Historical Provider, MD  omeprazole (PRILOSEC) 40 MG capsule TAKE (1) CAPSULE BY MOUTH ONCE DAILY. 11/26/16   Irene Shipper, MD  OXcarbazepine (TRILEPTAL) 150 MG tablet Take 150-300 mg by mouth See admin instructions. 150 mg in the morning, 300 mh in the evening    Historical Provider, MD  predniSONE (DELTASONE) 5 MG tablet Take 5 mg by mouth daily.    Historical Provider, MD  silver sulfADIAZINE (SILVADENE) 1 % cream Apply 1 application topically daily as needed.     Historical Provider, MD     Family History  Problem Relation Age of Onset  . Breast cancer Mother   . Pancreatic cancer Mother   . Diabetes Mother   . Heart disease Mother   . Heart disease Father    . COPD Father   . Melanoma Sister   . Stroke Brother   . Colon cancer Neg Hx     Social History   Social History  . Marital status: Married    Spouse name: N/A  . Number of children: 2  . Years of education: N/A   Occupational History  . retired Retired   Social History Main Topics  . Smoking status: Never Smoker  . Smokeless tobacco: Never Used  . Alcohol use No  . Drug use: No  . Sexual activity: Not on file   Other Topics Concern  . Not on file   Social History Narrative  . No narrative on file     Review of Systems: A 12 point ROS discussed and pertinent positives are indicated in  the HPI above.  All other systems are negative.  Review of Systems  Vital Signs: BP 114/73 (BP Location: Left Arm, Patient Position: Sitting, Cuff Size: Normal)   Pulse 81   Temp 97.7 F (36.5 C) (Oral)   Resp 14   Ht 5\' 11"  (1.803 m)   Wt 160 lb (72.6 kg)   BMI 22.32 kg/m   Physical Exam Targeted exam demonstrates a shallow ulcer on the plantar aspect of the right first metatarsal head, right foot. No evidence of erythema or purulent material. There is good granulation tissue at the margin of the ulcer, which is less than the size of a dime. Good Doppler signal at dorsalis pedis and posterior tibial identified.  Imaging: Ir Angiogram Extremity Right  Result Date: 02/07/2017 INDICATION: 71 year old male with nonhealing wound at the base of the right foot/great toe. He presents today for angiogram and possible intervention. EXAM: ULTRASOUND GUIDED ACCESS LEFT COMMON FEMORAL ARTERY ANGIOGRAM RIGHT LOWER EXTREMITY PELVIC ANGIOGRAM REVASCULARIZATION CHRONIC TOTAL OCCLUSION RIGHT POSTERIOR TIBIAL ARTERY PHARMACOLOGIC THROMBECTOMY RIGHT ANTERIOR TIBIAL ARTERY, MECHANICAL THROMBECTOMY RIGHT ANTERIOR TIBIAL ARTERY DEPLOYMENT OF CLOSURE DEVICE AT COMMON FEMORAL ARTERY MEDICATIONS: 8000 units IV heparin 500 mcg nitroglycerin intra-arterial 8 mg tPA intra arterial ANESTHESIA/SEDATION:  Moderate (conscious) sedation was employed during this procedure. A total of Versed 2.0 mg and Fentanyl 100 mcg was administered intravenously. Moderate Sedation Time: 165 minutes. The patient's level of consciousness and vital signs were monitored continuously by radiology nursing throughout the procedure under my direct supervision. CONTRAST:  150 cc FLUOROSCOPY TIME:  Fluoroscopy Time: 25 minutes 54 seconds (108 mGy). COMPLICATIONS: None PROCEDURE: Informed consent was obtained from the patient following explanation of the procedure, risks, benefits and alternatives. The patient understands, agrees and consents for the procedure. All questions were addressed. A time out was performed prior to the initiation of the procedure. Maximal barrier sterile technique utilized including caps, mask, sterile gowns, sterile gloves, large sterile drape, hand hygiene, and Betadine prep. Ultrasound survey of the left inguinal region was performed with images stored and sent to PACs. A micropuncture needle was used access the left common femoral artery under ultrasound. With excellent arterial blood flow returned, and an .018 micro wire was passed through the needle, observed enter the abdominal aorta under fluoroscopy. The needle was removed, and a micropuncture sheath was placed over the wire. The inner dilator and wire were removed, and an 035 Bentson wire was advanced under fluoroscopy into the abdominal aorta. The sheath was removed and a standard 5 [place] vascular sheath was placed. The dilator was removed and the sheath was flushed. Omni Flush catheter was placed into the abdominal aorta. Angiogram performed. Bentson wire was then navigated into the right external iliac artery. Flush catheter was removed and vertebral artery was then advanced over the wire into the external iliac artery. Angiogram was performed of the right lower extremity. Patient was heparinized with 6000U of IV heparin. Rosen wire advanced to the  vertebral catheter, and the short 5 French sheath was exchanged for a 45 cm 6 [place] braided sheath. 018 glide GT wire was navigated with a quick cross catheter into the origin of the posterior tibial artery. 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 With the catheter on the distal aspect of the occlusion, the wire was removed an angiogram was performed to confirm intraluminal location. Balanced middle weight wire was then advanced through the catheter, which was removed. Angioplasty of  2 mm and 3 mm balloon angioplasty performed through the length of the occlusion. Repeat angiogram performed confirming in-line flow from the popliteal artery through the posterior tibial artery to the plantar loop. Slow flow through the anterior tibial artery require treatment, potentially from an embolus from the sheath, as catheter and wires never entered the origin of the anterior tibial artery. Vertebral catheter was navigated over wire to the origin of the anterior tibial artery. 2 mg of tPA infused into the origin of the anterior tibial artery. Quick cross catheter and the GT Glidewire were then navigated through the length of the anterior tibial artery to the ankle. Balance maleate wire was passed through the catheter, and balloon angioplasty of the length of the anterior tibial artery performed with 2 mm and subsequently 3 mm balloon angioplasty. A total of 8 mg tPA was infused as well as 500 mcg nitro. Completion angiogram performed with confirmation of in-line flow through the anterior tibial artery and posterior tibial artery to the ankle. The long sheath was exchanged for a short 6 [place] sheath. Angiogram was performed. Exoseal was deployed. Patient tolerated the procedure well and remained hemodynamically stable throughout. Intraoperative embolism to the anterior tibial artery was encountered, treated with pharmacologic treatment and mechanical  maceration. No significant blood loss. FINDINGS: Pelvic angiogram demonstrates no significant distal aortic plaque. Bilateral common iliac artery external iliac artery and hypogastric arteries widely patent without significant atherosclerotic changes. Bilateral lumbar arteries patent at the L4-L5 levels. Right common femoral artery, profunda femoris and superficial femoral artery without significant disease. Popliteal artery widely patent. Atherosclerotic changes of the tibial vessels beyond the origin. Initial angiogram demonstrates occlusion in the mid third of the posterior tibial artery with distal reconstitution via collateral flow. There is small caliber and diseased anterior tibial artery and peroneal artery. Peroneal artery is discontinuous to the ankle. Angiogram of the foot vasculature demonstrates significantly small vessels with small pedal loop. Status post balloon angioplasty of the chronic total occlusion mid third of the posterior tibial artery there is reconstitution of in-line flow from the tibioperoneal artery to the ankle and through the pedal loop. No significant residual stenosis though disease persists. After treatment, there is slow flow through the anterior tibial artery, presumed embolus from the sheath while heparinized. After treatment of the embolus there is restoration of in-line flow through the length of the anterior tibial artery to the plantar loop. Angiogram of the left common femoral artery puncture site demonstrates adequate puncture. Completion Doppler exam with excellent pulses of the anterior tibial artery and posterior tibial artery. 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 Status post pharmacologic and mechanical treatment of intraoperative embolism to the anterior tibial artery. Signed, Dulcy Fanny. Earleen Newport, DO  [facility] Radiology PLAN: 4 hours observation Dual platelet treatment with Plavix an aspirin for least 30 days Clinic visit in 4 weeks. If further intervention required, would consider angio max during the case, as it appears patient may not respond well to heparin Electronically Signed   By: Corrie Mckusick D.O.   On: 02/07/2017 19:05   Ir Angiogram Selective Each Additional Vessel  Result Date: 02/07/2017 INDICATION: 71 year old male with nonhealing wound at the base of the right foot/great toe. He presents today for angiogram and possible intervention. EXAM: ULTRASOUND GUIDED ACCESS LEFT COMMON FEMORAL ARTERY ANGIOGRAM RIGHT LOWER EXTREMITY PELVIC ANGIOGRAM REVASCULARIZATION CHRONIC  TOTAL OCCLUSION RIGHT POSTERIOR TIBIAL ARTERY PHARMACOLOGIC THROMBECTOMY RIGHT ANTERIOR TIBIAL ARTERY, MECHANICAL THROMBECTOMY RIGHT ANTERIOR TIBIAL ARTERY DEPLOYMENT OF CLOSURE DEVICE AT COMMON FEMORAL ARTERY MEDICATIONS: 8000 units IV heparin 500 mcg nitroglycerin intra-arterial 8 mg tPA intra arterial ANESTHESIA/SEDATION: Moderate (conscious) sedation was employed during this procedure. A total of Versed 2.0 mg and Fentanyl 100 mcg was administered intravenously. Moderate Sedation Time: 165 minutes. The patient's level of consciousness and vital signs were monitored continuously by radiology nursing throughout the procedure under my direct supervision. CONTRAST:  150 cc FLUOROSCOPY TIME:  Fluoroscopy Time: 25 minutes 54 seconds (108 mGy). COMPLICATIONS: None PROCEDURE: Informed consent was obtained from the patient following explanation of the procedure, risks, benefits and alternatives. The patient understands, agrees and consents for the procedure. All questions were addressed. A time out was performed prior to the initiation of the procedure. Maximal barrier sterile technique utilized including caps, mask, sterile gowns, sterile gloves, large sterile drape, hand hygiene, and  Betadine prep. Ultrasound survey of the left inguinal region was performed with images stored and sent to PACs. A micropuncture needle was used access the left common femoral artery under ultrasound. With excellent arterial blood flow returned, and an .018 micro wire was passed through the needle, observed enter the abdominal aorta under fluoroscopy. The needle was removed, and a micropuncture sheath was placed over the wire. The inner dilator and wire were removed, and an 035 Bentson wire was advanced under fluoroscopy into the abdominal aorta. The sheath was removed and a standard 5 [place] vascular sheath was placed. The dilator was removed and the sheath was flushed. Omni Flush catheter was placed into the abdominal aorta. Angiogram performed. Bentson wire was then navigated into the right external iliac artery. Flush catheter was removed and vertebral artery was then advanced over the wire into the external iliac artery. Angiogram was performed of the right lower extremity. Patient was heparinized with 6000U of IV heparin. Rosen wire advanced to the vertebral catheter, and the short 5 French sheath was exchanged for a 45 cm 6 [place] braided sheath. 018 glide GT wire was navigated with a quick cross catheter into the origin of the posterior tibial artery. 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 With the catheter on the distal aspect of the occlusion, the wire was removed an angiogram was performed to confirm intraluminal location. Balanced middle weight wire was then advanced through the catheter, which was removed. Angioplasty of 2 mm and 3 mm balloon angioplasty performed through the length of the occlusion. Repeat angiogram performed confirming in-line flow from the popliteal artery through the posterior tibial artery to the plantar loop. Slow flow through the anterior tibial artery require treatment, potentially from an embolus from  the sheath, as catheter and wires never entered the origin of the anterior tibial artery. Vertebral catheter was navigated over wire to the origin of the anterior tibial artery. 2 mg of tPA infused into the origin of the anterior tibial artery. Quick cross catheter and the GT Glidewire were then navigated through the length of the anterior tibial artery to the ankle. Balance maleate wire was passed through the catheter, and balloon angioplasty of the length of the anterior tibial artery performed with 2 mm and subsequently 3 mm balloon angioplasty. A total of 8 mg tPA was infused as well as 500 mcg nitro. Completion angiogram performed with confirmation of in-line flow through the anterior tibial artery and posterior tibial artery  to the ankle. The long sheath was exchanged for a short 6 [place] sheath. Angiogram was performed. Exoseal was deployed. Patient tolerated the procedure well and remained hemodynamically stable throughout. Intraoperative embolism to the anterior tibial artery was encountered, treated with pharmacologic treatment and mechanical maceration. No significant blood loss. FINDINGS: Pelvic angiogram demonstrates no significant distal aortic plaque. Bilateral common iliac artery external iliac artery and hypogastric arteries widely patent without significant atherosclerotic changes. Bilateral lumbar arteries patent at the L4-L5 levels. Right common femoral artery, profunda femoris and superficial femoral artery without significant disease. Popliteal artery widely patent. Atherosclerotic changes of the tibial vessels beyond the origin. Initial angiogram demonstrates occlusion in the mid third of the posterior tibial artery with distal reconstitution via collateral flow. There is small caliber and diseased anterior tibial artery and peroneal artery. Peroneal artery is discontinuous to the ankle. Angiogram of the foot vasculature demonstrates significantly small vessels with small pedal loop. Status  post balloon angioplasty of the chronic total occlusion mid third of the posterior tibial artery there is reconstitution of in-line flow from the tibioperoneal artery to the ankle and through the pedal loop. No significant residual stenosis though disease persists. After treatment, there is slow flow through the anterior tibial artery, presumed embolus from the sheath while heparinized. After treatment of the embolus there is restoration of in-line flow through the length of the anterior tibial artery to the plantar loop. Angiogram of the left common femoral artery puncture site demonstrates adequate puncture. Completion Doppler exam with excellent pulses of the anterior tibial artery and posterior tibial artery. 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 Status post pharmacologic and mechanical treatment of intraoperative embolism to the anterior tibial artery. Signed, Dulcy Fanny. Earleen Newport, DO [facility] Radiology PLAN: 4 hours observation Dual platelet treatment with Plavix an aspirin for least 30 days Clinic visit in 4 weeks. If further intervention required, would consider angio max during the case, as it appears patient may not respond well to heparin Electronically Signed   By: Corrie Mckusick D.O.   On: 02/07/2017 19:05   Ir Thrombect Prim Mech Init (inclu) Mod Sed  Result Date: 02/11/2017 INDICATION: 71 year old male with nonhealing wound at the base of the right foot/great toe. He presents today for angiogram and possible intervention. EXAM: ULTRASOUND GUIDED ACCESS LEFT COMMON FEMORAL ARTERY ANGIOGRAM RIGHT LOWER EXTREMITY PELVIC ANGIOGRAM REVASCULARIZATION CHRONIC TOTAL OCCLUSION RIGHT POSTERIOR TIBIAL ARTERY PHARMACOLOGIC THROMBECTOMY RIGHT ANTERIOR TIBIAL ARTERY, MECHANICAL THROMBECTOMY RIGHT  ANTERIOR TIBIAL ARTERY DEPLOYMENT OF CLOSURE DEVICE AT COMMON FEMORAL ARTERY MEDICATIONS: 8000 units IV heparin 500 mcg nitroglycerin intra-arterial 8 mg tPA intra arterial ANESTHESIA/SEDATION: Moderate (conscious) sedation was employed during this procedure. A total of Versed 2.0 mg and Fentanyl 100 mcg was administered intravenously. Moderate Sedation Time: 165 minutes. The patient's level of consciousness and vital signs were monitored continuously by radiology nursing throughout the procedure under my direct supervision. CONTRAST:  150 cc FLUOROSCOPY TIME:  Fluoroscopy Time: 25 minutes 54 seconds (108 mGy). COMPLICATIONS: None PROCEDURE: Informed consent was obtained from the patient following explanation of the procedure, risks, benefits and alternatives. The patient understands, agrees and consents for the procedure. All questions were addressed. A time out was performed prior to the initiation of the procedure. Maximal barrier sterile technique utilized including caps, mask, sterile gowns, sterile gloves, large sterile drape, hand hygiene, and Betadine prep. Ultrasound  survey of the left inguinal region was performed with images stored and sent to PACs. A micropuncture needle was used access the left common femoral artery under ultrasound. With excellent arterial blood flow returned, and an .018 micro wire was passed through the needle, observed enter the abdominal aorta under fluoroscopy. The needle was removed, and a micropuncture sheath was placed over the wire. The inner dilator and wire were removed, and an 035 Bentson wire was advanced under fluoroscopy into the abdominal aorta. The sheath was removed and a standard 5 [place] vascular sheath was placed. The dilator was removed and the sheath was flushed. Omni Flush catheter was placed into the abdominal aorta. Angiogram performed. Bentson wire was then navigated into the right external iliac artery. Flush catheter was removed and vertebral artery was  then advanced over the wire into the external iliac artery. Angiogram was performed of the right lower extremity. Patient was heparinized with 6000U of IV heparin. Rosen wire advanced to the vertebral catheter, and the short 5 French sheath was exchanged for a 45 cm 6 [place] braided sheath. 018 glide GT wire was navigated with a quick cross catheter into the origin of the posterior tibial artery. 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 With the catheter on the distal aspect of the occlusion, the wire was removed an angiogram was performed to confirm intraluminal location. Balanced middle weight wire was then advanced through the catheter, which was removed. Angioplasty of 2 mm and 3 mm balloon angioplasty performed through the length of the occlusion. Repeat angiogram performed confirming in-line flow from the popliteal artery through the posterior tibial artery to the plantar loop. Slow flow through the anterior tibial artery require treatment, potentially from an embolus from the sheath, as catheter and wires never entered the origin of the anterior tibial artery. Vertebral catheter was navigated over wire to the origin of the anterior tibial artery. 2 mg of tPA infused into the origin of the anterior tibial artery. Quick cross catheter and the GT Glidewire were then navigated through the length of the anterior tibial artery to the ankle. Balance maleate wire was passed through the catheter, and balloon angioplasty of the length of the anterior tibial artery performed with 2 mm and subsequently 3 mm balloon angioplasty. A total of 8 mg tPA was infused as well as 500 mcg nitro. Completion angiogram performed with confirmation of in-line flow through the anterior tibial artery and posterior tibial artery to the ankle. The long sheath was exchanged for a short 6 [place] sheath. Angiogram was performed. Exoseal was deployed. Patient tolerated  the procedure well and remained hemodynamically stable throughout. Intraoperative embolism to the anterior tibial artery was encountered, treated with pharmacologic treatment and mechanical maceration. No significant blood loss. FINDINGS: Pelvic angiogram demonstrates no significant distal aortic plaque. Bilateral common iliac artery external iliac artery and hypogastric arteries widely patent without significant atherosclerotic changes. Bilateral lumbar arteries patent at the L4-L5 levels. Right common femoral artery, profunda femoris and superficial femoral artery without significant disease. Popliteal artery widely patent. Atherosclerotic changes of the tibial vessels beyond the origin. Initial angiogram demonstrates occlusion in the mid third of the posterior tibial artery with distal reconstitution via collateral flow. There is small caliber and diseased anterior tibial artery and peroneal artery. Peroneal artery is discontinuous to the ankle. Angiogram of the foot vasculature demonstrates significantly small vessels with small pedal loop. Status post balloon angioplasty of the chronic total  occlusion mid third of the posterior tibial artery there is reconstitution of in-line flow from the tibioperoneal artery to the ankle and through the pedal loop. No significant residual stenosis though disease persists. After treatment, there is slow flow through the anterior tibial artery, presumed embolus from the sheath while heparinized. After treatment of the embolus there is restoration of in-line flow through the length of the anterior tibial artery to the plantar loop. Angiogram of the left common femoral artery puncture site demonstrates adequate puncture. Completion Doppler exam with excellent pulses of the anterior tibial artery and posterior tibial artery. 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 Status post pharmacologic and mechanical treatment of intraoperative embolism to the anterior tibial artery. Signed, Dulcy Fanny. Earleen Newport, DO [facility] Radiology PLAN: 4 hours observation Dual platelet treatment with Plavix an aspirin for least 30 days Clinic visit in 4 weeks. If further intervention required, would consider angio max during the case, as it appears patient may not respond well to heparin Electronically Signed   By: Corrie Mckusick D.O.   On: 02/07/2017 19:05   Ir Tib-pero Art Pta Mod Sed  Result Date: 02/07/2017 INDICATION: 71 year old male with nonhealing wound at the base of the right foot/great toe. He presents today for angiogram and possible intervention. EXAM: ULTRASOUND GUIDED ACCESS LEFT COMMON FEMORAL ARTERY ANGIOGRAM RIGHT LOWER EXTREMITY PELVIC ANGIOGRAM REVASCULARIZATION CHRONIC TOTAL OCCLUSION RIGHT POSTERIOR TIBIAL ARTERY PHARMACOLOGIC THROMBECTOMY RIGHT ANTERIOR TIBIAL ARTERY, MECHANICAL THROMBECTOMY RIGHT ANTERIOR TIBIAL ARTERY DEPLOYMENT OF CLOSURE DEVICE AT COMMON FEMORAL ARTERY MEDICATIONS: 8000 units IV heparin 500 mcg nitroglycerin intra-arterial 8 mg tPA intra arterial ANESTHESIA/SEDATION: Moderate (conscious) sedation was employed during this procedure. A total of Versed 2.0 mg and Fentanyl 100 mcg was administered intravenously. Moderate Sedation Time: 165 minutes. The patient's level of consciousness and vital signs were monitored continuously by radiology nursing throughout the procedure under my direct supervision. CONTRAST:  150 cc FLUOROSCOPY TIME:  Fluoroscopy Time: 25 minutes 54 seconds (108 mGy). COMPLICATIONS: None PROCEDURE: Informed consent was obtained from the patient following explanation of the procedure, risks, benefits and alternatives. The patient understands, agrees and consents for the procedure. All questions were addressed. A time out was performed prior to  the initiation of the procedure. Maximal barrier sterile technique utilized including caps, mask, sterile gowns, sterile gloves, large sterile drape, hand hygiene, and Betadine prep. Ultrasound survey of the left inguinal region was performed with images stored and sent to PACs. A micropuncture needle was used access the left common femoral artery under ultrasound. With excellent arterial blood flow returned, and an .018 micro wire was passed through the needle, observed enter the abdominal aorta under fluoroscopy. The needle was removed, and a micropuncture sheath was placed over the wire. The inner dilator and wire were removed, and an 035 Bentson wire was advanced under fluoroscopy into the abdominal aorta. The sheath was removed and a standard 5 [place] vascular sheath was placed. The dilator was removed and the sheath was flushed. Omni Flush catheter was placed into the abdominal aorta. Angiogram performed. Bentson wire was then navigated into the right external iliac artery. Flush catheter was removed and vertebral artery was then advanced over the wire into the external iliac artery. Angiogram was performed of the right lower extremity. Patient was heparinized with 6000U of IV heparin. Rosen wire advanced to the vertebral catheter, and  the short 5 [place] sheath was exchanged for a 45 cm 6 [place] braided sheath. 018 glide GT wire was navigated with a quick cross catheter into the origin of the posterior tibial artery. 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 With the catheter on the distal aspect of the occlusion, the wire was removed an angiogram was performed to confirm intraluminal location. Balanced middle weight wire was then advanced through the catheter, which was removed. Angioplasty of 2 mm and 3 mm balloon angioplasty performed through the length of the occlusion. Repeat angiogram performed confirming in-line flow from the  popliteal artery through the posterior tibial artery to the plantar loop. Slow flow through the anterior tibial artery require treatment, potentially from an embolus from the sheath, as catheter and wires never entered the origin of the anterior tibial artery. Vertebral catheter was navigated over wire to the origin of the anterior tibial artery. 2 mg of tPA infused into the origin of the anterior tibial artery. Quick cross catheter and the GT Glidewire were then navigated through the length of the anterior tibial artery to the ankle. Balance maleate wire was passed through the catheter, and balloon angioplasty of the length of the anterior tibial artery performed with 2 mm and subsequently 3 mm balloon angioplasty. A total of 8 mg tPA was infused as well as 500 mcg nitro. Completion angiogram performed with confirmation of in-line flow through the anterior tibial artery and posterior tibial artery to the ankle. The long sheath was exchanged for a short 6 [place] sheath. Angiogram was performed. Exoseal was deployed. Patient tolerated the procedure well and remained hemodynamically stable throughout. Intraoperative embolism to the anterior tibial artery was encountered, treated with pharmacologic treatment and mechanical maceration. No significant blood loss. FINDINGS: Pelvic angiogram demonstrates no significant distal aortic plaque. Bilateral common iliac artery external iliac artery and hypogastric arteries widely patent without significant atherosclerotic changes. Bilateral lumbar arteries patent at the L4-L5 levels. Right common femoral artery, profunda femoris and superficial femoral artery without significant disease. Popliteal artery widely patent. Atherosclerotic changes of the tibial vessels beyond the origin. Initial angiogram demonstrates occlusion in the mid third of the posterior tibial artery with distal reconstitution via collateral flow. There is small caliber and diseased anterior tibial artery and  peroneal artery. Peroneal artery is discontinuous to the ankle. Angiogram of the foot vasculature demonstrates significantly small vessels with small pedal loop. Status post balloon angioplasty of the chronic total occlusion mid third of the posterior tibial artery there is reconstitution of in-line flow from the tibioperoneal artery to the ankle and through the pedal loop. No significant residual stenosis though disease persists. After treatment, there is slow flow through the anterior tibial artery, presumed embolus from the sheath while heparinized. After treatment of the embolus there is restoration of in-line flow through the length of the anterior tibial artery to the plantar loop. Angiogram of the left common femoral artery puncture site demonstrates adequate puncture. Completion Doppler exam with excellent pulses of the anterior tibial artery and posterior tibial artery. 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 Status post pharmacologic and mechanical treatment of intraoperative embolism to the anterior tibial artery. Signed, Dulcy Fanny. Earleen Newport, DO [facility] Radiology PLAN: 4 hours observation Dual platelet treatment with Plavix an aspirin for least 30 days  Clinic visit in 4 weeks. If further intervention required, would consider angio max during the case, as it appears patient may not respond well to heparin Electronically Signed   By: Corrie Mckusick D.O.   On: 02/07/2017 19:05   Ir Tib-pero Art Uni Pta Ea Add Vessel Mod Sed  Result Date: 02/07/2017 INDICATION: 71 year old male with nonhealing wound at the base of the right foot/great toe. He presents today for angiogram and possible intervention. EXAM: ULTRASOUND GUIDED ACCESS LEFT COMMON FEMORAL ARTERY ANGIOGRAM RIGHT LOWER EXTREMITY PELVIC  ANGIOGRAM REVASCULARIZATION CHRONIC TOTAL OCCLUSION RIGHT POSTERIOR TIBIAL ARTERY PHARMACOLOGIC THROMBECTOMY RIGHT ANTERIOR TIBIAL ARTERY, MECHANICAL THROMBECTOMY RIGHT ANTERIOR TIBIAL ARTERY DEPLOYMENT OF CLOSURE DEVICE AT COMMON FEMORAL ARTERY MEDICATIONS: 8000 units IV heparin 500 mcg nitroglycerin intra-arterial 8 mg tPA intra arterial ANESTHESIA/SEDATION: Moderate (conscious) sedation was employed during this procedure. A total of Versed 2.0 mg and Fentanyl 100 mcg was administered intravenously. Moderate Sedation Time: 165 minutes. The patient's level of consciousness and vital signs were monitored continuously by radiology nursing throughout the procedure under my direct supervision. CONTRAST:  150 cc FLUOROSCOPY TIME:  Fluoroscopy Time: 25 minutes 54 seconds (108 mGy). COMPLICATIONS: None PROCEDURE: Informed consent was obtained from the patient following explanation of the procedure, risks, benefits and alternatives. The patient understands, agrees and consents for the procedure. All questions were addressed. A time out was performed prior to the initiation of the procedure. Maximal barrier sterile technique utilized including caps, mask, sterile gowns, sterile gloves, large sterile drape, hand hygiene, and Betadine prep. Ultrasound survey of the left inguinal region was performed with images stored and sent to PACs. A micropuncture needle was used access the left common femoral artery under ultrasound. With excellent arterial blood flow returned, and an .018 micro wire was passed through the needle, observed enter the abdominal aorta under fluoroscopy. The needle was removed, and a micropuncture sheath was placed over the wire. The inner dilator and wire were removed, and an 035 Bentson wire was advanced under fluoroscopy into the abdominal aorta. The sheath was removed and a standard 5 [place] vascular sheath was placed. The dilator was removed and the sheath was flushed. Omni Flush catheter was placed  into the abdominal aorta. Angiogram performed. Bentson wire was then navigated into the right external iliac artery. Flush catheter was removed and vertebral artery was then advanced over the wire into the external iliac artery. Angiogram was performed of the right lower extremity. Patient was heparinized with 6000U of IV heparin. Rosen wire advanced to the vertebral catheter, and the short 5 [place] sheath was exchanged for a 45 cm 6 [place] braided sheath. 018 glide GT wire was navigated with a quick cross catheter into the origin of the posterior tibial artery. 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 With the catheter on the distal aspect of the occlusion, the wire was removed an angiogram was performed to confirm intraluminal location. Balanced middle weight wire was then advanced through the catheter, which was removed. Angioplasty of 2 mm and 3 mm balloon angioplasty performed through the length of the occlusion. Repeat angiogram performed confirming in-line flow from the popliteal artery through the posterior tibial artery to the plantar loop. Slow flow through the anterior tibial artery require treatment, potentially from an embolus from the sheath, as catheter and wires never entered the origin of the anterior tibial artery. Vertebral catheter was navigated over wire to the origin of the anterior tibial artery.  2 mg of tPA infused into the origin of the anterior tibial artery. Quick cross catheter and the GT Glidewire were then navigated through the length of the anterior tibial artery to the ankle. Balance maleate wire was passed through the catheter, and balloon angioplasty of the length of the anterior tibial artery performed with 2 mm and subsequently 3 mm balloon angioplasty. A total of 8 mg tPA was infused as well as 500 mcg nitro. Completion angiogram performed with confirmation of in-line flow through the anterior tibial artery  and posterior tibial artery to the ankle. The long sheath was exchanged for a short 6 [place] sheath. Angiogram was performed. Exoseal was deployed. Patient tolerated the procedure well and remained hemodynamically stable throughout. Intraoperative embolism to the anterior tibial artery was encountered, treated with pharmacologic treatment and mechanical maceration. No significant blood loss. FINDINGS: Pelvic angiogram demonstrates no significant distal aortic plaque. Bilateral common iliac artery external iliac artery and hypogastric arteries widely patent without significant atherosclerotic changes. Bilateral lumbar arteries patent at the L4-L5 levels. Right common femoral artery, profunda femoris and superficial femoral artery without significant disease. Popliteal artery widely patent. Atherosclerotic changes of the tibial vessels beyond the origin. Initial angiogram demonstrates occlusion in the mid third of the posterior tibial artery with distal reconstitution via collateral flow. There is small caliber and diseased anterior tibial artery and peroneal artery. Peroneal artery is discontinuous to the ankle. Angiogram of the foot vasculature demonstrates significantly small vessels with small pedal loop. Status post balloon angioplasty of the chronic total occlusion mid third of the posterior tibial artery there is reconstitution of in-line flow from the tibioperoneal artery to the ankle and through the pedal loop. No significant residual stenosis though disease persists. After treatment, there is slow flow through the anterior tibial artery, presumed embolus from the sheath while heparinized. After treatment of the embolus there is restoration of in-line flow through the length of the anterior tibial artery to the plantar loop. Angiogram of the left common femoral artery puncture site demonstrates adequate puncture. Completion Doppler exam with excellent pulses of the anterior tibial artery and posterior tibial  artery. 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 Status post pharmacologic and mechanical treatment of intraoperative embolism to the anterior tibial artery. Signed, Dulcy Fanny. Earleen Newport, DO [facility] Radiology PLAN: 4 hours observation Dual platelet treatment with Plavix an aspirin for least 30 days Clinic visit in 4 weeks. If further intervention required, would consider angio max during the case, as it appears patient may not respond well to heparin Electronically Signed   By: Corrie Mckusick D.O.   On: 02/07/2017 19:05   Ir US Guide Vasc Access Left  Result Date: 02/07/2017 INDICATION: 71 year old male with nonhealing wound at the base of the right foot/great toe. He presents today for angiogram and possible intervention. EXAM: ULTRASOUND GUIDED ACCESS LEFT COMMON FEMORAL ARTERY ANGIOGRAM RIGHT LOWER EXTREMITY PELVIC ANGIOGRAM REVASCULARIZATION CHRONIC TOTAL OCCLUSION RIGHT POSTERIOR TIBIAL ARTERY PHARMACOLOGIC THROMBECTOMY RIGHT ANTERIOR TIBIAL ARTERY, MECHANICAL THROMBECTOMY RIGHT ANTERIOR TIBIAL ARTERY DEPLOYMENT OF CLOSURE DEVICE AT COMMON FEMORAL ARTERY MEDICATIONS: 8000 units IV heparin 500 mcg nitroglycerin intra-arterial 8 mg tPA intra arterial ANESTHESIA/SEDATION: Moderate (conscious) sedation was employed during this procedure. A total of Versed 2.0 mg and Fentanyl 100 mcg was administered intravenously. Moderate Sedation Time: 165 minutes. The patient's level of consciousness and vital signs were  monitored continuously by radiology nursing throughout the procedure under my direct supervision. CONTRAST:  150 cc FLUOROSCOPY TIME:  Fluoroscopy Time: 25 minutes 54 seconds (108 mGy). COMPLICATIONS: None PROCEDURE: Informed consent was obtained from the patient following explanation of the procedure,  risks, benefits and alternatives. The patient understands, agrees and consents for the procedure. All questions were addressed. A time out was performed prior to the initiation of the procedure. Maximal barrier sterile technique utilized including caps, mask, sterile gowns, sterile gloves, large sterile drape, hand hygiene, and Betadine prep. Ultrasound survey of the left inguinal region was performed with images stored and sent to PACs. A micropuncture needle was used access the left common femoral artery under ultrasound. With excellent arterial blood flow returned, and an .018 micro wire was passed through the needle, observed enter the abdominal aorta under fluoroscopy. The needle was removed, and a micropuncture sheath was placed over the wire. The inner dilator and wire were removed, and an 035 Bentson wire was advanced under fluoroscopy into the abdominal aorta. The sheath was removed and a standard 5 [place] vascular sheath was placed. The dilator was removed and the sheath was flushed. Omni Flush catheter was placed into the abdominal aorta. Angiogram performed. Bentson wire was then navigated into the right external iliac artery. Flush catheter was removed and vertebral artery was then advanced over the wire into the external iliac artery. Angiogram was performed of the right lower extremity. Patient was heparinized with 6000U of IV heparin. Rosen wire advanced to the vertebral catheter, and the short 5 [place] sheath was exchanged for a 45 cm 6 [place] braided sheath. 018 glide GT wire was navigated with a quick cross catheter into the origin of the posterior tibial artery. 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 With the catheter on the distal aspect of the occlusion, the wire was removed an angiogram was performed to confirm intraluminal location. Balanced middle weight wire was then advanced through the catheter, which was removed.  Angioplasty of 2 mm and 3 mm balloon angioplasty performed through the length of the occlusion. Repeat angiogram performed confirming in-line flow from the popliteal artery through the posterior tibial artery to the plantar loop. Slow flow through the anterior tibial artery require treatment, potentially from an embolus from the sheath, as catheter and wires never entered the origin of the anterior tibial artery. Vertebral catheter was navigated over wire to the origin of the anterior tibial artery. 2 mg of tPA infused into the origin of the anterior tibial artery. Quick cross catheter and the GT Glidewire were then navigated through the length of the anterior tibial artery to the ankle. Balance maleate wire was passed through the catheter, and balloon angioplasty of the length of the anterior tibial artery performed with 2 mm and subsequently 3 mm balloon angioplasty. A total of 8 mg tPA was infused as well as 500 mcg nitro. Completion angiogram performed with confirmation of in-line flow through the anterior tibial artery and posterior tibial artery to the ankle. The long sheath was exchanged for a short 6 [place] sheath. Angiogram was performed. Exoseal was deployed. Patient tolerated the procedure well and remained hemodynamically stable throughout. Intraoperative embolism to the anterior tibial artery was encountered, treated with pharmacologic treatment and mechanical maceration. No significant blood loss. FINDINGS: Pelvic angiogram demonstrates no significant distal aortic plaque. Bilateral common iliac artery external iliac artery and hypogastric arteries widely patent without significant atherosclerotic changes. Bilateral  lumbar arteries patent at the L4-L5 levels. Right common femoral artery, profunda femoris and superficial femoral artery without significant disease. Popliteal artery widely patent. Atherosclerotic changes of the tibial vessels beyond the origin. Initial angiogram demonstrates occlusion in  the mid third of the posterior tibial artery with distal reconstitution via collateral flow. There is small caliber and diseased anterior tibial artery and peroneal artery. Peroneal artery is discontinuous to the ankle. Angiogram of the foot vasculature demonstrates significantly small vessels with small pedal loop. Status post balloon angioplasty of the chronic total occlusion mid third of the posterior tibial artery there is reconstitution of in-line flow from the tibioperoneal artery to the ankle and through the pedal loop. No significant residual stenosis though disease persists. After treatment, there is slow flow through the anterior tibial artery, presumed embolus from the sheath while heparinized. After treatment of the embolus there is restoration of in-line flow through the length of the anterior tibial artery to the plantar loop. Angiogram of the left common femoral artery puncture site demonstrates adequate puncture. Completion Doppler exam with excellent pulses of the anterior tibial artery and posterior tibial artery. 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 Status post pharmacologic and mechanical treatment of intraoperative embolism to the anterior tibial artery. Signed, Dulcy Fanny. Earleen Newport, DO [facility] Radiology PLAN: 4 hours observation Dual platelet treatment with Plavix an aspirin for least 30 days Clinic visit in 4 weeks. If further intervention required, would consider angio max during the case, as it appears patient may not respond well to heparin Electronically Signed   By: Corrie Mckusick D.O.   On: 02/07/2017 19:05    Labs:  CBC:  Recent Labs  02/07/17 1043  WBC 9.3  HGB 13.8  HCT 42.4  PLT 221    COAGS:  Recent Labs  02/07/17 1043  INR 1.10  APTT 35     BMP:  Recent Labs  02/07/17 1043  NA 140  K 4.4  CL 105  CO2 26  GLUCOSE 89  BUN 27*  CALCIUM 9.5  CREATININE 1.07  GFRNONAA >60  GFRAA >60    LIVER FUNCTION TESTS: No results for input(s): BILITOT, AST, ALT, ALKPHOS, PROT, ALBUMIN in the last 8760 hours.  TUMOR MARKERS: No results for input(s): AFPTM, CEA, CA199, CHROMGRNA in the last 8760 hours.  Assessment and Plan:  Mr Hersch is about 3 weeks status post revascularization of right-sided tibial vessels for treatment of wound at the base of the first metatarsal, plantar aspect. There are quite satisfied with his progression of wound healing, as well as his decreased pain at this foot.  I have encouraged him to observe all of his upcoming wound care appointments, surgical appointments, and podiatry appointments.  We will continue to see him in the clinic, with the next appointment in 3 or 4 months. At this time we will repeat noninvasive imaging of the lower extremity.  We had additional discussion about occasional pain he has in his left foot. Given the appearance of the prior noninvasive imaging, I did let them both know that I feel there is low likelihood of vascular changes contributing to the occasional pain he complains of on the left. Certainly we can keep our eye on this left foot/left leg on future noninvasive imaging.  Plan: Follow up office appointment with repeat noninvasive lower extremity exam in 3-4 months  I have encouraged him to continue medical therapy, including antiplatelet medication with Plavix daily I have encouraged him to observe all of his future appointments with surgery, wound care, and podiatry   Electronically Signed: Corrie Mckusick 02/27/2017, 5:14 PM   I spent a total of    25 Minutes in face to face in clinical consultation, greater than 50% of which was counseling/coordinating care for right lower extremity nonhealing wound, critical limb ischemia, status post angiogram and  revascularization of right tibial artery

## 2017-02-28 DIAGNOSIS — L97509 Non-pressure chronic ulcer of other part of unspecified foot with unspecified severity: Secondary | ICD-10-CM | POA: Diagnosis not present

## 2017-02-28 DIAGNOSIS — I739 Peripheral vascular disease, unspecified: Secondary | ICD-10-CM | POA: Diagnosis not present

## 2017-02-28 DIAGNOSIS — I7300 Raynaud's syndrome without gangrene: Secondary | ICD-10-CM | POA: Diagnosis not present

## 2017-03-06 ENCOUNTER — Telehealth: Payer: Self-pay | Admitting: Cardiovascular Disease

## 2017-03-06 NOTE — Telephone Encounter (Signed)
Received records from [place] for appointment on 03/26/17 with Dr Gwenlyn Found.  Records put with Dr Kennon Holter schedule for 03/26/17. lp

## 2017-03-07 DIAGNOSIS — I739 Peripheral vascular disease, unspecified: Secondary | ICD-10-CM | POA: Diagnosis not present

## 2017-03-07 DIAGNOSIS — L97512 Non-pressure chronic ulcer of other part of right foot with fat layer exposed: Secondary | ICD-10-CM | POA: Diagnosis not present

## 2017-03-07 DIAGNOSIS — I7300 Raynaud's syndrome without gangrene: Secondary | ICD-10-CM | POA: Diagnosis not present

## 2017-03-07 DIAGNOSIS — L97509 Non-pressure chronic ulcer of other part of unspecified foot with unspecified severity: Secondary | ICD-10-CM | POA: Diagnosis not present

## 2017-03-07 DIAGNOSIS — M79671 Pain in right foot: Secondary | ICD-10-CM | POA: Diagnosis not present

## 2017-03-07 DIAGNOSIS — Q828 Other specified congenital malformations of skin: Secondary | ICD-10-CM | POA: Diagnosis not present

## 2017-03-11 MED ORDER — NA SULFATE-K SULFATE-MG SULF 17.5-3.13-1.6 GM/177ML PO SOLN: ORAL | 0 refills | Status: AC

## 2017-03-12 ENCOUNTER — Encounter: Payer: Self-pay | Admitting: Internal Medicine

## 2017-03-13 ENCOUNTER — Other Ambulatory Visit: Payer: PPO

## 2017-03-14 ENCOUNTER — Telehealth: Payer: Self-pay | Admitting: *Deleted

## 2017-03-14 NOTE — Telephone Encounter (Signed)
Pt is on Plavix since an angiogram on 02-07-17.  Pt had a blocked artery in the left lower leg- was given heparin and then started on plavix. Pt has colon for 4-30 Monday. Per Dr Henrene Pastor, we need to cancel Monday 4-30 colon and once pt has completed the plavix and has a follow up with the Dr that started the plavix, pt needs to call and RS his colon.  Called and spoke to wife- she was informed of above- wife states she is very thankful we noticed this- instructed her once he is off the plavix and has the follow up, ask the prescribing MD about the colon and once he gets the okay to proceed with the colon, they need to call back and RS the colon. Wife verbalized understanding of this.  I cancelled the colon scheduled for 03-18-17 Monday   Lenard Galloway RN

## 2017-03-18 ENCOUNTER — Encounter: Payer: PPO | Admitting: Internal Medicine

## 2017-03-20 DIAGNOSIS — L97509 Non-pressure chronic ulcer of other part of unspecified foot with unspecified severity: Secondary | ICD-10-CM | POA: Diagnosis not present

## 2017-03-20 DIAGNOSIS — I739 Peripheral vascular disease, unspecified: Secondary | ICD-10-CM | POA: Diagnosis not present

## 2017-03-20 DIAGNOSIS — I7300 Raynaud's syndrome without gangrene: Secondary | ICD-10-CM | POA: Diagnosis not present

## 2017-03-21 ENCOUNTER — Encounter: Payer: Self-pay | Admitting: Anesthesiology

## 2017-03-26 ENCOUNTER — Encounter: Payer: Self-pay | Admitting: Cardiovascular Disease

## 2017-03-26 ENCOUNTER — Ambulatory Visit (INDEPENDENT_AMBULATORY_CARE_PROVIDER_SITE_OTHER): Payer: PPO | Admitting: Cardiovascular Disease

## 2017-03-26 DIAGNOSIS — I739 Peripheral vascular disease, unspecified: Secondary | ICD-10-CM

## 2017-03-26 NOTE — Assessment & Plan Note (Signed)
Robert Carey is referred to be established our practice. He had a nonhealing ulcer at the base of his right great toe. He had Dopplers that revealed a right ABI of 1.16 and left a 1.37 although he did have monophasic waveforms distally on the right side. He underwent peripheral angiography by Dr. Corrie Mckusick, interventional radiologist, on 02/07/17 recanalized thing and occluded posterior tibial artery. The wound has since healed. He does have 2+ dorsalis pedis and posterior tibial pulses on both sides. He complains of some left great toe pain which I do not think is vascular.

## 2017-03-26 NOTE — Progress Notes (Signed)
03/26/2017 Robert Carey   13-Jul-1946  [id number]  Primary Physician Robert Noble, MD Primary Cardiologist: Robert Harp MD Robert Carey  HPI:  Robert Carey is a delightful 71 year old thin-appearing married Caucasian male father of 2, grandfather and 4 grandchildren accompanied by his wife Robert Carey.  He is retired Forensic psychologist. He is referred to be established in our cardiovascular practice because of risk factors. Sacroiliitis family history father who had heart attack at early age. He has never smoked. He is does not have diabetes, retention or hyperlipidemia. He does have a history of a seizure disorder which has affected him cognitively including his memory. He has mixed connective tissue disease as well as ankylosing spondylitis. He has never had a heart attack or stroke. He denies chest pain or shortness of breath. He did have a nonhealing ulcer at the base of his right great toe and was referred to Dr. Corrie Carey, interventional radiologist, by his podiatrist, Dr. Caprice Carey. His lower extremity Dopplers revealed a right ABI of 1.16 with monophasic waveforms in his tibial arteries and a left ABI 1.37. He underwent angiography by Dr. Jacqualyn Carey 02/05/17 revealing an occluded right posterior tibial artery which was recanalized. His dorsal pedal arch was intact. His ulcer has since healed. He denies claudication.   Current Outpatient Prescriptions  Medication Sig Dispense Refill  . acetaminophen (TYLENOL) 500 MG tablet Take 500 mg by mouth every 6 (six) hours as needed.    Marland Kitchen aspirin EC 81 MG tablet Take 81 mg by mouth daily.    . Calcium Carbonate-Vitamin D (CALCARB 600/D PO) Take 1 tablet by mouth 2 (two) times daily.     . celecoxib (CELEBREX) 200 MG capsule Take 200 mg by mouth daily.    . clobetasol cream (TEMOVATE) 0.16 % Apply 1 application topically 2 (two) times daily as needed.     . clopidogrel (PLAVIX) 75 MG tablet Take 1 tablet (75 mg total) by mouth daily. 30  tablet 2  . ketoconazole (NIZORAL) 2 % cream Apply 1 application topically daily as needed for irritation.     . levETIRAcetam (KEPPRA) 250 MG tablet Take 375-500 mg by mouth See admin instructions. Takes two 750mg  tablets in the morning for a total of 1500mg  daily in the morning, 375 mg at noon, Takes two 250mg  tablets (500 mg) daily in the evening at 7pm, and 750 mg at 9p    . levETIRAcetam (KEPPRA) 750 MG tablet Take 750-1,500 mg by mouth See admin instructions. Takes two 750mg  tablets in the morning for a total of 1500mg  daily in the morning, 375 mg at noon, Takes two 250mg  tablets (500 mg) daily in the evening at 7pm, and 750 mg at 9p    . Multiple Vitamin (MULTIVITAMIN) capsule Take 1 capsule by mouth daily.    . Na Sulfate-K Sulfate-Mg Sulf 17.5-3.13-1.6 GM/180ML SOLN Suprep as directed.  No substitutions. 354 mL 0  . NIFEdipine (PROCARDIA XL/ADALAT-CC) 60 MG 24 hr tablet Take 60 mg by mouth every evening.     . NONFORMULARY OR COMPOUNDED ITEM Apply 1 application topically 2 (two) times daily as needed. Apply to neck twice daily as needed. Meloxicam, lidocaine, topiramate, prilocaine    . NONFORMULARY OR COMPOUNDED ITEM Apply 1 application topically 4 (four) times daily as needed. Apply to right foot as needed. Ketoconazole, diclofenac, baclofen, lidocaine, gabapentin    . omeprazole (PRILOSEC) 40 MG capsule TAKE (1) CAPSULE BY MOUTH ONCE DAILY. 30 capsule 3  . OXcarbazepine (TRILEPTAL) 150  MG tablet Take 150-300 mg by mouth See admin instructions. 150 mg in the morning, 300 mh in the evening    . predniSONE (DELTASONE) 5 MG tablet Take 5 mg by mouth daily.    . predniSONE (STERAPRED UNI-PAK 48 TAB) 5 MG (48) TBPK tablet Take 1 tablet by mouth as directed.    . silver sulfADIAZINE (SILVADENE) 1 % cream Apply 1 application topically daily as needed.      No current facility-administered medications for this visit.     Allergies  Allergen Reactions  . Dilantin [Phenytoin Sodium Extended]  Hives  . Phenytoin Sodium Extended Rash    Social History   Social History  . Marital status: Married    Spouse name: N/A  . Number of children: 2  . Years of education: N/A   Occupational History  . retired Retired   Social History Main Topics  . Smoking status: Never Smoker  . Smokeless tobacco: Never Used  . Alcohol use No  . Drug use: No  . Sexual activity: Not on file   Other Topics Concern  . Not on file   Social History Narrative  . No narrative on file     Review of Systems: General: negative for chills, fever, night sweats or weight changes.  Cardiovascular: negative for chest pain, dyspnea on exertion, edema, orthopnea, palpitations, paroxysmal nocturnal dyspnea or shortness of breath Dermatological: negative for rash Respiratory: negative for cough or wheezing Urologic: negative for hematuria Abdominal: negative for nausea, vomiting, diarrhea, bright red blood per rectum, melena, or hematemesis Neurologic: negative for visual changes, syncope, or dizziness All other systems reviewed and are otherwise negative except as noted above.    Blood pressure 130/86, pulse 82, height 5\' 11"  (1.803 m), weight 159 lb 9.6 oz (72.4 kg).  General appearance: alert and no distress Neck: no adenopathy, no carotid bruit, no JVD, supple, symmetrical, trachea midline and thyroid not enlarged, symmetric, no tenderness/mass/nodules Lungs: clear to auscultation bilaterally Heart: regular rate and rhythm, S1, S2 normal, no murmur, click, rub or gallop [id number] pedal pulses bilaterallyextremities normal, atraumatic, no cyanosis or edema and 2+ pedal pulses bilaterally  EKG sinus rhythm 82 with T-wave inversion in leads 1 and L. I personally reviewed this EKG  ASSESSMENT AND PLAN:   Peripheral arterial disease [facility]) [name] is referred to be established our practice. He had a nonhealing ulcer at the base of his right great toe. He had Dopplers that revealed a right ABI of  1.16 and left a 1.37 although he did have monophasic waveforms distally on the right side. He underwent peripheral angiography by Dr. Corrie Carey, interventional radiologist, on 02/07/17 recanalized thing and occluded posterior tibial artery. The wound has since healed. He does have 2+ dorsalis pedis and posterior tibial pulses on both sides. He complains of some left great toe pain which I do not think is vascular.      Robert Harp MD FACP,FACC,FAHA, [facility] 03/26/2017 10:19 AM

## 2017-03-26 NOTE — Addendum Note (Signed)
Addended by: Zebedee Iba on: 03/26/2017 10:27 AM   Modules accepted: Orders

## 2017-03-26 NOTE — Patient Instructions (Signed)
Dr Berry recommends that you schedule a follow-up appointment in 12 months. You will receive a reminder letter in the mail two months in advance. If you don't receive a letter, please call our office to schedule the follow-up appointment.  If you need a refill on your cardiac medications before your next appointment, please call your pharmacy. 

## 2017-04-01 ENCOUNTER — Other Ambulatory Visit: Payer: Self-pay | Admitting: Internal Medicine

## 2017-04-03 DIAGNOSIS — H6122 Impacted cerumen, left ear: Secondary | ICD-10-CM | POA: Diagnosis not present

## 2017-04-03 DIAGNOSIS — Z23 Encounter for immunization: Secondary | ICD-10-CM | POA: Diagnosis not present

## 2017-04-04 DIAGNOSIS — L821 Other seborrheic keratosis: Secondary | ICD-10-CM | POA: Diagnosis not present

## 2017-04-04 DIAGNOSIS — L820 Inflamed seborrheic keratosis: Secondary | ICD-10-CM | POA: Diagnosis not present

## 2017-04-04 DIAGNOSIS — B353 Tinea pedis: Secondary | ICD-10-CM | POA: Diagnosis not present

## 2017-04-09 DIAGNOSIS — L97512 Non-pressure chronic ulcer of other part of right foot with fat layer exposed: Secondary | ICD-10-CM | POA: Diagnosis not present

## 2017-04-09 DIAGNOSIS — Q828 Other specified congenital malformations of skin: Secondary | ICD-10-CM | POA: Diagnosis not present

## 2017-04-09 DIAGNOSIS — I739 Peripheral vascular disease, unspecified: Secondary | ICD-10-CM | POA: Diagnosis not present

## 2017-04-09 DIAGNOSIS — M79671 Pain in right foot: Secondary | ICD-10-CM | POA: Diagnosis not present

## 2017-04-10 DIAGNOSIS — R931 Abnormal findings on diagnostic imaging of heart and coronary circulation: Secondary | ICD-10-CM | POA: Diagnosis not present

## 2017-04-10 DIAGNOSIS — R569 Unspecified convulsions: Secondary | ICD-10-CM | POA: Diagnosis not present

## 2017-04-10 DIAGNOSIS — Z8669 Personal history of other diseases of the nervous system and sense organs: Secondary | ICD-10-CM | POA: Diagnosis not present

## 2017-04-10 DIAGNOSIS — Z8673 Personal history of transient ischemic attack (TIA), and cerebral infarction without residual deficits: Secondary | ICD-10-CM | POA: Diagnosis not present

## 2017-04-10 DIAGNOSIS — R9089 Other abnormal findings on diagnostic imaging of central nervous system: Secondary | ICD-10-CM | POA: Diagnosis not present

## 2017-04-10 DIAGNOSIS — R938 Abnormal findings on diagnostic imaging of other specified body structures: Secondary | ICD-10-CM | POA: Diagnosis not present

## 2017-04-10 DIAGNOSIS — R55 Syncope and collapse: Secondary | ICD-10-CM | POA: Diagnosis not present

## 2017-04-26 ENCOUNTER — Ambulatory Visit: Payer: PPO | Admitting: Podiatry

## 2017-04-30 DIAGNOSIS — I7300 Raynaud's syndrome without gangrene: Secondary | ICD-10-CM | POA: Diagnosis not present

## 2017-04-30 DIAGNOSIS — M351 Other overlap syndromes: Secondary | ICD-10-CM | POA: Diagnosis not present

## 2017-04-30 DIAGNOSIS — L97511 Non-pressure chronic ulcer of other part of right foot limited to breakdown of skin: Secondary | ICD-10-CM | POA: Diagnosis not present

## 2017-04-30 DIAGNOSIS — Z6821 Body mass index (BMI) 21.0-21.9, adult: Secondary | ICD-10-CM | POA: Diagnosis not present

## 2017-04-30 DIAGNOSIS — R296 Repeated falls: Secondary | ICD-10-CM | POA: Diagnosis not present

## 2017-04-30 DIAGNOSIS — M450 Ankylosing spondylitis of multiple sites in spine: Secondary | ICD-10-CM | POA: Diagnosis not present

## 2017-04-30 DIAGNOSIS — M542 Cervicalgia: Secondary | ICD-10-CM | POA: Diagnosis not present

## 2017-04-30 DIAGNOSIS — M810 Age-related osteoporosis without current pathological fracture: Secondary | ICD-10-CM | POA: Diagnosis not present

## 2017-05-02 ENCOUNTER — Other Ambulatory Visit: Payer: Self-pay | Admitting: Interventional Radiology

## 2017-05-02 DIAGNOSIS — L97511 Non-pressure chronic ulcer of other part of right foot limited to breakdown of skin: Secondary | ICD-10-CM

## 2017-05-03 ENCOUNTER — Encounter: Payer: Self-pay | Admitting: Interventional Radiology

## 2017-05-06 ENCOUNTER — Encounter: Payer: Self-pay | Admitting: Interventional Radiology

## 2017-05-10 ENCOUNTER — Ambulatory Visit (INDEPENDENT_AMBULATORY_CARE_PROVIDER_SITE_OTHER): Payer: PPO | Admitting: Podiatry

## 2017-05-10 DIAGNOSIS — B351 Tinea unguium: Secondary | ICD-10-CM

## 2017-05-10 DIAGNOSIS — L97519 Non-pressure chronic ulcer of other part of right foot with unspecified severity: Secondary | ICD-10-CM | POA: Diagnosis not present

## 2017-05-10 DIAGNOSIS — M79672 Pain in left foot: Secondary | ICD-10-CM | POA: Diagnosis not present

## 2017-05-10 DIAGNOSIS — M79671 Pain in right foot: Secondary | ICD-10-CM | POA: Diagnosis not present

## 2017-05-10 DIAGNOSIS — B353 Tinea pedis: Secondary | ICD-10-CM | POA: Diagnosis not present

## 2017-05-10 NOTE — Progress Notes (Signed)
   Subjective:    Patient ID: Robert Carey, male    DOB: [birth year], 71 y.o.   MRN: [id number]  HPI 71 year old male presents the office and his wife for evaluation of possible laser therapy for toenail fungus. His wife states that they have tried ketoconazole for the skin fungus but they're interested in possible treatment for the toenails. He states that he'll nails are not painful and not had any drainage or swelling or any pus.  He does follow with Dr. Caprice Beaver for other issues to his feet. He sees Dr. Caprice Beaver for what he had a wound on his right foot. Codeine denies any ulcerations.he has no other concerns today.   Review of Systems  Musculoskeletal: Positive for arthralgias, back pain and gait problem.  Neurological: Positive for tremors, seizures and weakness.       Objective:   Physical Exam General: AAO x3, NAD  Dermatological: Nails are very dystrophic, discolored with yellow to brown discoloration. There is no pain to the toenails there io swelling redness or drainage. There is no conical signs of infection. Dry skin is present and there is minimal tinea pedis interdigitally. There is no skin fissuring or open sores identified today.  Vascular: Dorsalis Pedis artery and Posterior Tibial artery pedal pulses are 1/4 bilateral with immedate capillary fill time.  There is no pain with calf compression, swelling, warmth, erythema.   Neruologic: Grossly intact via light touch bilateral. . Protective threshold with Semmes Wienstein monofilament decreased  bilateral.  Musculoskeletal: No gross boney pedal deformities bilateral. No pain, crepitus, or limitation noted with foot and ankle range of motion bilateral. Muscular strength 5/5 in all groups tested bilateral.     Assessment & Plan:  71 year old male with onychomycosis interested in laser therapy. -Treatment options discussed including all alternatives, risks, and complications -Etiology of symptoms were discussed -I  discussion with the patient his wife regardinglaser treatment as well as the success and outcomes. We will consider other options. I do recommend at least a topical treatment for the toenail fungus and he will start an over-the-counter one. If interested in starting with laser he will call back. -Continue to follow with Dr. Caprice Beaver. There is no open sores identified.  Celesta Gentile, DPM

## 2017-05-19 DIAGNOSIS — J690 Pneumonitis due to inhalation of food and vomit: Secondary | ICD-10-CM

## 2017-05-19 HISTORY — DX: Pneumonitis due to inhalation of food and vomit: J69.0

## 2017-05-27 ENCOUNTER — Other Ambulatory Visit: Payer: Self-pay | Admitting: Internal Medicine

## 2017-05-30 ENCOUNTER — Ambulatory Visit (HOSPITAL_COMMUNITY)
Admission: RE | Admit: 2017-05-30 | Discharge: 2017-05-30 | Disposition: A | Discharge status: Home / Self Care | Payer: PPO | Source: Ambulatory Visit | Attending: Internal Medicine | Admitting: Internal Medicine

## 2017-05-30 ENCOUNTER — Other Ambulatory Visit (HOSPITAL_COMMUNITY): Payer: Self-pay | Admitting: Internal Medicine

## 2017-05-30 DIAGNOSIS — R05 Cough: Secondary | ICD-10-CM | POA: Diagnosis not present

## 2017-05-30 DIAGNOSIS — R059 Cough, unspecified: Secondary | ICD-10-CM

## 2017-05-30 DIAGNOSIS — R918 Other nonspecific abnormal finding of lung field: Secondary | ICD-10-CM | POA: Insufficient documentation

## 2017-05-30 DIAGNOSIS — J90 Pleural effusion, not elsewhere classified: Secondary | ICD-10-CM | POA: Diagnosis not present

## 2017-06-04 ENCOUNTER — Other Ambulatory Visit (HOSPITAL_COMMUNITY): Payer: Self-pay | Admitting: Internal Medicine

## 2017-06-04 ENCOUNTER — Ambulatory Visit
Admission: RE | Admit: 2017-06-04 | Discharge: 2017-06-04 | Disposition: A | Discharge status: Home / Self Care | Payer: PPO | Source: Ambulatory Visit | Attending: Interventional Radiology | Admitting: Interventional Radiology

## 2017-06-04 ENCOUNTER — Telehealth: Payer: Self-pay | Admitting: Internal Medicine

## 2017-06-04 DIAGNOSIS — Z9889 Other specified postprocedural states: Secondary | ICD-10-CM | POA: Diagnosis not present

## 2017-06-04 DIAGNOSIS — R9389 Abnormal findings on diagnostic imaging of other specified body structures: Secondary | ICD-10-CM

## 2017-06-04 DIAGNOSIS — L97511 Non-pressure chronic ulcer of other part of right foot limited to breakdown of skin: Secondary | ICD-10-CM

## 2017-06-04 DIAGNOSIS — S91101A Unspecified open wound of right great toe without damage to nail, initial encounter: Secondary | ICD-10-CM | POA: Diagnosis not present

## 2017-06-04 DIAGNOSIS — S91101S Unspecified open wound of right great toe without damage to nail, sequela: Secondary | ICD-10-CM | POA: Diagnosis not present

## 2017-06-04 HISTORY — PX: IR RADIOLOGIST EVAL & MGMT: IMG5224

## 2017-06-04 NOTE — Progress Notes (Signed)
Chief Complaint: Right toe wound  Referring Physician(s): Dr. Marcheta Grammes, Podiatry  PCP: Dr. Willey Blade Cardiology: Dr. Gwenlyn Found  History of Present Illness: Robert Carey is a 71 y.o. male presenting today as a scheduled follow-up appointment, status post treatment of right toe wound, [place] 5 class symptoms of PAD.   We treated Mr Robert Carey 02/07/2017, with right lower extremity angiogram and tibial angioplasty, addressing a posterior tibial artery occlusion.    Since that time, he has had complete wound healing, and his toe pain has ceased.  He seems satisfied with his progression.    He tells me that his left toe occasionally hurts, but I am doubtful that this is related to CLI/rest pain.    He continues to see Dr. Caprice Beaver regularly for foot care.    Since we saw him last, he has established cardiology care with Dr. Jenness [facility] practice at our prompting, given his strong family history of early heart disease.    His wife shared with me a recent aspiration event that happened at home, with a follow-up CXR showing evidence of small pleural effusions and non-specific lung changes.  Although he denies any fevers/rigors/chills and overall feels fine, he does complain of non-productive cough.  His wife tells me that there was a plan for chest CT, and he has an upcoming appointment.  We discussed these symptoms briefly, and I do not feel that he needs urgent evaluation.  I think follow up as he has planned is appropriate, and his wife agrees.     Past Medical History:  Diagnosis Date  . Arthritis   . AS (ankylosing spondylitis) (HCC)   . Cataract   . CREST syndrome ([place])   . Dementia   . Diverticulosis of colon (without mention of hemorrhage)   . Esophageal reflux   . Gastroparesis   . Hiatal hernia   . Mixed connective tissue disease ([place])   . Motility disorder, esophageal   . Raynauds phenomenon   . Scleroderma ([place])   . Seizure disorder ([place])   . Seizures ([place])     07/2016  . Skin ulcers of foot, bilateral ([place]) 01/03/2017   R foot only  . Spondylitis, ankylosing ([place])   . Stricture and stenosis of esophagus     Past Surgical History:  Procedure Laterality Date  . IR GENERIC HISTORICAL  02/07/2017   IR TIB-PERO ART UNI PTA EA ADD VESSEL MOD SED 02/07/2017 Corrie Mckusick, DO [facility]  . IR GENERIC HISTORICAL  02/07/2017   IR US GUIDE VASC ACCESS LEFT 02/07/2017 Corrie Mckusick, DO [facility]  . IR GENERIC HISTORICAL  02/07/2017   IR TIB-PERO ART PTA MOD SED 02/07/2017 Corrie Mckusick, DO [facility]  . IR GENERIC HISTORICAL  02/07/2017   IR ANGIOGRAM SELECTIVE EACH ADDITIONAL VESSEL 02/07/2017 Corrie Mckusick, DO [facility]  . IR GENERIC HISTORICAL  02/07/2017   IR ANGIOGRAM EXTREMITY RIGHT 02/07/2017 Corrie Mckusick, DO [facility]  . IR GENERIC HISTORICAL  02/07/2017   IR THROMBECT PRIM MECH INIT (INCLU) MOD SED 02/07/2017 Corrie Mckusick, DO [facility]  . IR RADIOLOGIST EVAL & MGMT  02/27/2017  . IR RADIOLOGIST EVAL & MGMT  01/22/2017  . NOSE SURGERY  2005  . THUMB ARTHROSCOPY Left   . TOE SURGERY     right great toe  . WISDOM TOOTH EXTRACTION      Allergies: Dilantin [phenytoin sodium extended] and Phenytoin sodium extended  Medications: Prior to Admission medications   Medication Sig Start Date  End Date Taking? Authorizing Provider  acetaminophen (TYLENOL) 500 MG tablet Take 500 mg by mouth every 6 (six) hours as needed.   Yes [provider]  aspirin EC 81 MG tablet Take 81 mg by mouth daily.   Yes [provider]  Calcium Carbonate-Vitamin D (CALCARB 600/D PO) Take 1 tablet by mouth 2 (two) times daily.    Yes [provider]  celecoxib (CELEBREX) 200 MG capsule Take 200 mg by mouth daily.   Yes [provider]  clobetasol cream (TEMOVATE) 8.03 % Apply 1 application topically 2 (two) times daily as needed.    Yes [provider]  hydroxychloroquine (PLAQUENIL) 200 MG tablet  04/30/17  Yes [provider]  ketoconazole (NIZORAL) 2 % cream Apply 1 application topically daily as needed for irritation.    Yes [provider]  levETIRAcetam (KEPPRA) 250 MG tablet Take 375-500 mg by mouth See admin instructions. Takes two 750mg  tablets in the morning for a total of 1500mg  daily in the morning, 375 mg at noon, Takes two 250mg  tablets (500 mg) daily in the evening at 7pm, and 750 mg at 9p   Yes [provider]  levETIRAcetam (KEPPRA) 750 MG tablet Take 750-1,500 mg by mouth See admin instructions. Takes two 750mg  tablets in the morning for a total of 1500mg  daily in the morning, 375 mg at noon, Takes two 250mg  tablets (500 mg) daily in the evening at 7pm, and 750 mg at 9p   Yes [provider]  Multiple Vitamin (MULTIVITAMIN) capsule Take 1 capsule by mouth daily.   Yes [provider]  NIFEdipine (PROCARDIA XL/ADALAT-CC) 60 MG 24 hr tablet Take 60 mg by mouth every evening.    Yes [provider]  NONFORMULARY OR COMPOUNDED ITEM Apply 1 application topically 2 (two) times daily as needed. Apply to neck twice daily as needed. Meloxicam, lidocaine, topiramate, prilocaine   Yes [provider]  NONFORMULARY OR COMPOUNDED ITEM Apply 1 application topically 4 (four) times daily as needed. Apply to right foot as needed. Ketoconazole, diclofenac, baclofen, lidocaine, gabapentin   Yes [provider]  omeprazole (PRILOSEC) 40 MG capsule TAKE (1) CAPSULE BY MOUTH ONCE DAILY. 05/27/17  Yes Irene Shipper, MD  OXcarbazepine (TRILEPTAL) 150 MG tablet Take 2 tabs in the Am and 3 tabs in the PM 04/10/17  Yes [provider]  predniSONE (DELTASONE) 5 MG tablet Take 5 mg by mouth daily.   Yes [provider]  Na Sulfate-K Sulfate-Mg Sulf 17.5-3.13-1.6 GM/180ML SOLN Suprep as directed.  No substitutions. Patient not taking: Reported on 06/04/2017 03/11/17   Irene Shipper, MD  predniSONE (STERAPRED UNI-PAK 48 TAB) 5 MG (48) TBPK tablet  Take 1 tablet by mouth as directed. 03/25/17   [provider]  silver sulfADIAZINE (SILVADENE) 1 % cream Apply 1 application topically daily as needed.     [provider]     Family History  Problem Relation Age of Onset  . Breast cancer Mother   . Pancreatic cancer Mother   . Diabetes Mother   . Heart disease Mother   . Heart disease Father   . COPD Father   . Melanoma Sister   . Stroke Brother   . Colon cancer Neg Hx     Social History   Social History  . Marital status: Married    Spouse name: N/A  . Number of children: 2  . Years of education: N/A   Occupational History  .  retired Retired   Social History Main Topics  . Smoking status: Never Smoker  . Smokeless tobacco: Never Used  . Alcohol use No  . Drug use: No  . Sexual activity: Not on file   Other Topics Concern  . Not on file   Social History Narrative  . No narrative on file   Review of Systems: A 12 point ROS discussed and pertinent positives are indicated in the HPI above.  All other systems are negative.  Review of Systems  Vital Signs: BP 110/67   Pulse 94   Temp 97.7 F (36.5 C) (Oral)   Resp 16   Ht 5\' 11"  (1.803 m)   Wt 162 lb (73.5 kg)   SpO2 93%   BMI 22.59 kg/m   Physical Exam   Targeted exam of the right foot shows interval healing of the ulceration at the base of the first toe.  No sign of infection or new wound.  Palpable PT pulse.  Doppler + signal at the [facility].    Mallampati Score:     Imaging: Dg Chest 2 View  Result Date: 05/31/2017 CLINICAL DATA:  Nonproductive cough. EXAM: CHEST  2 VIEW COMPARISON:  Radiographs of December 17, 2014. FINDINGS: Stable cardiomediastinal silhouette. Minimal bilateral pleural effusions are noted. No pneumothorax is noted. Degenerative changes seen involving both shoulder joints. Mildly increased bibasilar interstitial densities are noted concerning for edema or possibly scarring. IMPRESSION: Mildly increased bibasilar  interstitial densities are noted concerning for edema or possibly scarring, with minimal bilateral pleural effusions. Electronically Signed   By: Marijo Conception, M.D.   On: 05/31/2017 08:45    Labs:  CBC:  Recent Labs  02/07/17 1043  WBC 9.3  HGB 13.8  HCT 42.4  PLT 221    COAGS:  Recent Labs  02/07/17 1043  INR 1.10  APTT 35    BMP:  Recent Labs  02/07/17 1043  NA 140  K 4.4  CL 105  CO2 26  GLUCOSE 89  BUN 27*  CALCIUM 9.5  CREATININE 1.07  GFRNONAA >60  GFRAA >60    LIVER FUNCTION TESTS: No results for input(s): BILITOT, AST, ALT, ALKPHOS, PROT, ALBUMIN in the last 8760 hours.  TUMOR MARKERS: No results for input(s): AFPTM, CEA, CA199, CHROMGRNA in the last 8760 hours.  Assessment and Plan:  Mr Mcniel is 71 yo male presenting for scheduled follow up, status post revascularization of occluded right PT for treatment of non-healing great toe wound.    Since our treatment in March, he has healed the wound, and he has been now discharged from wound care.  He has no current pain.   At this point, single anti-platelet with 81mg  ASA is appropriate.  He will continue to see Dr. Caprice Beaver.  We will plan on scheduling 12 month surveillance non-invasive test.  If he has any problems in the mean-time, such as a new wound, we are happy to see him back in the clinic.     Plan:  - 12 month follow up surveillance non-invasive imaging.  We will not plan on scheduled office appt unless he has a problem.  - I have encouraged him to observe all of his future appointments.    Electronically Signed: Corrie Carey 06/04/2017, 12:40 PM   I spent a total of    25 Minutes in face to face in clinical consultation, greater than 50% of which was counseling/coordinating care for PAD/CLI, right foot wound, now healed, SP right tibial angiogram and revascularization.

## 2017-06-04 NOTE — Telephone Encounter (Signed)
Wife states pt has been having issues with a cough at night that is keeping him up, have seen PCP about that but nothing working. Reports he had issues swallowing his pills this am and has had an esophageal stricture in the past. Also reports some SOB. Pt scheduled to see Ellouise Newer PA 06/07/17@1 :45pm. Pts wife aware of appt.

## 2017-06-05 ENCOUNTER — Telehealth: Payer: Self-pay | Admitting: Internal Medicine

## 2017-06-05 NOTE — Telephone Encounter (Signed)
Wife states pt is having increasing difficulty swallowing. Explained to her that if he was to the point where he could not get his medicines or liquids down she would need to take him to the emergency room. She verbalized understanding.

## 2017-06-06 ENCOUNTER — Inpatient Hospital Stay (HOSPITAL_COMMUNITY)
Admission: EM | Admit: 2017-06-06 | Discharge: 2017-06-09 | DRG: 178 | Disposition: A | Discharge status: Home / Self Care | Payer: PPO | Attending: Internal Medicine | Admitting: Internal Medicine

## 2017-06-06 ENCOUNTER — Encounter (HOSPITAL_COMMUNITY): Payer: Self-pay | Admitting: Nurse Practitioner

## 2017-06-06 ENCOUNTER — Emergency Department (HOSPITAL_COMMUNITY): Payer: PPO

## 2017-06-06 DIAGNOSIS — J9383 Other pneumothorax: Secondary | ICD-10-CM | POA: Diagnosis not present

## 2017-06-06 DIAGNOSIS — Z7952 Long term (current) use of systemic steroids: Secondary | ICD-10-CM

## 2017-06-06 DIAGNOSIS — I739 Peripheral vascular disease, unspecified: Secondary | ICD-10-CM | POA: Diagnosis present

## 2017-06-06 DIAGNOSIS — B941 Sequelae of viral encephalitis: Secondary | ICD-10-CM | POA: Diagnosis not present

## 2017-06-06 DIAGNOSIS — R1310 Dysphagia, unspecified: Secondary | ICD-10-CM | POA: Diagnosis present

## 2017-06-06 DIAGNOSIS — J690 Pneumonitis due to inhalation of food and vomit: Secondary | ICD-10-CM | POA: Diagnosis present

## 2017-06-06 DIAGNOSIS — R1312 Dysphagia, oropharyngeal phase: Secondary | ICD-10-CM | POA: Diagnosis present

## 2017-06-06 DIAGNOSIS — J90 Pleural effusion, not elsewhere classified: Secondary | ICD-10-CM | POA: Diagnosis present

## 2017-06-06 DIAGNOSIS — Z79899 Other long term (current) drug therapy: Secondary | ICD-10-CM | POA: Diagnosis not present

## 2017-06-06 DIAGNOSIS — K219 Gastro-esophageal reflux disease without esophagitis: Secondary | ICD-10-CM | POA: Diagnosis present

## 2017-06-06 DIAGNOSIS — K222 Esophageal obstruction: Secondary | ICD-10-CM | POA: Diagnosis present

## 2017-06-06 DIAGNOSIS — L219 Seborrheic dermatitis, unspecified: Secondary | ICD-10-CM | POA: Diagnosis present

## 2017-06-06 DIAGNOSIS — K449 Diaphragmatic hernia without obstruction or gangrene: Secondary | ICD-10-CM | POA: Diagnosis present

## 2017-06-06 DIAGNOSIS — Z8249 Family history of ischemic heart disease and other diseases of the circulatory system: Secondary | ICD-10-CM

## 2017-06-06 DIAGNOSIS — T18128A Food in esophagus causing other injury, initial encounter: Secondary | ICD-10-CM | POA: Diagnosis not present

## 2017-06-06 DIAGNOSIS — F0390 Unspecified dementia without behavioral disturbance: Secondary | ICD-10-CM | POA: Diagnosis present

## 2017-06-06 DIAGNOSIS — Z888 Allergy status to other drugs, medicaments and biological substances status: Secondary | ICD-10-CM | POA: Diagnosis not present

## 2017-06-06 DIAGNOSIS — M459 Ankylosing spondylitis of unspecified sites in spine: Secondary | ICD-10-CM | POA: Diagnosis present

## 2017-06-06 DIAGNOSIS — M341 CR(E)ST syndrome: Secondary | ICD-10-CM | POA: Diagnosis present

## 2017-06-06 DIAGNOSIS — J449 Chronic obstructive pulmonary disease, unspecified: Secondary | ICD-10-CM | POA: Diagnosis not present

## 2017-06-06 DIAGNOSIS — K224 Dyskinesia of esophagus: Secondary | ICD-10-CM | POA: Diagnosis present

## 2017-06-06 DIAGNOSIS — J918 Pleural effusion in other conditions classified elsewhere: Secondary | ICD-10-CM | POA: Diagnosis not present

## 2017-06-06 DIAGNOSIS — K5730 Diverticulosis of large intestine without perforation or abscess without bleeding: Secondary | ICD-10-CM | POA: Diagnosis present

## 2017-06-06 DIAGNOSIS — M351 Other overlap syndromes: Secondary | ICD-10-CM | POA: Diagnosis present

## 2017-06-06 DIAGNOSIS — K3184 Gastroparesis: Secondary | ICD-10-CM | POA: Diagnosis present

## 2017-06-06 DIAGNOSIS — J189 Pneumonia, unspecified organism: Secondary | ICD-10-CM

## 2017-06-06 DIAGNOSIS — Z7982 Long term (current) use of aspirin: Secondary | ICD-10-CM | POA: Diagnosis not present

## 2017-06-06 DIAGNOSIS — Z833 Family history of diabetes mellitus: Secondary | ICD-10-CM

## 2017-06-06 DIAGNOSIS — Z825 Family history of asthma and other chronic lower respiratory diseases: Secondary | ICD-10-CM

## 2017-06-06 DIAGNOSIS — K2950 Unspecified chronic gastritis without bleeding: Secondary | ICD-10-CM | POA: Diagnosis not present

## 2017-06-06 DIAGNOSIS — E874 Mixed disorder of acid-base balance: Secondary | ICD-10-CM | POA: Diagnosis present

## 2017-06-06 DIAGNOSIS — Z9889 Other specified postprocedural states: Secondary | ICD-10-CM

## 2017-06-06 DIAGNOSIS — K2900 Acute gastritis without bleeding: Secondary | ICD-10-CM | POA: Diagnosis not present

## 2017-06-06 DIAGNOSIS — J939 Pneumothorax, unspecified: Secondary | ICD-10-CM

## 2017-06-06 DIAGNOSIS — G40909 Epilepsy, unspecified, not intractable, without status epilepticus: Secondary | ICD-10-CM

## 2017-06-06 DIAGNOSIS — R0602 Shortness of breath: Secondary | ICD-10-CM | POA: Diagnosis not present

## 2017-06-06 DIAGNOSIS — R05 Cough: Secondary | ICD-10-CM | POA: Diagnosis not present

## 2017-06-06 DIAGNOSIS — K317 Polyp of stomach and duodenum: Secondary | ICD-10-CM | POA: Diagnosis present

## 2017-06-06 DIAGNOSIS — R0600 Dyspnea, unspecified: Secondary | ICD-10-CM

## 2017-06-06 HISTORY — DX: Pneumonitis due to inhalation of food and vomit: J69.0

## 2017-06-06 LAB — COMPREHENSIVE METABOLIC PANEL
ALK PHOS: 59 U/L (ref 38–126)
ALT: 25 U/L (ref 17–63)
AST: 52 U/L — AB (ref 15–41)
Albumin: 3 g/dL — ABNORMAL LOW (ref 3.5–5.0)
Anion gap: 11 (ref 5–15)
BILIRUBIN TOTAL: 0.4 mg/dL (ref 0.3–1.2)
BUN: 20 mg/dL (ref 6–20)
CALCIUM: 8.6 mg/dL — AB (ref 8.9–10.3)
CO2: 18 mmol/L — ABNORMAL LOW (ref 22–32)
CREATININE: 1.02 mg/dL (ref 0.61–1.24)
Chloride: 111 mmol/L (ref 101–111)
GFR calc Af Amer: >60 mL/min (ref >60)
Glucose, Bld: 94 mg/dL (ref 65–99)
Potassium: 3.9 mmol/L (ref 3.5–5.1)
Sodium: 140 mmol/L (ref 135–145)
Total Protein: 6.5 g/dL (ref 6.5–8.1)

## 2017-06-06 LAB — CBC
HEMATOCRIT: 36.3 % — AB (ref 39.0–52.0)
HEMOGLOBIN: 11.7 g/dL — AB (ref 13.0–17.0)
MCH: 27 pg (ref 26.0–34.0)
MCHC: 32.2 g/dL (ref 30.0–36.0)
MCV: 83.8 fL (ref 78.0–100.0)
PLATELETS: 191 10*3/uL (ref 150–400)
RBC: 4.33 MIL/uL (ref 4.22–5.81)
RDW: 16 % — ABNORMAL HIGH (ref 11.5–15.5)
WBC: 8.7 10*3/uL (ref 4.0–10.5)

## 2017-06-06 MED ORDER — CELECOXIB 200 MG PO CAPS
200.0000 mg | ORAL_CAPSULE | Freq: Every day | ORAL | Status: DC
  Administered 2017-06-07 – 2017-06-09 (×3): 200 mg via ORAL
  Filled 2017-06-06 (×3): qty 1

## 2017-06-06 MED ORDER — IOPAMIDOL (ISOVUE-300) INJECTION 61%
INTRAVENOUS | Status: AC
  Administered 2017-06-06: 75 mL
  Filled 2017-06-06: qty 75

## 2017-06-06 MED ORDER — HYDROXYCHLOROQUINE SULFATE 200 MG PO TABS
200.0000 mg | ORAL_TABLET | Freq: Two times a day (BID) | ORAL | Status: DC
  Administered 2017-06-07 – 2017-06-09 (×4): 200 mg via ORAL
  Filled 2017-06-06 (×6): qty 1

## 2017-06-06 MED ORDER — LEVETIRACETAM 750 MG PO TABS
750.0000 mg | ORAL_TABLET | Freq: Every day | ORAL | Status: DC
  Administered 2017-06-06: 750 mg via ORAL
  Filled 2017-06-06: qty 1

## 2017-06-06 MED ORDER — OXCARBAZEPINE 300 MG PO TABS
450.0000 mg | ORAL_TABLET | Freq: Two times a day (BID) | ORAL | Status: DC
  Administered 2017-06-06: 450 mg via ORAL
  Filled 2017-06-06 (×2): qty 2

## 2017-06-06 MED ORDER — ASPIRIN EC 81 MG PO TBEC
81.0000 mg | DELAYED_RELEASE_TABLET | Freq: Every day | ORAL | Status: DC
  Administered 2017-06-07 – 2017-06-09 (×3): 81 mg via ORAL
  Filled 2017-06-06 (×3): qty 1

## 2017-06-06 MED ORDER — SODIUM CHLORIDE 0.9 % IV SOLN
INTRAVENOUS | Status: AC
  Administered 2017-06-06: 22:00:00 via INTRAVENOUS

## 2017-06-06 MED ORDER — OXCARBAZEPINE 300 MG PO TABS
300.0000 mg | ORAL_TABLET | Freq: Every day | ORAL | Status: DC
  Administered 2017-06-07: 300 mg via ORAL
  Filled 2017-06-06: qty 1

## 2017-06-06 MED ORDER — PREDNISONE 5 MG PO TABS
5.0000 mg | ORAL_TABLET | Freq: Every day | ORAL | Status: DC
  Administered 2017-06-07 – 2017-06-09 (×3): 5 mg via ORAL
  Filled 2017-06-06 (×3): qty 1

## 2017-06-06 MED ORDER — SODIUM CHLORIDE 0.9 % IV BOLUS (SEPSIS)
500.0000 mL | Freq: Once | INTRAVENOUS | Status: AC
  Administered 2017-06-06: 500 mL via INTRAVENOUS

## 2017-06-06 MED ORDER — NIFEDIPINE ER 60 MG PO TB24
60.0000 mg | ORAL_TABLET | Freq: Every evening | ORAL | Status: DC
  Administered 2017-06-06 – 2017-06-08 (×3): 60 mg via ORAL
  Filled 2017-06-06 (×4): qty 1

## 2017-06-06 MED ORDER — LEVETIRACETAM 250 MG PO TABS: 250.0000 mg | ORAL_TABLET | Freq: Every day | ORAL | Status: DC

## 2017-06-06 MED ORDER — ENOXAPARIN SODIUM 40 MG/0.4ML ~~LOC~~ SOLN
40.0000 mg | SUBCUTANEOUS | Status: DC
  Administered 2017-06-06 – 2017-06-08 (×3): 40 mg via SUBCUTANEOUS
  Filled 2017-06-06 (×3): qty 0.4

## 2017-06-06 MED ORDER — PANTOPRAZOLE SODIUM 40 MG PO TBEC
40.0000 mg | DELAYED_RELEASE_TABLET | Freq: Every day | ORAL | Status: DC
  Administered 2017-06-07 – 2017-06-09 (×3): 40 mg via ORAL
  Filled 2017-06-06 (×3): qty 1

## 2017-06-06 MED ORDER — LEVETIRACETAM 500 MG PO TABS
500.0000 mg | ORAL_TABLET | Freq: Every day | ORAL | Status: DC
  Administered 2017-06-07: 500 mg via ORAL
  Filled 2017-06-06: qty 1

## 2017-06-06 MED ORDER — ACETAMINOPHEN 500 MG PO TABS: 500.0000 mg | ORAL_TABLET | Freq: Four times a day (QID) | ORAL | Status: DC | PRN

## 2017-06-06 NOTE — ED Provider Notes (Signed)
[facility] DEPT Provider Note   CSN: [id number] Arrival date & time: 06/06/17  1306     History   Chief Complaint Chief Complaint  Patient presents with  . Dysphagia    HPI Robert Carey is a 71 y.o. male.  HPI  Patient, with past medical history of seizures, ankylosing spondylitis, connective tissue disease, prior esophageal stricture, presents to ED for evaluation of trouble swallowing for the past 10 days. His wife says that he has had episodes of choking and shortness of breath with cough. States that he was seen by his PCP last week and chest x-ray was obtained that was inconclusive. Patient was put on antibiotics (Augmentin) for possible aspiration pneumonia. Patient reports that he is having the most trouble with swallowing solid foods, especially his medications. Wife states that he has been having some regurgitation but that he has been successful in swallowing his medications. He denies any liquid dysphagia.  States that he has had 2 esophageal stretching procedures done in 2001 and 2005 for esophageal strictures. He denies any chest pain, hemoptysis, changes in appetite, fevers, chills, abdominal pain.  Past Medical History:  Diagnosis Date  . Arthritis   . AS (ankylosing spondylitis) (HCC)   . Cataract   . CREST syndrome ([place])   . Dementia   . Diverticulosis of colon (without mention of hemorrhage)   . Esophageal reflux   . Gastroparesis   . Hiatal hernia   . Mixed connective tissue disease ([place])   . Motility disorder, esophageal   . Raynauds phenomenon   . Scleroderma ([place])   . Seizure disorder ([place])   . Seizures ([employer])    07/2016  . Skin ulcers of foot, bilateral ([place]) 01/03/2017   R foot only  . Spondylitis, ankylosing ([place])   . Stricture and stenosis of esophagus     Patient Active Problem List   Diagnosis Date Noted  . Peripheral arterial disease ([place]) 03/26/2017  . Presbycusis of both ears 05/31/2016  . Seizures ([place]) 01/10/2016  . Cervical  pain 11/18/2012  . Ankylosing spondylitis ([place]) 11/18/2012  . Memory impairment 10/29/2012  . Partial epilepsy ([place]) 10/29/2012  . Esophageal motility disorder 01/22/2012  . GERD (gastroesophageal reflux disease) 01/22/2012  . GERD 03/08/2009  . DEMENTIA 03/07/2009  . RAYNAUD'S SYNDROME 03/07/2009  . ESOPHAGEAL STRICTURE 03/07/2009  . ESOPHAGEAL MOTILITY DISORDER 03/07/2009  . GASTROPARESIS 03/07/2009  . HIATAL HERNIA 03/07/2009  . DIVERTICULOSIS, COLON 03/07/2009  . SCLERODERMA 03/07/2009  . CREST SYNDROME 03/07/2009  . Raynaud's phenomenon 03/07/2009    Past Surgical History:  Procedure Laterality Date  . IR GENERIC HISTORICAL  02/07/2017   IR TIB-PERO ART UNI PTA EA ADD VESSEL MOD SED 02/07/2017 Corrie Mckusick, DO [facility]  . IR GENERIC HISTORICAL  02/07/2017   IR US GUIDE VASC ACCESS LEFT 02/07/2017 Corrie Mckusick, DO [facility]  . IR GENERIC HISTORICAL  02/07/2017   IR TIB-PERO ART PTA MOD SED 02/07/2017 Corrie Mckusick, DO [facility]  . IR GENERIC HISTORICAL  02/07/2017   IR ANGIOGRAM SELECTIVE EACH ADDITIONAL VESSEL 02/07/2017 Corrie Mckusick, DO [facility]  . IR GENERIC HISTORICAL  02/07/2017   IR ANGIOGRAM EXTREMITY RIGHT 02/07/2017 Corrie Mckusick, DO [facility]  . IR GENERIC HISTORICAL  02/07/2017   IR THROMBECT PRIM MECH INIT (INCLU) MOD SED 02/07/2017 Corrie Mckusick, DO [facility]  . IR RADIOLOGIST EVAL & MGMT  02/27/2017  . IR RADIOLOGIST EVAL & MGMT  01/22/2017  . NOSE SURGERY  2005  . THUMB ARTHROSCOPY  Left   . TOE SURGERY     right great toe  . WISDOM TOOTH EXTRACTION         Home Medications    Prior to Admission medications   Medication Sig Start Date End Date Taking? Authorizing Provider  acetaminophen (TYLENOL) 500 MG tablet Take 500 mg by mouth every 6 (six) hours as needed.   Yes [provider]  amoxicillin-clavulanate (AUGMENTIN) 875-125 MG tablet Take 1 tablet by mouth 2 (two) times daily. HAS 1 DOSE LEFT 05/30/17  Yes [provider]   aspirin EC 81 MG tablet Take 81 mg by mouth daily.   Yes [provider]  Calcium Carbonate-Vitamin D (CALCARB 600/D PO) Take 1 tablet by mouth daily.    Yes [provider]  celecoxib (CELEBREX) 200 MG capsule Take 200 mg by mouth daily.   Yes [provider]  clobetasol cream (TEMOVATE) 6.19 % Apply 1 application topically 2 (two) times daily as needed (ECZEMA).    Yes [provider]  hydroxychloroquine (PLAQUENIL) 200 MG tablet Take 200 mg by mouth 2 (two) times daily with a meal.  04/30/17  Yes [provider]  ketoconazole (NIZORAL) 2 % cream Apply 1 application topically 2 (two) times daily as needed for irritation.    Yes [provider]  levETIRAcetam (KEPPRA) 250 MG tablet Take 375-500 mg by mouth See admin instructions. Takes two 750mg  tablets in the morning for a total of 1500mg  daily in the morning, 375 mg at noon, Takes two 250mg  tablets (500 mg) daily in the evening at 7pm, and 750 mg at 9p   Yes [provider]  levETIRAcetam (KEPPRA) 750 MG tablet Take 750-1,500 mg by mouth See admin instructions. Takes two 750mg  tablets in the morning for a total of 1500mg  daily in the morning, 375 mg at noon, Takes two 250mg  tablets (500 mg) daily in the evening at 7pm, and 750 mg at 9p   Yes [provider]  Multiple Vitamin (MULTIVITAMIN) capsule Take 1 capsule by mouth daily.   Yes [provider]  NIFEdipine (PROCARDIA XL/ADALAT-CC) 60 MG 24 hr tablet Take 60 mg by mouth every evening.    Yes [provider]  NONFORMULARY OR COMPOUNDED ITEM Apply 1 application topically 2 (two) times daily as needed. Apply to neck twice daily as needed. Meloxicam, lidocaine, topiramate, prilocaine   Yes [provider]  NONFORMULARY OR COMPOUNDED ITEM Apply 1 application topically 4 (four) times daily as needed. Apply to right foot as needed. Ketoconazole, diclofenac, baclofen, lidocaine, gabapentin   Yes [provider]  omeprazole (PRILOSEC) 40 MG capsule TAKE (1) CAPSULE BY MOUTH ONCE DAILY. Patient taking differently: TAKE 40MG  BY MOUTH ONCE DAILY. 05/27/17  Yes Irene Shipper, MD  OXcarbazepine (TRILEPTAL) 150 MG tablet TAKE 300MG  IN THE MORNING AND 450MG  IN THE EVENING 04/10/17  Yes [provider]  predniSONE (DELTASONE) 5 MG tablet Take 5 mg by mouth daily.   Yes [provider]  Probiotic Product (PROBIOTIC PO) Take 1 tablet by mouth daily.   Yes [provider]  silver sulfADIAZINE (SILVADENE) 1 % cream Apply 1 application topically daily as needed.    Yes [provider]  Na Sulfate-K Sulfate-Mg Sulf 17.5-3.13-1.6 GM/180ML SOLN Suprep as directed.  No substitutions. Patient not taking: Reported on 06/04/2017 03/11/17   Irene Shipper, MD  predniSONE (STERAPRED UNI-PAK 48 TAB) 5 MG (48) TBPK tablet Take 1 tablet by mouth as directed. 03/25/17   [provider]    Family History Family History  Problem Relation Age of Onset  . Breast cancer Mother   . Pancreatic cancer Mother   . Diabetes Mother   . Heart disease Mother   . Heart disease Father   . COPD Father   . Melanoma Sister   . Stroke Brother   . Colon cancer Neg Hx     Social History Social History  Substance Use Topics  . Smoking status: Never Smoker  . Smokeless tobacco: Never Used  . Alcohol use No     Allergies   Dilantin [phenytoin sodium extended] and Phenytoin sodium extended   Review of Systems Review of Systems  Constitutional: Negative for appetite change, chills and fever.  HENT: Negative for ear pain, rhinorrhea, sneezing and sore throat.   Eyes: Negative for photophobia and visual disturbance.  Respiratory: Positive for cough, choking and shortness of breath. Negative for chest tightness and wheezing.   Cardiovascular: Negative for chest pain and palpitations.  Gastrointestinal: Negative for abdominal pain, blood in stool, constipation, diarrhea, nausea and  vomiting.  Genitourinary: Negative for dysuria, hematuria and urgency.  Musculoskeletal: Negative for myalgias.  Skin: Negative for rash.  Neurological: Negative for dizziness, weakness and light-headedness.     Physical Exam Updated Vital Signs BP 124/69 (BP Location: Right Arm)   Pulse 96   Temp 98 F (36.7 C) (Oral)   Resp (!) 21   SpO2 98%   Physical Exam  Constitutional: He appears well-developed and well-nourished. No distress.  Tolerating secretions.  HENT:  Head: Normocephalic and atraumatic.  Nose: Nose normal.  Eyes: Conjunctivae and EOM are normal. Left eye exhibits no discharge. No scleral icterus.  Neck: Normal range of motion. Neck supple.  Cardiovascular: Normal rate, regular rhythm, normal heart sounds and intact distal pulses.  Exam reveals no gallop and no friction rub.   No murmur heard. Pulmonary/Chest: Effort normal and breath sounds normal. No respiratory distress.  Abdominal: Soft. Bowel sounds are normal. He exhibits no distension. There is no tenderness. There is no guarding.  Able to drink water without difficulty.  Musculoskeletal: Normal range of motion. He exhibits no edema.  Neurological: He is alert. No cranial nerve deficit or sensory deficit. He exhibits normal muscle tone.  Skin: Skin is warm and dry. No rash noted.  Psychiatric: He has a normal mood and affect.  Nursing note and vitals reviewed.    ED Treatments / Results  Labs (all labs ordered are listed, but only abnormal results are displayed) Labs Reviewed  CBC - Abnormal; Notable for the following:       Result Value   Hemoglobin 11.7 (*)    HCT 36.3 (*)    RDW 16.0 (*)    All other components within normal limits  COMPREHENSIVE METABOLIC PANEL - Abnormal; Notable for the following:    CO2 18 (*)    Calcium 8.6 (*)    Albumin 3.0 (*)    AST 52 (*)    All other components within normal limits    EKG  EKG Interpretation None       Radiology Ct Chest W  Contrast  Result Date: 06/06/2017 CLINICAL DATA:  Cough for weeks. EXAM: CT CHEST WITH CONTRAST TECHNIQUE: Multidetector CT imaging of the chest was performed during intravenous contrast administration. CONTRAST:  75 cc Isovue 300 COMPARISON:  None. FINDINGS: Cardiovascular: The heart is enlarged. Coronary artery calcification is noted. Atherosclerotic calcification is noted in the wall of the thoracic aorta. Mediastinum/Nodes:  11 mm short axis subcarinal lymph node is associated with a 13 mm lymph node in the right hilum. The esophagus is diffusely patulous and filled with fluid and debris. Lungs/Pleura: Interlobular septal thickening associated with central ground-glass opacity in the right upper lobes. There is patchy nodular airspace disease in the central right middle lobe with bibasilar collapse/consolidation and small bilateral pleural effusions. Upper Abdomen: Small nonobstructing stone noted right kidney. Musculoskeletal: Bone windows reveal no worrisome lytic or sclerotic osseous lesions. IMPRESSION: 1. Patchy bilateral airspace disease with bilateral upper lobe mosaic attenuation. Multifocal pneumonia likely with potential edema or hemorrhage in the upper lobes. 2. Bilateral pleural effusions, right greater than left. 3. Diffusely patulous esophagus filled with fluid and debris. 4. Mediastinal and right hilar lymphadenopathy, likely reactive. 5. Follow-up CT imaging recommended to ensure resolution of findings. Electronically Signed   By: Misty Stanley M.D.   On: 06/06/2017 17:45    Procedures Procedures (including critical care time)  Medications Ordered in ED Medications  sodium chloride 0.9 % bolus 500 mL (0 mLs Intravenous Stopped 06/06/17 1657)  iopamidol (ISOVUE-300) 61 % injection (75 mLs  Contrast Given 06/06/17 1708)     Initial Impression / Assessment and Plan / ED Course  I have reviewed the triage vital signs and the nursing notes.  Pertinent labs & imaging results that were  available during my care of the patient were reviewed by me and considered in my medical decision making (see chart for details).     Patient presents to ED for evaluation of trouble swallowing for the past 10 days. Wife states that patient has had progressive worsening in swallowing that started with solid foods and is now moved onto liquids. He has a history of esophageal stricture 2 in the past for which she has received dilatation for the most recent being in 2005. She states that he is also been having a dry cough and several choking episodes. He was scheduled for CT scan tomorrow as well as GI appointment tomorrow but he was sent here due to worsening of his symptoms. Patient has been on Augmentin for possible aspiration pneumonia as prescribed by his PCP. On physical exam he is tolerating secretions and is able to swallow water here in the ED without difficulty. CBC is unremarkable. CMP with a low bicarb at 18. It appears that patient could be volume depleted based on his symptoms. Patient will need to be admitted for is volume depletion and medications. Spoke to [facility] who will see patient tomorrow for further workup. They advise that we make patient NPO today.  Patient discussed with and seen by Dr. Ashok Cordia.   Final Clinical Impressions(s) / ED Diagnoses   Final diagnoses:  Dysphagia, unspecified type    New Prescriptions New Prescriptions   No medications on file     Delia Heady, Hershal Coria 06/06/17 1820    Lajean Saver, MD 06/07/17 2057

## 2017-06-06 NOTE — ED Triage Notes (Addendum)
Pt presents with wife for c/o trouble swallowing. He has had increased difficulty swallowing for the past ten days. He's had episodes of choking and shortness of breath, and has developed a cough this week. His primary care provider ordered a chest xray last week that was not clear, so he was scheduled for a follow up CT scan tomorrow night. Today he seemed more short of breath and was having trouble swallowing his daily medications so his doctor referred him to emergency department for further evaluation and testing.

## 2017-06-06 NOTE — ED Notes (Signed)
Admitting MD at bedside.

## 2017-06-06 NOTE — H&P (Signed)
Date: 06/06/2017               Patient Name:  Robert Carey MRN: [id number]  DOB: Apr 04, 1946 Age / Sex: 71 y.o., male   PCP: Asencion Noble, MD         Medical Service: Internal Medicine Teaching Service         Attending Physician: Dr. Lajean Saver, MD    First Contact: Dr. Maricela Bo Pager: [phone]  Second Contact: Dr. Benjamine Mola Pager: [phone]       After Hours (After 5p/  First Contact Pager: [phone]  weekends / holidays): Second Contact Pager: [phone]   Chief Complaint: Difficulty eating, cough, shortness of breath  History of Present Illness:  Robert Carey is a 71 y.o m with pmh of seizure disorder, prior esophageal strictures, ankylosing spondylitis, crest syndrome, and gastroparesis who presented with shortness of breath. The shortness of breath has been present for the past week which has made it difficult for him to do daily activities. He has had accompanied cough that is especially prevalent at nighttime and has been worsening over the past week. He has tried to use hydrocodone cough medication which hasn't helped. The patient also has had difficulty eating and swallowing food and has had nausea. He has been able to have liquids. He has not gained significant weight to his knowledge. He denies vomiting and chest pain. In the past he has had surgeries for esophageal strictures in 2001 and 2005.He was seen by his pcp a week ago and prescribed augmentin for possible augmentin for which he has one dose remaining.   Meds:  Current Meds  Medication Sig  . acetaminophen (TYLENOL) 500 MG tablet Take 500 mg by mouth every 6 (six) hours as needed.  Marland Kitchen amoxicillin-clavulanate (AUGMENTIN) 875-125 MG tablet Take 1 tablet by mouth 2 (two) times daily. HAS 1 DOSE LEFT  . aspirin EC 81 MG tablet Take 81 mg by mouth daily.  . Calcium Carbonate-Vitamin D (CALCARB 600/D PO) Take 1 tablet by mouth daily.   . celecoxib (CELEBREX) 200 MG capsule Take 200 mg by mouth daily.  . clobetasol cream  (TEMOVATE) 8.31 % Apply 1 application topically 2 (two) times daily as needed (ECZEMA).   . hydroxychloroquine (PLAQUENIL) 200 MG tablet Take 200 mg by mouth 2 (two) times daily with a meal.   . ketoconazole (NIZORAL) 2 % cream Apply 1 application topically 2 (two) times daily as needed for irritation.   . levETIRAcetam (KEPPRA) 250 MG tablet Take 375-500 mg by mouth See admin instructions. Takes two 750mg  tablets in the morning for a total of 1500mg  daily in the morning, 375 mg at noon, Takes two 250mg  tablets (500 mg) daily in the evening at 7pm, and 750 mg at 9p  . levETIRAcetam (KEPPRA) 750 MG tablet Take 750-1,500 mg by mouth See admin instructions. Takes two 750mg  tablets in the morning for a total of 1500mg  daily in the morning, 375 mg at noon, Takes two 250mg  tablets (500 mg) daily in the evening at 7pm, and 750 mg at 9p  . Multiple Vitamin (MULTIVITAMIN) capsule Take 1 capsule by mouth daily.  Marland Kitchen NIFEdipine (PROCARDIA XL/ADALAT-CC) 60 MG 24 hr tablet Take 60 mg by mouth every evening.   . NONFORMULARY OR COMPOUNDED ITEM Apply 1 application topically 2 (two) times daily as needed. Apply to neck twice daily as needed. Meloxicam, lidocaine, topiramate, prilocaine  . NONFORMULARY OR COMPOUNDED ITEM Apply 1 application topically 4 (four) times daily as needed.  Apply to right foot as needed. Ketoconazole, diclofenac, baclofen, lidocaine, gabapentin  . omeprazole (PRILOSEC) 40 MG capsule TAKE (1) CAPSULE BY MOUTH ONCE DAILY. (Patient taking differently: TAKE 40MG  BY MOUTH ONCE DAILY.)  . OXcarbazepine (TRILEPTAL) 150 MG tablet TAKE 300MG  IN THE MORNING AND 450MG  IN THE EVENING  . predniSONE (DELTASONE) 5 MG tablet Take 5 mg by mouth daily.  . Probiotic Product (PROBIOTIC PO) Take 1 tablet by mouth daily.  . silver sulfADIAZINE (SILVADENE) 1 % cream Apply 1 application topically daily as needed.      Allergies: Allergies as of 06/06/2017 - Review Complete 06/06/2017  Allergen Reaction Noted  .  Dilantin [phenytoin sodium extended] Hives 03/21/2015  . Phenytoin sodium extended Rash 01/21/2012   Past Medical History:  Diagnosis Date  . Arthritis   . AS (ankylosing spondylitis) (HCC)   . Cataract   . CREST syndrome ([place])   . Dementia   . Diverticulosis of colon (without mention of hemorrhage)   . Esophageal reflux   . Gastroparesis   . Hiatal hernia   . Mixed connective tissue disease ([place])   . Motility disorder, esophageal   . Raynauds phenomenon   . Scleroderma ([place])   . Seizure disorder ([place])   . Seizures ([place])    07/2016  . Skin ulcers of foot, bilateral ([place]) 01/03/2017   R foot only  . Spondylitis, ankylosing ([place])   . Stricture and stenosis of esophagus     Family History:   Father-heart disease and MI in 16's, COPD (died in 64s from this) Mother-pancreatic cancer, MI Sister-Died of melanoma  Social History: Former Chief Executive Officer, now retired. Never smoker, no etoh, no drug. Married 2 children  Review of Systems: A complete ROS was negative except as per HPI.   Physical Exam: Blood pressure 115/75, pulse 93, temperature 98 F (36.7 C), temperature source Oral, resp. rate 15, SpO2 96 %.  Physical Exam  Constitutional: He is well-developed, well-nourished, and in no distress. No distress.  HENT:  Head: Normocephalic and atraumatic.  Cardiovascular: Normal rate, regular rhythm, normal heart sounds and intact distal pulses.   Pulmonary/Chest: Breath sounds normal. Tachypnea noted. He is in respiratory distress.  Abdominal: Soft. Bowel sounds are normal. There is no tenderness.  Musculoskeletal: He exhibits deformity (swan neck deformity on bilateral hands).  stiffness  Skin: Rash noted. There is erythema.  Crusting, erythematous rash on the trunk  Psychiatric: Mood, memory, affect and judgment normal.    CT Chest: 1. Patchy bilateral airspace disease with bilateral upper lobe mosaic attenuation. Multifocal pneumonia likely with potential edema or hemorrhage  in the upper lobes. 2. Bilateral pleural effusions, right greater than left. 3. Diffusely patulous esophagus filled with fluid and debris. 4. Mediastinal and right hilar lymphadenopathy, likely reactive. 5. Follow-up CT imaging recommended to ensure resolution of findings.  Assessment & Plan by Problem:  Dysphagia Bilateral Pleural Effusion Aspiration Pneumonia The patient's dysphagia to solid food is likely to be secondary to esophageal strictures. Per Ct chest his esophagus is filled with fluid and debris. There is also presence of bilateral pleural effusion which might be due to the esophageal strictures causing aspiration pneumonia. 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 Physical exam findings could visualize sclerodactaly and it is likely that  the patient has another esophageal stricture.  Continue with celecoxib 200mg  qd, plaquenil 200mg  bid, nifedipine 60mg , and prednisone 5mg   Seizure disorder Patient requested to continue taking his home keppa, oxcarbazepine.    Dispo: Admit patient to Inpatient with expected length of stay greater than 2 midnights.  Signed: Lars Mage, MD Internal Medicine PGY1 Pager:[phone] 06/06/2017, 7:04 PM

## 2017-06-06 NOTE — H&P (Signed)
Date: 06/06/2017               Patient Name:  Robert Carey MRN: [id number]  DOB: 09-Jun-1946 Age / Sex: 71 y.o., male   PCP: Asencion Noble, MD              Medical Service: Internal Medicine Teaching Service              Attending Physician: Dr. Lucious Groves, DO    First Contact: Lucendia Herrlich, MS III Pager: [phone]  Second Contact: Dr. Maricela Bo Pager: [phone]  Third Contact Dr. Benjamine Mola Pager: [phone]       After Hours (After 5p/  First Contact Pager: [phone]  weekends / holidays): Second Contact Pager: [phone]   Chief Complaint: dysphagia, cough, SOB  History of Present Illness: Robert Carey is a 71yo M with PMH of ankylosing spondylitis, mixed connective tissue disease, prior esophageal stricture and gastroparesis who presents for evaluation of difficulty swallowing, worsening dry cough and SOB for the past week. His wife reports that he has episodes of coughing or choking usually at night and with meals. His cough is worse when he is lying flat. His SOB is present even when he is sitting and resting. He does not feel he can walk farther than 25 feet without SOB. He has difficulty swallowing solid foods but no difficulty with liquids. He has been able to take his oral medications, however. Pt denies chest pain, abdominal pain, sputum, vomiting, nausea.   Patient reports seeing his PCP last week for these symptoms. He was put on Augmentin (last day of course tomorrow) for suspected aspiration pneumonia. Symptoms have worsened since, however.   Patient requests to continue his home oral Keppra and Trileptal medication. This was okay per team.   Meds: No current facility-administered medications for this encounter.    Current Outpatient Prescriptions  Medication Sig Dispense Refill  . acetaminophen (TYLENOL) 500 MG tablet Take 500 mg by mouth every 6 (six) hours as needed.    Marland Kitchen amoxicillin-clavulanate (AUGMENTIN) 875-125 MG tablet Take 1 tablet by mouth 2 (two) times daily. HAS 1 DOSE  LEFT    . aspirin EC 81 MG tablet Take 81 mg by mouth daily.    . Calcium Carbonate-Vitamin D (CALCARB 600/D PO) Take 1 tablet by mouth daily.     . celecoxib (CELEBREX) 200 MG capsule Take 200 mg by mouth daily.    . clobetasol cream (TEMOVATE) 4.16 % Apply 1 application topically 2 (two) times daily as needed (ECZEMA).     . hydroxychloroquine (PLAQUENIL) 200 MG tablet Take 200 mg by mouth 2 (two) times daily with a meal.     . ketoconazole (NIZORAL) 2 % cream Apply 1 application topically 2 (two) times daily as needed for irritation.     . levETIRAcetam (KEPPRA) 250 MG tablet Take 375-500 mg by mouth See admin instructions. Takes two 765m tablets in the morning for a total of 15068mdaily in the morning, 375 mg at noon, Takes two 25031mablets (500 mg) daily in the evening at 7pm, and 750 mg at 9p    . levETIRAcetam (KEPPRA) 750 MG tablet Take 750-1,500 mg by mouth See admin instructions. Takes two 750m53mblets in the morning for a total of 1500mg81mly in the morning, 375 mg at noon, Takes two 250mg 24mets (500 mg) daily in the evening at 7pm, and 750 mg at 9p    . Multiple Vitamin (MULTIVITAMIN) capsule Take 1 capsule by mouth daily.    .Marland Kitchen  NIFEdipine (PROCARDIA XL/ADALAT-CC) 60 MG 24 hr tablet Take 60 mg by mouth every evening.     . NONFORMULARY OR COMPOUNDED ITEM Apply 1 application topically 2 (two) times daily as needed. Apply to neck twice daily as needed. Meloxicam, lidocaine, topiramate, prilocaine    . NONFORMULARY OR COMPOUNDED ITEM Apply 1 application topically 4 (four) times daily as needed. Apply to right foot as needed. Ketoconazole, diclofenac, baclofen, lidocaine, gabapentin    . omeprazole (PRILOSEC) 40 MG capsule TAKE (1) CAPSULE BY MOUTH ONCE DAILY. (Patient taking differently: TAKE 40MG BY MOUTH ONCE DAILY.) 30 capsule 3  . OXcarbazepine (TRILEPTAL) 150 MG tablet TAKE 300MG IN THE MORNING AND 450MG IN THE EVENING    . predniSONE (DELTASONE) 5 MG tablet Take 5 mg by mouth  daily.    . Probiotic Product (PROBIOTIC PO) Take 1 tablet by mouth daily.    . silver sulfADIAZINE (SILVADENE) 1 % cream Apply 1 application topically daily as needed.     . Na Sulfate-K Sulfate-Mg Sulf 17.5-3.13-1.6 GM/180ML SOLN Suprep as directed.  No substitutions. (Patient not taking: Reported on 06/04/2017) 354 mL 0  . predniSONE (STERAPRED UNI-PAK 48 TAB) 5 MG (48) TBPK tablet Take 1 tablet by mouth as directed.      Allergies: Allergies as of 06/06/2017 - Review Complete 06/06/2017  Allergen Reaction Noted  . Dilantin [phenytoin sodium extended] Hives 03/21/2015  . Phenytoin sodium extended Rash 01/21/2012   Past Medical History:  Diagnosis Date  . Arthritis   . AS (ankylosing spondylitis) (HCC)   . Cataract   . CREST syndrome ([place])   . Dementia   . Diverticulosis of colon (without mention of hemorrhage)   . Esophageal reflux   . Gastroparesis   . Hiatal hernia   . Mixed connective tissue disease ([place])   . Motility disorder, esophageal   . Raynauds phenomenon   . Scleroderma ([place])   . Seizure disorder ([place])   . Seizures ([place])    07/2016  . Skin ulcers of foot, bilateral ([place]) 01/03/2017   R foot only  . Spondylitis, ankylosing ([place])   . Stricture and stenosis of esophagus    Past Surgical History:  Procedure Laterality Date  . IR GENERIC HISTORICAL  02/07/2017   IR TIB-PERO ART UNI PTA EA ADD VESSEL MOD SED 02/07/2017 Corrie Mckusick, DO [facility]  . IR GENERIC HISTORICAL  02/07/2017   IR US GUIDE VASC ACCESS LEFT 02/07/2017 Corrie Mckusick, DO [facility]  . IR GENERIC HISTORICAL  02/07/2017   IR TIB-PERO ART PTA MOD SED 02/07/2017 Corrie Mckusick, DO [facility]  . IR GENERIC HISTORICAL  02/07/2017   IR ANGIOGRAM SELECTIVE EACH ADDITIONAL VESSEL 02/07/2017 Corrie Mckusick, DO [facility]  . IR GENERIC HISTORICAL  02/07/2017   IR ANGIOGRAM EXTREMITY RIGHT 02/07/2017 Corrie Mckusick, DO [facility]  . IR GENERIC HISTORICAL  02/07/2017   IR THROMBECT PRIM MECH INIT ([facility])  MOD SED 02/07/2017 Corrie Mckusick, DO [facility]  . IR RADIOLOGIST EVAL & MGMT  02/27/2017  . IR RADIOLOGIST EVAL & MGMT  01/22/2017  . NOSE SURGERY  2005  . THUMB ARTHROSCOPY Left   . TOE SURGERY     right great toe  . WISDOM TOOTH EXTRACTION     Family History  Problem Relation Age of Onset  . Breast cancer Mother   . Pancreatic cancer Mother   . Diabetes Mother   . Heart disease Mother   . Heart disease Father   . COPD Father   .  Melanoma Sister   . Stroke Brother   . Colon cancer Neg Hx    Social History   Social History  . Marital status: Married    Spouse name: N/A  . Number of children: 2  . Years of education: N/A   Occupational History  . retired Retired   Social History Main Topics  . Smoking status: Never Smoker  . Smokeless tobacco: Never Used  . Alcohol use No  . Drug use: No  . Sexual activity: Not on file   Other Topics Concern  . Not on file   Social History Narrative  . No narrative on file    Review of Systems: Pertinent items are noted in HPI.  Physical Exam: Blood pressure (!) 121/99, pulse 97, temperature 98 F (36.7 C), temperature source Oral, resp. rate (!) 28, SpO2 93 %. BP (!) 121/99   Pulse 97   Temp 98 F (36.7 C) (Oral)   Resp (!) 28   SpO2 93%  General appearance: alert, cooperative and appears stated age Lungs: Breath sounds normal. Tachypnea and respiratory distress noted. Chest wall: no tenderness Heart: regular rate and rhythm, S1, S2 normal, no murmur, click, rub or gallop Abdomen: soft, non-tender; bowel sounds normal; no masses,  no organomegaly Extremities: extremities atraumatic, no cyanosis or edema. Hands with bilateral swan neck deformity and ulnar deviation, stiffness Skin: Crusty, erythematous rash on back. Neuro: Grossly normal.  Lab results: Hg/HCT 11.7/36.3 PLT 191 Na 140 K 3.9 Cl 111 CO2 18 Glu 94 BUN 20 Cr 1.02 Ca 8.6 Albumin 3.0 AST/ALT 52/25 Alk phos 59 Bili 0.4 Imaging results:  Ct Chest W  Contrast  Result Date: 06/06/2017 CLINICAL DATA:  Cough for weeks. EXAM: CT CHEST WITH CONTRAST TECHNIQUE: Multidetector CT imaging of the chest was performed during intravenous contrast administration. CONTRAST:  75 cc Isovue 300 COMPARISON:  None. FINDINGS: Cardiovascular: The heart is enlarged. Coronary artery calcification is noted. Atherosclerotic calcification is noted in the wall of the thoracic aorta. Mediastinum/Nodes: 11 mm short axis subcarinal lymph node is associated with a 13 mm lymph node in the right hilum. The esophagus is diffusely patulous and filled with fluid and debris. Lungs/Pleura: Interlobular septal thickening associated with central ground-glass opacity in the right upper lobes. There is patchy nodular airspace disease in the central right middle lobe with bibasilar collapse/consolidation and small bilateral pleural effusions. Upper Abdomen: Small nonobstructing stone noted right kidney. Musculoskeletal: Bone windows reveal no worrisome lytic or sclerotic osseous lesions. IMPRESSION: 1. Patchy bilateral airspace disease with bilateral upper lobe mosaic attenuation. Multifocal pneumonia likely with potential edema or hemorrhage in the upper lobes. 2. Bilateral pleural effusions, right greater than left. 3. Diffusely patulous esophagus filled with fluid and debris. 4. Mediastinal and right hilar lymphadenopathy, likely reactive. 5. Follow-up CT imaging recommended to ensure resolution of findings. Electronically Signed   By: Misty Stanley M.D.   On: 06/06/2017 17:45    Assessment & Plan by Problem: Active Problems:   Aspiration pneumonia (HCC)  Aspiration pneumonia Bilateral Pleural Effusion Likely 2/2 esophageal stricture. Bicarb of 18 explained by pt's respiratory alkalosis with compensatory metabolic acidosis.  -CXR in AM -Repeat CBC and BMP in AM -Continue Augmentin  Mixed connective tissue disease Esophageal dysmotility/dysphagia CT shows esophagus filled with fluid  and debris, suggesting dysmotility 2/2 pt's scleroderma. This could be leading to pt's dysphagia, aspiration, SOB. -Consult GI -NPO for possible procedure -Continue prednisone 68m daily, celecoxib 2039mqd, plaquenil 20063mid, nifedipine 34m74meizure disorder Continue  home keppra and oxcarbazepine.  Dispo: Expected length of stay greater than 2 nights.  This is a Careers information officer Note.  The care of the patient was discussed with Dr. Heber [place] and the assessment and plan was formulated with their assistance.  Please see their note for official documentation of the patient encounter.   Signed: Murlean Caller, Medical Student 06/06/2017, 8:21 PM  Pager: [phone]

## 2017-06-06 NOTE — ED Notes (Signed)
Patient transported to CT 

## 2017-06-06 NOTE — ED Notes (Signed)
Admitting MD stated okay for patient to take home medication 500mg  Keppra to stay on schedule - pt taking at this time.

## 2017-06-07 ENCOUNTER — Observation Stay (HOSPITAL_COMMUNITY): Payer: PPO

## 2017-06-07 ENCOUNTER — Observation Stay (HOSPITAL_COMMUNITY): Payer: PPO | Admitting: Anesthesiology

## 2017-06-07 ENCOUNTER — Ambulatory Visit (HOSPITAL_COMMUNITY): Payer: PPO

## 2017-06-07 ENCOUNTER — Ambulatory Visit: Payer: Self-pay | Admitting: Physician Assistant

## 2017-06-07 ENCOUNTER — Encounter (HOSPITAL_COMMUNITY): Payer: Self-pay

## 2017-06-07 ENCOUNTER — Encounter (HOSPITAL_COMMUNITY): Payer: Self-pay | Admitting: General Practice

## 2017-06-07 ENCOUNTER — Encounter (HOSPITAL_COMMUNITY)
Admission: EM | Disposition: A | Discharge status: Home / Self Care | Payer: Self-pay | Source: Home / Self Care | Attending: Internal Medicine

## 2017-06-07 DIAGNOSIS — K224 Dyskinesia of esophagus: Secondary | ICD-10-CM | POA: Diagnosis not present

## 2017-06-07 DIAGNOSIS — T18128A Food in esophagus causing other injury, initial encounter: Secondary | ICD-10-CM | POA: Diagnosis not present

## 2017-06-07 DIAGNOSIS — K2950 Unspecified chronic gastritis without bleeding: Secondary | ICD-10-CM | POA: Diagnosis not present

## 2017-06-07 DIAGNOSIS — K317 Polyp of stomach and duodenum: Secondary | ICD-10-CM | POA: Diagnosis not present

## 2017-06-07 DIAGNOSIS — J90 Pleural effusion, not elsewhere classified: Secondary | ICD-10-CM | POA: Diagnosis not present

## 2017-06-07 DIAGNOSIS — R1310 Dysphagia, unspecified: Secondary | ICD-10-CM | POA: Diagnosis not present

## 2017-06-07 DIAGNOSIS — K2900 Acute gastritis without bleeding: Secondary | ICD-10-CM | POA: Diagnosis not present

## 2017-06-07 DIAGNOSIS — R05 Cough: Secondary | ICD-10-CM | POA: Diagnosis not present

## 2017-06-07 DIAGNOSIS — R0602 Shortness of breath: Secondary | ICD-10-CM | POA: Diagnosis not present

## 2017-06-07 DIAGNOSIS — J690 Pneumonitis due to inhalation of food and vomit: Secondary | ICD-10-CM | POA: Diagnosis not present

## 2017-06-07 DIAGNOSIS — J939 Pneumothorax, unspecified: Secondary | ICD-10-CM | POA: Diagnosis not present

## 2017-06-07 HISTORY — PX: IR THORACENTESIS ASP PLEURAL SPACE W/IMG GUIDE: IMG5380

## 2017-06-07 HISTORY — PX: SAVORY DILATION: SHX5439

## 2017-06-07 HISTORY — PX: ESOPHAGOGASTRODUODENOSCOPY (EGD) WITH PROPOFOL: SHX5813

## 2017-06-07 LAB — GRAM STAIN

## 2017-06-07 LAB — CBC
HEMATOCRIT: 36.6 % — AB (ref 39.0–52.0)
HEMOGLOBIN: 11.7 g/dL — AB (ref 13.0–17.0)
MCH: 27.1 pg (ref 26.0–34.0)
MCHC: 32 g/dL (ref 30.0–36.0)
MCV: 84.7 fL (ref 78.0–100.0)
Platelets: 183 10*3/uL (ref 150–400)
RBC: 4.32 MIL/uL (ref 4.22–5.81)
RDW: 16.1 % — ABNORMAL HIGH (ref 11.5–15.5)
WBC: 8.7 10*3/uL (ref 4.0–10.5)

## 2017-06-07 LAB — BASIC METABOLIC PANEL
Anion gap: 13 (ref 5–15)
BUN: 17 mg/dL (ref 6–20)
CHLORIDE: 113 mmol/L — AB (ref 101–111)
CO2: 16 mmol/L — AB (ref 22–32)
CREATININE: 0.99 mg/dL (ref 0.61–1.24)
Calcium: 8.4 mg/dL — ABNORMAL LOW (ref 8.9–10.3)
GFR calc Af Amer: >60 mL/min (ref >60)
GFR calc non Af Amer: >60 mL/min (ref >60)
GLUCOSE: 65 mg/dL (ref 65–99)
POTASSIUM: 3.5 mmol/L (ref 3.5–5.1)
SODIUM: 142 mmol/L (ref 135–145)

## 2017-06-07 LAB — PROTIME-INR
INR: 1.26
PROTHROMBIN TIME: 15.9 s — AB (ref 11.4–15.2)

## 2017-06-07 LAB — LACTATE DEHYDROGENASE: LDH: 212 U/L — AB (ref 98–192)

## 2017-06-07 LAB — GLUCOSE, CAPILLARY
Glucose-Capillary: 59 mg/dL — ABNORMAL LOW (ref 65–99)
Glucose-Capillary: 101 mg/dL — ABNORMAL HIGH (ref 65–99)

## 2017-06-07 LAB — LACTATE DEHYDROGENASE, PLEURAL OR PERITONEAL FLUID: LD FL: 72 U/L — AB (ref 3–23)

## 2017-06-07 LAB — PROTEIN, PLEURAL OR PERITONEAL FLUID: Total protein, fluid: <3 g/dL

## 2017-06-07 SURGERY — ESOPHAGOGASTRODUODENOSCOPY (EGD) WITH PROPOFOL
Anesthesia: Monitor Anesthesia Care

## 2017-06-07 MED ORDER — LEVETIRACETAM 500 MG PO TABS
1000.0000 mg | ORAL_TABLET | ORAL | Status: AC
  Administered 2017-06-07: 1000 mg via ORAL
  Filled 2017-06-07: qty 2

## 2017-06-07 MED ORDER — LEVETIRACETAM 750 MG PO TABS
1500.0000 mg | ORAL_TABLET | Freq: Every day | ORAL | Status: DC
  Administered 2017-06-08: 1500 mg via ORAL
  Filled 2017-06-07: qty 2

## 2017-06-07 MED ORDER — OXCARBAZEPINE 300 MG PO TABS
450.0000 mg | ORAL_TABLET | ORAL | Status: DC
Start: 2017-06-07 — End: 2017-06-09
  Administered 2017-06-07 – 2017-06-08 (×2): 450 mg via ORAL
  Filled 2017-06-07 (×2): qty 2

## 2017-06-07 MED ORDER — LACTATED RINGERS IV SOLN
INTRAVENOUS | Status: DC
  Administered 2017-06-07: 11:00:00 via INTRAVENOUS

## 2017-06-07 MED ORDER — OXCARBAZEPINE 300 MG PO TABS
300.0000 mg | ORAL_TABLET | ORAL | Status: DC
  Administered 2017-06-08 – 2017-06-09 (×2): 300 mg via ORAL
  Filled 2017-06-07 (×2): qty 1

## 2017-06-07 MED ORDER — DEXTROSE 50 % IV SOLN
INTRAVENOUS | Status: AC
  Administered 2017-06-07: 25 mL
  Filled 2017-06-07: qty 50

## 2017-06-07 MED ORDER — LEVETIRACETAM 750 MG PO TABS
750.0000 mg | ORAL_TABLET | ORAL | Status: DC
  Administered 2017-06-07: 750 mg via ORAL
  Filled 2017-06-07 (×2): qty 1

## 2017-06-07 MED ORDER — LACTATED RINGERS IV SOLN
INTRAVENOUS | Status: DC | PRN
  Administered 2017-06-07: 12:00:00 via INTRAVENOUS

## 2017-06-07 MED ORDER — PROPOFOL 10 MG/ML IV BOLUS
INTRAVENOUS | Status: DC | PRN
  Administered 2017-06-07 (×3): 50 mg via INTRAVENOUS
  Administered 2017-06-07: 30 mg via INTRAVENOUS
  Administered 2017-06-07: 50 mg via INTRAVENOUS

## 2017-06-07 MED ORDER — LEVETIRACETAM 250 MG PO TABS
375.0000 mg | ORAL_TABLET | Freq: Every day | ORAL | Status: DC
  Administered 2017-06-07: 375 mg via ORAL

## 2017-06-07 MED ORDER — SODIUM CHLORIDE 0.9 % IV SOLN: INTRAVENOUS | Status: DC | PRN

## 2017-06-07 MED ORDER — LIDOCAINE HCL (CARDIAC) 20 MG/ML IV SOLN
INTRAVENOUS | Status: DC | PRN
  Administered 2017-06-07: 20 mg via INTRATRACHEAL
  Administered 2017-06-07: 50 mg via INTRATRACHEAL

## 2017-06-07 MED ORDER — AMOXICILLIN-POT CLAVULANATE 875-125 MG PO TABS
1.0000 | ORAL_TABLET | Freq: Two times a day (BID) | ORAL | Status: DC
  Administered 2017-06-07 – 2017-06-09 (×5): 1 via ORAL
  Filled 2017-06-07 (×7): qty 1

## 2017-06-07 MED ORDER — LIDOCAINE HCL (PF) 1 % IJ SOLN
INTRAMUSCULAR | Status: AC
  Filled 2017-06-07: qty 30

## 2017-06-07 MED ORDER — LEVETIRACETAM 500 MG PO TABS
500.0000 mg | ORAL_TABLET | ORAL | Status: DC
  Administered 2017-06-07: 500 mg via ORAL
  Filled 2017-06-07: qty 1

## 2017-06-07 SURGICAL SUPPLY — 15 items

## 2017-06-07 NOTE — Progress Notes (Signed)
Subjective: [name] reports feeling fine today. He was examined in his room with his wife at bedside. He endorses continued dyspnea, but no chest pain, N/V, abdominal pain. He and his wife spoke to GI this morning and they are doing a EGD at 11am.  Objective: Vital signs in last 24 hours: Vitals:   06/06/17 2015 06/06/17 2044 06/07/17 0433 06/07/17 1021  BP: 122/77 120/71 (!) 117/95 117/67  Pulse:  (!) 102 100 86  Resp:  (!) 24 18 11   Temp:  98 F (36.7 C) 98 F (36.7 C) 97.8 F (36.6 C)  TempSrc:  Oral Oral Oral  SpO2:   95% 97%  Weight:  83 kg (183 lb)    Height:  5\' 11"  (1.803 m)     Intake/Output Summary (Last 24 hours) at 06/07/17 1227 Last data filed at 06/07/17 1220  Gross per 24 hour  Intake           1825.5 ml  Output                5 ml  Net           1820.5 ml   BP 117/67   Pulse 86   Temp 97.8 F (36.6 C) (Oral)   Resp 11   Ht 5\' 11"  (1.803 m)   Wt 83 kg (183 lb)   SpO2 97%   BMI 25.52 kg/m  General appearance: alert, cooperative, appears stated age and no distress Lungs: clear to auscultation bilaterally, increased work of breathing Heart: regular rate and rhythm, S1, S2 normal, no murmur, click, rub or gallop Abdomen: soft, non-tender; bowel sounds normal; no masses,  no organomegaly Extremities: extremities atraumatic, no cyanosis or edema. Hands with bilateral swan neck deformity and ulnar deviation, stiffness Neurologic: Grossly normal  Lab Results: Cl 113 CO2 16 Hg/HCT 11.7/36.6 PT/INR 15.9/1.26 Micro Results: No results found for this or any previous visit (from the past 240 hour(s)). Studies/Results: Dg Chest 2 View  Result Date: 06/07/2017 CLINICAL DATA:  Shortness of breath and cough EXAM: CHEST  2 VIEW COMPARISON:  Chest radiograph May 30, 2017; chest CT June 06, 2017 FINDINGS: There is patchy airspace opacity in the right upper lobe, unchanged from 1 day prior. A somewhat lesser degree of similar patchy opacity noted in the left upper  lobe. There is diffuse interstitial prominence in the bases, likely with mild interstitial edema. There is patchy airspace opacity in the right base. There are small pleural effusions bilaterally. There is cardiomegaly with mild pulmonary venous hypertension. There is aortic atherosclerosis. No evident adenopathy. No bone lesions. IMPRESSION: Evidence a degree of congestive heart failure. Areas of airspace opacity in both upper lobes and right base likely represent superimposed multifocal pneumonia. Both pneumonia and alveolar edema may be present concurrently. Appearance is similar compared to 1 day prior. There is aortic atherosclerosis. Aortic Atherosclerosis (ICD10-I70.0). Electronically Signed   By: Lowella Grip III M.D.   On: 06/07/2017 08:05   Ct Chest W Contrast  Result Date: 06/06/2017 CLINICAL DATA:  Cough for weeks. EXAM: CT CHEST WITH CONTRAST TECHNIQUE: Multidetector CT imaging of the chest was performed during intravenous contrast administration. CONTRAST:  75 cc Isovue 300 COMPARISON:  None. FINDINGS: Cardiovascular: The heart is enlarged. Coronary artery calcification is noted. Atherosclerotic calcification is noted in the wall of the thoracic aorta. Mediastinum/Nodes: 11 mm short axis subcarinal lymph node is associated with a 13 mm lymph node in the right hilum. The esophagus is diffusely patulous and filled  with fluid and debris. Lungs/Pleura: Interlobular septal thickening associated with central ground-glass opacity in the right upper lobes. There is patchy nodular airspace disease in the central right middle lobe with bibasilar collapse/consolidation and small bilateral pleural effusions. Upper Abdomen: Small nonobstructing stone noted right kidney. Musculoskeletal: Bone windows reveal no worrisome lytic or sclerotic osseous lesions. IMPRESSION: 1. Patchy bilateral airspace disease with bilateral upper lobe mosaic attenuation. Multifocal pneumonia likely with potential edema or  hemorrhage in the upper lobes. 2. Bilateral pleural effusions, right greater than left. 3. Diffusely patulous esophagus filled with fluid and debris. 4. Mediastinal and right hilar lymphadenopathy, likely reactive. 5. Follow-up CT imaging recommended to ensure resolution of findings. Electronically Signed   By: Misty Stanley M.D.   On: 06/06/2017 17:45   Medications: I have reviewed the patient's current medications. Scheduled Meds: . [MAR Hold] amoxicillin-clavulanate  1 tablet Oral Q12H  . [MAR Hold] aspirin EC  81 mg Oral Daily  . [MAR Hold] celecoxib  200 mg Oral Daily  . [MAR Hold] enoxaparin (LOVENOX) injection  40 mg Subcutaneous Q24H  . [MAR Hold] hydroxychloroquine  200 mg Oral BID WC  . [MAR Hold] levETIRAcetam  1,500 mg Oral QAC breakfast   And  . [MAR Hold] levETIRAcetam  375 mg Oral Q1200   And  . [MAR Hold] levETIRAcetam  500 mg Oral Q24H   And  . [MAR Hold] levETIRAcetam  750 mg Oral Q24H  . [MAR Hold] NIFEdipine  60 mg Oral QPM  . [MAR Hold] OXcarbazepine  300 mg Oral Q24H   And  . [MAR Hold] OXcarbazepine  450 mg Oral Q24H  . [MAR Hold] pantoprazole  40 mg Oral Daily  . [MAR Hold] predniSONE  5 mg Oral Q breakfast   Continuous Infusions: . lactated ringers 10 mL/hr at 06/07/17 1032   PRN Meds:.[MAR Hold] acetaminophen Assessment/Plan: Active Problems:   Aspiration pneumonia (HCC)   Dysphagia  Aspiration pneumonia Bilateral Pleural Effusion Likely 2/2 esophageal stricture. Bicarb (now 16) can be explained by pt's respiratory alkalosis with compensatory metabolic acidosis. CXR shows small bilat pleural effusions. Bilat upper lung opacities could be aspiration pneumonia or pneumonitis. -Repeat BMP in AM -Continue Augmentin  Mixed connective tissue disease CREST Esophageal dysmotility/dysphagia CT shows esophagus filled with fluid and debris, suggesting dysmotility 2/2 pt's scleroderma. This could be leading to pt's dysphagia, aspiration, SOB. -Per GI, EGD for  possible disimpacting/dilation today -Continue prednisone 5mg  daily, celecoxib 200mg  qd, nifedipine 60mg  -Hold plaquenil 200mg  bid while pt is NPO  Seizure disorder -Keppra and oxcarbazepine.  Dispo: Expected length of stay greater than 2 nights.  This is a Careers information officer Note.  The care of the patient was discussed with Dr. Dr. Heber [place] and the assessment and plan formulated with their assistance.  Please see their attached note for official documentation of the daily encounter.   LOS: 0 days   Murlean Caller, Medical Student 06/07/2017, 12:27 PM  Pager: [phone]

## 2017-06-07 NOTE — Progress Notes (Signed)
Patient received from ED via bed. Vital signs are stable. Patient given instruction about call light and phone.patient is alert oriented x4. Bed kept in low position and side rail up x2. Patient is on  npo.

## 2017-06-07 NOTE — Progress Notes (Signed)
   Subjective: Mr. Mccook was seen laying in his bed today with wife at bedside. He states that he continues to have difficulty breathing. He denies any chest pain. He denies any nausea, vomiting, and stomach discomfort. He states that he has not had bowel movements for a few days now, but does not recall when his last bowel movement was.   Objective:  Vital signs in last 24 hours: Vitals:   06/07/17 1230 06/07/17 1240 06/07/17 1250 06/07/17 1300  BP: 102/68 93/65 107/72 112/72  Pulse: 85 82 79 64  Resp: (!) 23 (!) 24 (!) 26 (!) 32  Temp: 97.7 F (36.5 C)     TempSrc: Oral     SpO2: 94% 99% 96% 96%  Weight:      Height:       Physical Exam  Constitutional: He appears well-developed and well-nourished.  HENT:  Head: Normocephalic and atraumatic.  Cardiovascular: Normal rate, regular rhythm and normal heart sounds.   Pulmonary/Chest: Tachypnea noted. He is in respiratory distress (slightly better than previous exam).  Abdominal: Soft. Bowel sounds are normal.  Musculoskeletal:  No pitting noted bilaterally  Skin:  Scaly patches and erythematous skin consistent with seborrheic dermatitis    Assessment/Plan: Dysphagia Bilateral Pleural Effusion Aspiration Pneumonia -EGD done today 7/20 found severe dysmotility, no obvious strictures noted, 2 gastric polyps found, ues stenosis, and a cricopharyngeal bar noted Empiric dilation was performed -Modified barium swallow ordered -No food 4 hrs of bedtime and elevate head of bed -pending path report of polyp -Aspiration pneumonia is likely 2/2 his poor esophageal clearance. Continue Augmentin.   -I suspect this will be a recurring problem due to the severe esophageal stenosis.   -Interventional radiology consult done to possibly perform a thoracocentesis due to possible loculations and his likely  parapneumonic effusion.  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   Lars Mage, MD 06/07/2017, 1:28 PM Pager: Pager: [phone]

## 2017-06-07 NOTE — Procedures (Signed)
PROCEDURE SUMMARY:  Successful US guided left diagnostic thoracentesis. Yielded 110 mL of blood-tinged fluid. Pt tolerated procedure well. No immediate complications.  Specimen was sent for labs. CXR ordered.  Docia Barrier PA-C 06/07/2017 3:49 PM

## 2017-06-07 NOTE — Anesthesia Preprocedure Evaluation (Addendum)
Anesthesia Evaluation  Patient identified by MRN, date of birth, ID band Patient awake    Reviewed: Allergy & Precautions, H&P , NPO status , Patient's Chart, lab work & pertinent test results  Airway Mallampati: II   Neck ROM: full    Dental  (+) Teeth Intact, Dental Advidsory Given   Pulmonary pneumonia,    breath sounds clear to auscultation       Cardiovascular + Peripheral Vascular Disease   Rhythm:regular Rate:Normal     Neuro/Psych Seizures -,  PSYCHIATRIC DISORDERS Anxiety Depression  Neuromuscular disease    GI/Hepatic hiatal hernia, GERD  ,Esophageal stricture.  Food impaction   Endo/Other    Renal/GU      Musculoskeletal  (+) Arthritis , Ankylosing spondylitis   Abdominal   Peds  Hematology   Anesthesia Other Findings   Reproductive/Obstetrics                            Anesthesia Physical Anesthesia Plan  ASA: III  Anesthesia Plan: MAC   Post-op Pain Management:    Induction: Intravenous  PONV Risk Score and Plan: 1 and Ondansetron, Dexamethasone and Treatment may vary due to age or medical condition  Airway Management Planned: Nasal Cannula  Additional Equipment:   Intra-op Plan:   Post-operative Plan:   Informed Consent: I have reviewed the patients History and Physical, chart, labs and discussed the procedure including the risks, benefits and alternatives for the proposed anesthesia with the patient or authorized representative who has indicated his/her understanding and acceptance.   Dental Advisory Given  Plan Discussed with: CRNA and Anesthesiologist  Anesthesia Plan Comments: (Long discussion with the patient and wife regarding risks of different anesthetic options.  They feel strongly that they wish to avoid general anesthesia due to the potential impact on his memory.  A sedation was chosen while fully understanding the possible risk of aspiration from  retained esophageal contents.)      Anesthesia Quick Evaluation

## 2017-06-07 NOTE — Consult Note (Signed)
Referring Provider: Internal Medicine Teaching Service  Primary Care Physician:  Asencion Noble, MD Primary Gastroenterologist: Scarlette Shorts, MD  Reason for Consultation:  dysphagia  ASSESSMENT AND PLAN:   22. 71 yo male with recurrent dysphagia and fluid / debris filled esophagus on chest CT scan. 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 He probably has recurrent stricture.   -Needs EGD for possible disimpaction / possible dilation ASAP as he is at continued risk of further aspiration. The risks and benefits of EGD were discussed and the patient agrees to proceed. Procedure planned for 11:00 am with MAC.    2. PNA,  Likely aspiration related to poor esophageal clearance. On Augmentin. 02 sats okay , no 02 requirement at present. but he is tachypneic. We will have anesthesia assistance for EGD. Wife concerned about sedation, told by Neuro that he should never have general anesthesia. Explained to her that this was not general anesthesia. Hopefully respiratory status will remain stable during procedure.    3. 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 He has short term memory loss. Followed at [facility]. Last seizure reportedly in April or May. On Keppra and Trileptal  4. CREST syndrome  5. Ankylosing spondylitis    HPI: Robert Carey is a 71 y.o. 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 He has required esophageal dilations in the past but none since 2005 at which time a distal esophageal stricture was dilated Venia Minks) to 17.5 mm. He takes a daily PPI, no breakthrough GERD symptoms. Patient has some memory disturbance so wife helps with history. Approximately three weeks ago patient began having a cough while eating  A couple of weeks ago he had episode of dysphagia while eating a chicken sandwich. Since then he has had a couple more episodes of  dysphagia but main problem is that of coughing with meals. No problems with liquids. He has become increasing SOB over last couple of weeks. PCP arranged for chest CT scan and patient had an appt with [place] this afternoon. Wife concerned about inability of patient to swallow medications so she brought him to ED last night. Chest CT scan suggests multifocal PNA with potential edema of hemorrhage in upper lobes, bilateral plerual effusions, R> left, diffusely patulous esophagus filled with fluid and debris. No other GI complaints such as bowel changes, blood in stool. He has no chest pain.    Past Medical History:  Diagnosis Date  . Arthritis   . AS (ankylosing spondylitis) (HCC)   . Aspiration pneumonia ([place]) 05/2017  . Cataract   . CREST syndrome ([place])   . Dementia   . Diverticulosis of colon (without mention of hemorrhage)   . Esophageal reflux   . Gastroparesis   . Hiatal hernia   . Mixed connective tissue disease ([place])   . Motility disorder, esophageal   . Raynauds phenomenon   . Scleroderma ([place])   . Seizure disorder ([place])   . Seizures ([place])    07/2016  . Skin ulcers of foot, bilateral ([place]) 01/03/2017   R foot only  . Spondylitis, ankylosing ([place])   . Stricture and stenosis of esophagus     Past Surgical History:  Procedure Laterality Date  . IR GENERIC HISTORICAL  02/07/2017   IR TIB-PERO ART UNI PTA EA ADD VESSEL MOD SED 02/07/2017 Corrie Mckusick, DO [facility]  . IR GENERIC HISTORICAL  02/07/2017   IR US  GUIDE VASC ACCESS LEFT 02/07/2017 Corrie Mckusick, DO [facility]  . IR GENERIC HISTORICAL  02/07/2017   IR TIB-PERO ART PTA MOD SED 02/07/2017 Corrie Mckusick, DO [facility]  . IR GENERIC HISTORICAL  02/07/2017   IR ANGIOGRAM SELECTIVE EACH ADDITIONAL VESSEL 02/07/2017 Corrie Mckusick, DO [facility]  . IR GENERIC HISTORICAL  02/07/2017   IR ANGIOGRAM EXTREMITY RIGHT 02/07/2017 Corrie Mckusick, DO [facility]  . IR GENERIC HISTORICAL  02/07/2017   IR THROMBECT PRIM MECH INIT ([facility])  MOD SED 02/07/2017 Corrie Mckusick, DO [facility]  . IR RADIOLOGIST EVAL & MGMT  02/27/2017  . IR RADIOLOGIST EVAL & MGMT  01/22/2017  . NOSE SURGERY  2005  . THUMB ARTHROSCOPY Left   . TOE SURGERY     right great toe  . WISDOM TOOTH EXTRACTION      Prior to Admission medications   Medication Sig Start Date End Date Taking? Authorizing Provider  acetaminophen (TYLENOL) 500 MG tablet Take 500 mg by mouth every 6 (six) hours as needed.   Yes [provider]  amoxicillin-clavulanate (AUGMENTIN) 875-125 MG tablet Take 1 tablet by mouth 2 (two) times daily. HAS 1 DOSE LEFT 05/30/17  Yes [provider]  aspirin EC 81 MG tablet Take 81 mg by mouth daily.   Yes [provider]  Calcium Carbonate-Vitamin D (CALCARB 600/D PO) Take 1 tablet by mouth daily.    Yes [provider]  celecoxib (CELEBREX) 200 MG capsule Take 200 mg by mouth daily.   Yes [provider]  clobetasol cream (TEMOVATE) 2.80 % Apply 1 application topically 2 (two) times daily as needed (ECZEMA).    Yes [provider]  hydroxychloroquine (PLAQUENIL) 200 MG tablet Take 200 mg by mouth 2 (two) times daily with a meal.  04/30/17  Yes [provider]  ketoconazole (NIZORAL) 2 % cream Apply 1 application topically 2 (two) times daily as needed for irritation.    Yes [provider]  levETIRAcetam (KEPPRA) 250 MG tablet Take 375-500 mg by mouth See admin instructions. Takes two 7108m tablets in the morning for a total of 15052mdaily in the morning, 375 mg at noon, Takes two 25082mablets (500 mg) daily in the evening at 7pm, and 750 mg at 9p   Yes [provider]  levETIRAcetam (KEPPRA) 750 MG tablet Take 750-1,500 mg by mouth See admin instructions. Takes two 750m58mblets in the morning for a total of 1500mg26mly in the morning, 375 mg at noon, Takes two 250mg 30mets (500 mg) daily in the evening at 7pm, and 750 mg at 9p   Yes [provider]    Multiple Vitamin (MULTIVITAMIN) capsule Take 1 capsule by mouth daily.   Yes [provider]  NIFEdipine (PROCARDIA XL/ADALAT-CC) 60 MG 24 hr tablet Take 60 mg by mouth every evening.    Yes [provider]  NONFORMULARY OR COMPOUNDED ITEM Apply 1 application topically 2 (two) times daily as needed. Apply to neck twice daily as needed. Meloxicam, lidocaine, topiramate, prilocaine   Yes [provider]  NONFORMULARY OR COMPOUNDED ITEM Apply 1 application topically 4 (four) times daily as needed. Apply to right foot as needed. Ketoconazole, diclofenac, baclofen, lidocaine, gabapentin   Yes [provider]  omeprazole (PRILOSEC) 40 MG capsule TAKE (1) CAPSULE BY MOUTH ONCE DAILY. Patient taking differently: TAKE 40MG BY MOUTH ONCE DAILY. 05/27/17  Yes Perry,Irene ShipperOXcarbazepine (TRILEPTAL) 150 MG tablet TAKE 300MG IN THE MORNING AND  450MG IN THE EVENING 04/10/17  Yes [provider]  predniSONE (DELTASONE) 5 MG tablet Take 5 mg by mouth daily.   Yes [provider]  Probiotic Product (PROBIOTIC PO) Take 1 tablet by mouth daily.   Yes [provider]  silver sulfADIAZINE (SILVADENE) 1 % cream Apply 1 application topically daily as needed.    Yes [provider]  Na Sulfate-K Sulfate-Mg Sulf 17.5-3.13-1.6 GM/180ML SOLN Suprep as directed.  No substitutions. Patient not taking: Reported on 06/04/2017 03/11/17   Irene Shipper, MD  predniSONE (STERAPRED UNI-PAK 48 TAB) 5 MG (48) TBPK tablet Take 1 tablet by mouth as directed. 03/25/17   [provider]    Current Facility-Administered Medications  Medication Dose Route Frequency Provider Last Rate Last Dose  . 0.9 %  sodium chloride infusion   Intravenous Continuous Collier Salina, MD 75 mL/hr at 06/06/17 2146    . acetaminophen (TYLENOL) tablet 500 mg  500 mg Oral Q6H PRN Collier Salina, MD      . amoxicillin-clavulanate (AUGMENTIN) 875-125 MG per tablet 1 tablet   1 tablet Oral Q12H Rice, Resa Miner, MD      . aspirin EC tablet 81 mg  81 mg Oral Daily Collier Salina, MD   81 mg at 06/07/17 0826  . celecoxib (CELEBREX) capsule 200 mg  200 mg Oral Daily Collier Salina, MD   200 mg at 06/07/17 0905  . enoxaparin (LOVENOX) injection 40 mg  40 mg Subcutaneous Q24H Collier Salina, MD   40 mg at 06/06/17 2144  . hydroxychloroquine (PLAQUENIL) tablet 200 mg  200 mg Oral BID WC Rice, Resa Miner, MD      . Derrill Memo ON 06/08/2017] levETIRAcetam (KEPPRA) tablet 1,500 mg  1,500 mg Oral QAC breakfast Rice, Resa Miner, MD       And  . levETIRAcetam (KEPPRA) tablet 375 mg  375 mg Oral Q1200 Rice, Resa Miner, MD       And  . levETIRAcetam (KEPPRA) tablet 500 mg  500 mg Oral Q24H Rice, Resa Miner, MD       And  . levETIRAcetam (KEPPRA) tablet 750 mg  750 mg Oral Q24H Rice, Resa Miner, MD      . NIFEdipine (PROCARDIA-XL/ADALAT CC) 24 hr tablet 60 mg  60 mg Oral QPM Collier Salina, MD   60 mg at 06/06/17 2255  . [START ON 06/08/2017] Oxcarbazepine (TRILEPTAL) tablet 300 mg  300 mg Oral Q24H Rice, Resa Miner, MD       And  . Oxcarbazepine (TRILEPTAL) tablet 450 mg  450 mg Oral Q24H Rice, Resa Miner, MD      . pantoprazole (PROTONIX) EC tablet 40 mg  40 mg Oral Daily Collier Salina, MD   40 mg at 06/07/17 0827  . predniSONE (DELTASONE) tablet 5 mg  5 mg Oral Q breakfast Collier Salina, MD   5 mg at 06/07/17 6270    Allergies as of 06/06/2017 - Review Complete 06/06/2017  Allergen Reaction Noted  . Dilantin [phenytoin sodium extended] Hives 03/21/2015  . Phenytoin sodium extended Rash 01/21/2012    Family History  Problem Relation Age of Onset  . Breast cancer Mother   . Pancreatic cancer Mother   . Diabetes Mother   . Heart disease Mother   . Heart disease Father   . COPD Father   . Melanoma Sister   . Stroke Brother   . Colon cancer Neg Hx     Social  History   Social History  . Marital status: Married     Spouse name: N/A  . Number of children: 2  . Years of education: N/A   Occupational History  . retired Retired   Social History Main Topics  . Smoking status: Never Smoker  . Smokeless tobacco: Never Used  . Alcohol use No  . Drug use: No  . Sexual activity: Not on file   Other Topics Concern  . Not on file   Social History Narrative  . No narrative on file    Review of Systems: All systems reviewed and negative except where noted in HPI.  Physical Exam: Vital signs in last 24 hours: Temp:  [98 F (36.7 C)] 98 F (36.7 C) (07/20 0433) Pulse Rate:  [84-102] 100 (07/20 0433) Resp:  [12-28] 18 (07/20 0433) BP: (107-124)/(69-99) 117/95 (07/20 0433) SpO2:  [92 %-100 %] 95 % (07/20 0433) Weight:  [183 lb (83 kg)] 183 lb (83 kg) (07/19 2044) Last BM Date: 06/06/17 General:   Alert, well-developed,  White male in NAD Psych:  Pleasant, cooperative. Normal mood and affect. Eyes:  Pupils equal, sclera clear, no icterus.   Conjunctiva pink. Ears:  Normal auditory acuity. Nose:  No deformity, discharge,  or lesions. Neck:  Supple; no masses Lungs:  Decreased breath sounds and crackles in BLL. Tachypnea.  No wheezes, , or rhonchi.  Heart:  Regular rate and rhythm; no murmurs, no edema Abdomen:  Soft, non-distended, nontender, BS active, no palp mass    Rectal:  Deferred  Msk:  Symmetrical without gross deformities. . Pulses:  Normal pulses noted. Neurologic:  Alert and  oriented x4;  grossly normal neurologically. Skin:  Intact without significant lesions or rashes..   Intake/Output from previous day: 07/19 0701 - 07/20 0700 In: 1102.5 [I.V.:602.5; IV Piggyback:500] Out: -  Intake/Output this shift: No intake/output data recorded.  Lab Results:  Recent Labs  06/06/17 1610 06/07/17 0431  WBC 8.7 8.7  HGB 11.7* 11.7*  HCT 36.3* 36.6*  PLT 191 183   BMET  Recent Labs  06/06/17 1610 06/07/17 0431  NA 140 142  K 3.9 3.5  CL 111 113*  CO2 18* 16*  GLUCOSE 94  65  BUN 20 17  CREATININE 1.02 0.99  CALCIUM 8.6* 8.4*   LFT  Recent Labs  06/06/17 1610  PROT 6.5  ALBUMIN 3.0*  AST 52*  ALT 25  ALKPHOS 59  BILITOT 0.4   PT/INR  Recent Labs  06/07/17 0431  LABPROT 15.9*  INR 1.26     Studies/Results: Dg Chest 2 View  Result Date: 06/07/2017 CLINICAL DATA:  Shortness of breath and cough EXAM: CHEST  2 VIEW COMPARISON:  Chest radiograph May 30, 2017; chest CT June 06, 2017 FINDINGS: There is patchy airspace opacity in the right upper lobe, unchanged from 1 day prior. A somewhat lesser degree of similar patchy opacity noted in the left upper lobe. There is diffuse interstitial prominence in the bases, likely with mild interstitial edema. There is patchy airspace opacity in the right base. There are small pleural effusions bilaterally. There is cardiomegaly with mild pulmonary venous hypertension. There is aortic atherosclerosis. No evident adenopathy. No bone lesions. IMPRESSION: Evidence a degree of congestive heart failure. Areas of airspace opacity in both upper lobes and right base likely represent superimposed multifocal pneumonia. Both pneumonia and alveolar edema may be present concurrently. Appearance is similar compared to 1 day prior. There is aortic atherosclerosis. Aortic Atherosclerosis (ICD10-I70.0). Electronically Signed  By: Lowella Grip III M.D.   On: 06/07/2017 08:05   Ct Chest W Contrast  Result Date: 06/06/2017 CLINICAL DATA:  Cough for weeks. EXAM: CT CHEST WITH CONTRAST TECHNIQUE: Multidetector CT imaging of the chest was performed during intravenous contrast administration. CONTRAST:  75 cc Isovue 300 COMPARISON:  None. FINDINGS: Cardiovascular: The heart is enlarged. Coronary artery calcification is noted. Atherosclerotic calcification is noted in the wall of the thoracic aorta. Mediastinum/Nodes: 11 mm short axis subcarinal lymph node is associated with a 13 mm lymph node in the right hilum. The esophagus is  diffusely patulous and filled with fluid and debris. Lungs/Pleura: Interlobular septal thickening associated with central ground-glass opacity in the right upper lobes. There is patchy nodular airspace disease in the central right middle lobe with bibasilar collapse/consolidation and small bilateral pleural effusions. Upper Abdomen: Small nonobstructing stone noted right kidney. Musculoskeletal: Bone windows reveal no worrisome lytic or sclerotic osseous lesions. IMPRESSION: 1. Patchy bilateral airspace disease with bilateral upper lobe mosaic attenuation. Multifocal pneumonia likely with potential edema or hemorrhage in the upper lobes. 2. Bilateral pleural effusions, right greater than left. 3. Diffusely patulous esophagus filled with fluid and debris. 4. Mediastinal and right hilar lymphadenopathy, likely reactive. 5. Follow-up CT imaging recommended to ensure resolution of findings. Electronically Signed   By: Misty Stanley M.D.   On: 06/06/2017 17:45     Tye Savoy, NP-C @  06/07/2017, 9:07 AM  Pager number [phone]

## 2017-06-07 NOTE — H&P (View-Only) (Signed)
 Referring Provider: Internal Medicine Teaching Service  Primary Care Physician:  Fagan, Roy, MD Primary Gastroenterologist: John Perry, MD  Reason for Consultation:  dysphagia  ASSESSMENT AND PLAN:   1. 71 yo male with recurrent dysphagia and fluid / debris filled esophagus on chest CT scan. 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 He probably has recurrent stricture.   -Needs EGD for possible disimpaction / possible dilation ASAP as he is at continued risk of further aspiration. The risks and benefits of EGD were discussed and the patient agrees to proceed. Procedure planned for 11:00 am with MAC.    2. PNA,  Likely aspiration related to poor esophageal clearance. On Augmentin. 02 sats okay , no 02 requirement at present. but he is tachypneic. We will have anesthesia assistance for EGD. Wife concerned about sedation, told by Neuro that he should never have general anesthesia. Explained to her that this was not general anesthesia. Hopefully respiratory status will remain stable during procedure.    3. 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 He has short term memory loss. Followed at [facility]. Last seizure reportedly in April or May. On Keppra and Trileptal  4. CREST syndrome  5. Ankylosing spondylitis    HPI: Robert Carey is a 71 y.o. 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 He has required esophageal dilations in the past but none since 2005 at which time a distal esophageal stricture was dilated ([place]) to 17.5 mm. He takes a daily PPI, no breakthrough GERD symptoms. Patient has some memory disturbance so wife helps with history. Approximately three weeks ago patient began having a cough while eating  A couple of weeks ago he had episode of dysphagia while eating a chicken sandwich. Since then he has had a couple more episodes of  dysphagia but main problem is that of coughing with meals. No problems with liquids. He has become increasing SOB over last couple of weeks. PCP arranged for chest CT scan and patient had an appt with us this afternoon. Wife concerned about inability of patient to swallow medications so she brought him to ED last night. Chest CT scan suggests multifocal PNA with potential edema of hemorrhage in upper lobes, bilateral plerual effusions, R> left, diffusely patulous esophagus filled with fluid and debris. No other GI complaints such as bowel changes, blood in stool. He has no chest pain.    Past Medical History:  Diagnosis Date  . Arthritis   . AS (ankylosing spondylitis) (HCC)   . Aspiration pneumonia (HCC) 05/2017  . Cataract   . CREST syndrome (HCC)   . Dementia   . Diverticulosis of colon (without mention of hemorrhage)   . Esophageal reflux   . Gastroparesis   . Hiatal hernia   . Mixed connective tissue disease (HCC)   . Motility disorder, esophageal   . Raynauds phenomenon   . Scleroderma (HCC)   . Seizure disorder (HCC)   . Seizures (HCC)    07/2016  . Skin ulcers of foot, bilateral (HCC) 01/03/2017   R foot only  . Spondylitis, ankylosing (HCC)   . Stricture and stenosis of esophagus     Past Surgical History:  Procedure Laterality Date  . IR GENERIC HISTORICAL  02/07/2017   IR TIB-PERO ART UNI PTA EA ADD VESSEL MOD SED 02/07/2017 Jaime Wagner, DO [facility]  . IR GENERIC HISTORICAL  02/07/2017   IR US   GUIDE VASC ACCESS LEFT 02/07/2017 Jaime Wagner, DO [facility]  . IR GENERIC HISTORICAL  02/07/2017   IR TIB-PERO ART PTA MOD SED 02/07/2017 Jaime Wagner, DO [facility]  . IR GENERIC HISTORICAL  02/07/2017   IR ANGIOGRAM SELECTIVE EACH ADDITIONAL VESSEL 02/07/2017 Jaime Wagner, DO [facility]  . IR GENERIC HISTORICAL  02/07/2017   IR ANGIOGRAM EXTREMITY RIGHT 02/07/2017 Jaime Wagner, DO [facility]  . IR GENERIC HISTORICAL  02/07/2017   IR THROMBECT PRIM MECH INIT (INCLU)  MOD SED 02/07/2017 Jaime Wagner, DO [facility]  . IR RADIOLOGIST EVAL & MGMT  02/27/2017  . IR RADIOLOGIST EVAL & MGMT  01/22/2017  . NOSE SURGERY  2005  . THUMB ARTHROSCOPY Left   . TOE SURGERY     right great toe  . WISDOM TOOTH EXTRACTION      Prior to Admission medications   Medication Sig Start Date End Date Taking? Authorizing Provider  acetaminophen (TYLENOL) 500 MG tablet Take 500 mg by mouth every 6 (six) hours as needed.   Yes [provider]  amoxicillin-clavulanate (AUGMENTIN) 875-125 MG tablet Take 1 tablet by mouth 2 (two) times daily. HAS 1 DOSE LEFT 05/30/17  Yes [provider]  aspirin EC 81 MG tablet Take 81 mg by mouth daily.   Yes [provider]  Calcium Carbonate-Vitamin D (CALCARB 600/D PO) Take 1 tablet by mouth daily.    Yes [provider]  celecoxib (CELEBREX) 200 MG capsule Take 200 mg by mouth daily.   Yes [provider]  clobetasol cream (TEMOVATE) 0.05 % Apply 1 application topically 2 (two) times daily as needed (ECZEMA).    Yes [provider]  hydroxychloroquine (PLAQUENIL) 200 MG tablet Take 200 mg by mouth 2 (two) times daily with a meal.  04/30/17  Yes [provider]  ketoconazole (NIZORAL) 2 % cream Apply 1 application topically 2 (two) times daily as needed for irritation.    Yes [provider]  levETIRAcetam (KEPPRA) 250 MG tablet Take 375-500 mg by mouth See admin instructions. Takes two 750mg tablets in the morning for a total of 1500mg daily in the morning, 375 mg at noon, Takes two 250mg tablets (500 mg) daily in the evening at 7pm, and 750 mg at 9p   Yes [provider]  levETIRAcetam (KEPPRA) 750 MG tablet Take 750-1,500 mg by mouth See admin instructions. Takes two 750mg tablets in the morning for a total of 1500mg daily in the morning, 375 mg at noon, Takes two 250mg tablets (500 mg) daily in the evening at 7pm, and 750 mg at 9p   Yes [provider]    Multiple Vitamin (MULTIVITAMIN) capsule Take 1 capsule by mouth daily.   Yes [provider]  NIFEdipine (PROCARDIA XL/ADALAT-CC) 60 MG 24 hr tablet Take 60 mg by mouth every evening.    Yes [provider]  NONFORMULARY OR COMPOUNDED ITEM Apply 1 application topically 2 (two) times daily as needed. Apply to neck twice daily as needed. Meloxicam, lidocaine, topiramate, prilocaine   Yes [provider]  NONFORMULARY OR COMPOUNDED ITEM Apply 1 application topically 4 (four) times daily as needed. Apply to right foot as needed. Ketoconazole, diclofenac, baclofen, lidocaine, gabapentin   Yes [provider]  omeprazole (PRILOSEC) 40 MG capsule TAKE (1) CAPSULE BY MOUTH ONCE DAILY. Patient taking differently: TAKE 40MG BY MOUTH ONCE DAILY. 05/27/17  Yes Perry, John N, MD  OXcarbazepine (TRILEPTAL) 150 MG tablet TAKE 300MG IN THE MORNING AND   450MG IN THE EVENING 04/10/17  Yes [provider]  predniSONE (DELTASONE) 5 MG tablet Take 5 mg by mouth daily.   Yes [provider]  Probiotic Product (PROBIOTIC PO) Take 1 tablet by mouth daily.   Yes [provider]  silver sulfADIAZINE (SILVADENE) 1 % cream Apply 1 application topically daily as needed.    Yes [provider]  Na Sulfate-K Sulfate-Mg Sulf 17.5-3.13-1.6 GM/180ML SOLN Suprep as directed.  No substitutions. Patient not taking: Reported on 06/04/2017 03/11/17   Perry, John N, MD  predniSONE (STERAPRED UNI-PAK 48 TAB) 5 MG (48) TBPK tablet Take 1 tablet by mouth as directed. 03/25/17   [provider]    Current Facility-Administered Medications  Medication Dose Route Frequency Provider Last Rate Last Dose  . 0.9 %  sodium chloride infusion   Intravenous Continuous Rice, Christopher W, MD 75 mL/hr at 06/06/17 2146    . acetaminophen (TYLENOL) tablet 500 mg  500 mg Oral Q6H PRN Rice, Christopher W, MD      . amoxicillin-clavulanate (AUGMENTIN) 875-125 MG per tablet 1 tablet   1 tablet Oral Q12H Rice, Christopher W, MD      . aspirin EC tablet 81 mg  81 mg Oral Daily Rice, Christopher W, MD   81 mg at 06/07/17 0826  . celecoxib (CELEBREX) capsule 200 mg  200 mg Oral Daily Rice, Christopher W, MD   200 mg at 06/07/17 0905  . enoxaparin (LOVENOX) injection 40 mg  40 mg Subcutaneous Q24H Rice, Christopher W, MD   40 mg at 06/06/17 2144  . hydroxychloroquine (PLAQUENIL) tablet 200 mg  200 mg Oral BID WC Rice, Christopher W, MD      . [START ON 06/08/2017] levETIRAcetam (KEPPRA) tablet 1,500 mg  1,500 mg Oral QAC breakfast Rice, Christopher W, MD       And  . levETIRAcetam (KEPPRA) tablet 375 mg  375 mg Oral Q1200 Rice, Christopher W, MD       And  . levETIRAcetam (KEPPRA) tablet 500 mg  500 mg Oral Q24H Rice, Christopher W, MD       And  . levETIRAcetam (KEPPRA) tablet 750 mg  750 mg Oral Q24H Rice, Christopher W, MD      . NIFEdipine (PROCARDIA-XL/ADALAT CC) 24 hr tablet 60 mg  60 mg Oral QPM Rice, Christopher W, MD   60 mg at 06/06/17 2255  . [START ON 06/08/2017] Oxcarbazepine (TRILEPTAL) tablet 300 mg  300 mg Oral Q24H Rice, Christopher W, MD       And  . Oxcarbazepine (TRILEPTAL) tablet 450 mg  450 mg Oral Q24H Rice, Christopher W, MD      . pantoprazole (PROTONIX) EC tablet 40 mg  40 mg Oral Daily Rice, Christopher W, MD   40 mg at 06/07/17 0827  . predniSONE (DELTASONE) tablet 5 mg  5 mg Oral Q breakfast Rice, Christopher W, MD   5 mg at 06/07/17 0826    Allergies as of 06/06/2017 - Review Complete 06/06/2017  Allergen Reaction Noted  . Dilantin [phenytoin sodium extended] Hives 03/21/2015  . Phenytoin sodium extended Rash 01/21/2012    Family History  Problem Relation Age of Onset  . Breast cancer Mother   . Pancreatic cancer Mother   . Diabetes Mother   . Heart disease Mother   . Heart disease Father   . COPD Father   . Melanoma Sister   . Stroke Brother   . Colon cancer Neg Hx     Social   History   Social History  . Marital status: Married     Spouse name: N/A  . Number of children: 2  . Years of education: N/A   Occupational History  . retired Retired   Social History Main Topics  . Smoking status: Never Smoker  . Smokeless tobacco: Never Used  . Alcohol use No  . Drug use: No  . Sexual activity: Not on file   Other Topics Concern  . Not on file   Social History Narrative  . No narrative on file    Review of Systems: All systems reviewed and negative except where noted in HPI.  Physical Exam: Vital signs in last 24 hours: Temp:  [98 F (36.7 C)] 98 F (36.7 C) (07/20 0433) Pulse Rate:  [84-102] 100 (07/20 0433) Resp:  [12-28] 18 (07/20 0433) BP: (107-124)/(69-99) 117/95 (07/20 0433) SpO2:  [92 %-100 %] 95 % (07/20 0433) Weight:  [183 lb (83 kg)] 183 lb (83 kg) (07/19 2044) Last BM Date: 06/06/17 General:   Alert, well-developed,  White male in NAD Psych:  Pleasant, cooperative. Normal mood and affect. Eyes:  Pupils equal, sclera clear, no icterus.   Conjunctiva pink. Ears:  Normal auditory acuity. Nose:  No deformity, discharge,  or lesions. Neck:  Supple; no masses Lungs:  Decreased breath sounds and crackles in BLL. Tachypnea.  No wheezes, , or rhonchi.  Heart:  Regular rate and rhythm; no murmurs, no edema Abdomen:  Soft, non-distended, nontender, BS active, no palp mass    Rectal:  Deferred  Msk:  Symmetrical without gross deformities. . Pulses:  Normal pulses noted. Neurologic:  Alert and  oriented x4;  grossly normal neurologically. Skin:  Intact without significant lesions or rashes..   Intake/Output from previous day: 07/19 0701 - 07/20 0700 In: 1102.5 [I.V.:602.5; IV Piggyback:500] Out: -  Intake/Output this shift: No intake/output data recorded.  Lab Results:  Recent Labs  06/06/17 1610 06/07/17 0431  WBC 8.7 8.7  HGB 11.7* 11.7*  HCT 36.3* 36.6*  PLT 191 183   BMET  Recent Labs  06/06/17 1610 06/07/17 0431  NA 140 142  K 3.9 3.5  CL 111 113*  CO2 18* 16*  GLUCOSE 94  65  BUN 20 17  CREATININE 1.02 0.99  CALCIUM 8.6* 8.4*   LFT  Recent Labs  06/06/17 1610  PROT 6.5  ALBUMIN 3.0*  AST 52*  ALT 25  ALKPHOS 59  BILITOT 0.4   PT/INR  Recent Labs  06/07/17 0431  LABPROT 15.9*  INR 1.26     Studies/Results: Dg Chest 2 View  Result Date: 06/07/2017 CLINICAL DATA:  Shortness of breath and cough EXAM: CHEST  2 VIEW COMPARISON:  Chest radiograph May 30, 2017; chest CT June 06, 2017 FINDINGS: There is patchy airspace opacity in the right upper lobe, unchanged from 1 day prior. A somewhat lesser degree of similar patchy opacity noted in the left upper lobe. There is diffuse interstitial prominence in the bases, likely with mild interstitial edema. There is patchy airspace opacity in the right base. There are small pleural effusions bilaterally. There is cardiomegaly with mild pulmonary venous hypertension. There is aortic atherosclerosis. No evident adenopathy. No bone lesions. IMPRESSION: Evidence a degree of congestive heart failure. Areas of airspace opacity in both upper lobes and right base likely represent superimposed multifocal pneumonia. Both pneumonia and alveolar edema may be present concurrently. Appearance is similar compared to 1 day prior. There is aortic atherosclerosis. Aortic Atherosclerosis (ICD10-I70.0). Electronically Signed  By: Lowella Grip III M.D.   On: 06/07/2017 08:05   Ct Chest W Contrast  Result Date: 06/06/2017 CLINICAL DATA:  Cough for weeks. EXAM: CT CHEST WITH CONTRAST TECHNIQUE: Multidetector CT imaging of the chest was performed during intravenous contrast administration. CONTRAST:  75 cc Isovue 300 COMPARISON:  None. FINDINGS: Cardiovascular: The heart is enlarged. Coronary artery calcification is noted. Atherosclerotic calcification is noted in the wall of the thoracic aorta. Mediastinum/Nodes: 11 mm short axis subcarinal lymph node is associated with a 13 mm lymph node in the right hilum. The esophagus is  diffusely patulous and filled with fluid and debris. Lungs/Pleura: Interlobular septal thickening associated with central ground-glass opacity in the right upper lobes. There is patchy nodular airspace disease in the central right middle lobe with bibasilar collapse/consolidation and small bilateral pleural effusions. Upper Abdomen: Small nonobstructing stone noted right kidney. Musculoskeletal: Bone windows reveal no worrisome lytic or sclerotic osseous lesions. IMPRESSION: 1. Patchy bilateral airspace disease with bilateral upper lobe mosaic attenuation. Multifocal pneumonia likely with potential edema or hemorrhage in the upper lobes. 2. Bilateral pleural effusions, right greater than left. 3. Diffusely patulous esophagus filled with fluid and debris. 4. Mediastinal and right hilar lymphadenopathy, likely reactive. 5. Follow-up CT imaging recommended to ensure resolution of findings. Electronically Signed   By: Misty Stanley M.D.   On: 06/06/2017 17:45     Tye Savoy, NP-C @  06/07/2017, 9:07 AM  Pager number [phone]

## 2017-06-07 NOTE — Transfer of Care (Signed)
Immediate Anesthesia Transfer of Care Note  Patient: Robert Carey  Procedure(s) Performed: Procedure(s): ESOPHAGOGASTRODUODENOSCOPY (EGD) WITH PROPOFOL (N/A)  Patient Location: Endoscopy Unit  Anesthesia Type:MAC  Level of Consciousness: awake, alert  and oriented  Airway & Oxygen Therapy: Patient Spontanous Breathing and Patient connected to nasal cannula oxygen  Post-op Assessment: Report given to RN, Post -op Vital signs reviewed and stable and Patient moving all extremities X 4  Post vital signs: Reviewed and stable  Last Vitals:  Vitals:   06/07/17 0433 06/07/17 1021  BP: (!) 117/95 117/67  Pulse: 100 86  Resp: 18 11  Temp: 36.7 C 36.6 C    Last Pain:  Vitals:   06/07/17 1021  TempSrc: Oral  PainSc:          Complications: No apparent anesthesia complications

## 2017-06-07 NOTE — Interval H&P Note (Signed)
History and Physical Interval Note:  06/07/2017 10:58 AM  Robert Carey  has presented today for surgery, with the diagnosis of partial food impaction  The various methods of treatment have been discussed with the patient and family. After consideration of risks, benefits and other options for treatment, the patient has consented to  Procedure(s): ESOPHAGOGASTRODUODENOSCOPY (EGD) WITH PROPOFOL (N/A) as a surgical intervention .  The patient's history has been reviewed, patient examined, no change in status, stable for surgery.  I have reviewed the patient's chart and labs.  Questions were answered to the patient's satisfaction.     Renelda Loma Armbruster

## 2017-06-07 NOTE — Op Note (Signed)
[facility] Patient Name: Robert Carey Procedure Date : 06/07/2017 MRN: [id number] Attending MD: Carlota Raspberry. Landrie Beale MD, MD Date of Birth: 03/27/1946 CSN: [id number] Age: 71 Admit Type: Inpatient Procedure:                Upper GI endoscopy Indications:              Dysphagia Providers:                Remo Lipps P. Daja Shuping MD, MD, Cleda Daub, RN,                            William Dalton, Technician Referring MD:              Medicines:                Monitored Anesthesia Care Complications:            No immediate complications. Estimated blood loss:                            Minimal. Estimated Blood Loss:     Estimated blood loss was minimal. Procedure:                Pre-Anesthesia Assessment:                           - Prior to the procedure, a History and Physical                            was performed, and patient medications and                            allergies were reviewed. The patient's tolerance of                            previous anesthesia was also reviewed. The risks                            and benefits of the procedure and the sedation                            options and risks were discussed with the patient.                            All questions were answered, and informed consent                            was obtained. Prior Anticoagulants: The patient has                            taken no previous anticoagulant or antiplatelet                            agents. ASA Grade Assessment: III - A patient with  severe systemic disease. After reviewing the risks                            and benefits, the patient was deemed in                            satisfactory condition to undergo the procedure.                           After obtaining informed consent, the endoscope was                            passed under direct vision. Throughout the                            procedure, the patient's blood  pressure, pulse, and                            oxygen saturations were monitored continuously. The                            [id number] ([id number]) scope was introduced through the                            mouth, and advanced to the second part of duodenum.                            The upper GI endoscopy was accomplished without                            difficulty. The patient tolerated the procedure                            well. Scope In: Scope Out: Findings:      Esophagogastric landmarks were identified: the Z-line was found at 40 cm       from the incisors.      Residual secretions / small residual food remnants was found lining the       entire esophagus. There was no impaction present. The GEJ was widely       patent without any evidence of stricture. The esophagus was mildly       dilated.      The exam of the esophagus was otherwise normal.      A guidewire was placed and the scope was withdrawn. Dilation was       initially attempted using 40m Savory however resistance was appreciated       with prohibited passage of the dilator. Thus a 160mand then 1596mavory       dilatory was placed through the entire esophagus with mild resistance.      Two diminutive sessile polyps were found in the cardia. Biopsies were       taken with a cold forceps for histology.      The exam of the stomach was otherwise normal. The LES was widely patent       and patulous on retroflexed views.  The duodenal bulb and second portion of the duodenum were normal. Impression:               - Esophagogastric landmarks identified.                           - Residual secretions lining the entire esosphagus,                            consistent with severe dysmotility. No obvious                            stricture noted in the distal esopohagus                           - Empiric dilation performed to 56m using Savory                            dilator, large dilation met with resistance -                             suspect the patient could have UES stenosis                            contributing to aspiration as well                           - Two gastric polyps. Biopsied.                           - Normal duodenal bulb and second portion of the                            duodenum.                           Overall, the patient has evidence of severe                            dysmotility on this exam which is likely causing                            his symptoms. I suspect there is a component of                            cricopharyngeal bar or UES stenosis contributing to                            dysphagia as well. Moderate Sedation:      No moderate sedation, case performed with MAC Recommendation:           - Return patient to hospital ward for ongoing care.                           - Advance diet as tolerated post dilation.                           -  Continue present medications.                           - Recommend modified barium swallow to further                            assess for aspiration risk and UES functioning.                           - May also consider complete barium swallow /                            manometry in the future to ensure no evidence of                            achalasia                           - Await pathology results                           - Sleep with head of bed elevated, no food within 4                            hours of bedtime                           - GI will continue to follow Procedure Code(s):        --- Professional ---                           [phone], Esophagogastroduodenoscopy, flexible,                            transoral; with insertion of guide wire followed by                            passage of dilator(s) through esophagus over guide                            wire                           [id number], Esophagogastroduodenoscopy, flexible,                            transoral; with biopsy,  single or multiple Diagnosis Code(s):        --- Professional ---                           P54.656C, Food in esophagus causing other injury,                            initial encounter                           K31.7, Polyp  of stomach and duodenum                           R13.10, Dysphagia, unspecified CPT copyright 2016 [employer] Association. All rights reserved. The codes documented in this report are preliminary and upon coder review may  be revised to meet current compliance requirements. Remo Lipps P. Makell Cyr MD, MD 06/07/2017 12:36:01 PM This report has been signed electronically. Number of Addenda: 0

## 2017-06-07 NOTE — Progress Notes (Signed)
Hypoglycemic Event  CBG: 59  Treatment: D50 IV 25 mL  Symptoms: None  Follow-up CBG: Time: 0900 CBG Result: 101  Possible Reasons for Event: Inadequate meal intake    Sherren Kerns

## 2017-06-07 NOTE — Anesthesia Procedure Notes (Signed)
Procedure Name: MAC Date/Time: 06/07/2017 11:53 AM Performed by: Neldon Newport Pre-anesthesia Checklist: Timeout performed, Patient being monitored, Suction available, Emergency Drugs available and Patient identified Oxygen Delivery Method: Nasal cannula Placement Confirmation: positive ETCO2 Dental Injury: Teeth and Oropharynx as per pre-operative assessment

## 2017-06-07 NOTE — Progress Notes (Signed)
Patient ID: Robert Carey, male   DOB: [birth year], 71 y.o.   MRN: [id number]  Left thoracentesis performed in IR today 110 cc fluid  CXR post procedure: IMPRESSION: Tiny left apical pneumothorax. Findings are worrisome for a stable pulmonary edema.   Called to RN She states pt is completely asymptomatic denies SOB or pain  Will place pt on 2L 02 now Check cxr in am

## 2017-06-08 ENCOUNTER — Observation Stay (HOSPITAL_COMMUNITY): Payer: PPO

## 2017-06-08 DIAGNOSIS — J90 Pleural effusion, not elsewhere classified: Secondary | ICD-10-CM | POA: Diagnosis present

## 2017-06-08 DIAGNOSIS — G40909 Epilepsy, unspecified, not intractable, without status epilepticus: Secondary | ICD-10-CM | POA: Diagnosis present

## 2017-06-08 DIAGNOSIS — M341 CR(E)ST syndrome: Secondary | ICD-10-CM | POA: Diagnosis present

## 2017-06-08 DIAGNOSIS — I739 Peripheral vascular disease, unspecified: Secondary | ICD-10-CM | POA: Diagnosis present

## 2017-06-08 DIAGNOSIS — Z7982 Long term (current) use of aspirin: Secondary | ICD-10-CM | POA: Diagnosis not present

## 2017-06-08 DIAGNOSIS — F0390 Unspecified dementia without behavioral disturbance: Secondary | ICD-10-CM | POA: Diagnosis present

## 2017-06-08 DIAGNOSIS — K219 Gastro-esophageal reflux disease without esophagitis: Secondary | ICD-10-CM | POA: Diagnosis present

## 2017-06-08 DIAGNOSIS — K449 Diaphragmatic hernia without obstruction or gangrene: Secondary | ICD-10-CM | POA: Diagnosis present

## 2017-06-08 DIAGNOSIS — M351 Other overlap syndromes: Secondary | ICD-10-CM | POA: Diagnosis present

## 2017-06-08 DIAGNOSIS — L219 Seborrheic dermatitis, unspecified: Secondary | ICD-10-CM | POA: Diagnosis present

## 2017-06-08 DIAGNOSIS — Z9889 Other specified postprocedural states: Secondary | ICD-10-CM | POA: Diagnosis not present

## 2017-06-08 DIAGNOSIS — Z888 Allergy status to other drugs, medicaments and biological substances status: Secondary | ICD-10-CM | POA: Diagnosis not present

## 2017-06-08 DIAGNOSIS — R1312 Dysphagia, oropharyngeal phase: Secondary | ICD-10-CM | POA: Diagnosis present

## 2017-06-08 DIAGNOSIS — T18128A Food in esophagus causing other injury, initial encounter: Secondary | ICD-10-CM | POA: Diagnosis not present

## 2017-06-08 DIAGNOSIS — K317 Polyp of stomach and duodenum: Secondary | ICD-10-CM | POA: Diagnosis present

## 2017-06-08 DIAGNOSIS — J918 Pleural effusion in other conditions classified elsewhere: Secondary | ICD-10-CM | POA: Diagnosis not present

## 2017-06-08 DIAGNOSIS — Z7952 Long term (current) use of systemic steroids: Secondary | ICD-10-CM | POA: Diagnosis not present

## 2017-06-08 DIAGNOSIS — Z8249 Family history of ischemic heart disease and other diseases of the circulatory system: Secondary | ICD-10-CM | POA: Diagnosis not present

## 2017-06-08 DIAGNOSIS — K3184 Gastroparesis: Secondary | ICD-10-CM | POA: Diagnosis present

## 2017-06-08 DIAGNOSIS — J9383 Other pneumothorax: Secondary | ICD-10-CM | POA: Diagnosis not present

## 2017-06-08 DIAGNOSIS — B941 Sequelae of viral encephalitis: Secondary | ICD-10-CM | POA: Diagnosis not present

## 2017-06-08 DIAGNOSIS — M459 Ankylosing spondylitis of unspecified sites in spine: Secondary | ICD-10-CM | POA: Diagnosis present

## 2017-06-08 DIAGNOSIS — Z79899 Other long term (current) drug therapy: Secondary | ICD-10-CM | POA: Diagnosis not present

## 2017-06-08 DIAGNOSIS — R1310 Dysphagia, unspecified: Secondary | ICD-10-CM | POA: Diagnosis present

## 2017-06-08 DIAGNOSIS — J449 Chronic obstructive pulmonary disease, unspecified: Secondary | ICD-10-CM | POA: Diagnosis not present

## 2017-06-08 DIAGNOSIS — K224 Dyskinesia of esophagus: Secondary | ICD-10-CM | POA: Diagnosis not present

## 2017-06-08 DIAGNOSIS — J690 Pneumonitis due to inhalation of food and vomit: Secondary | ICD-10-CM | POA: Diagnosis present

## 2017-06-08 DIAGNOSIS — K222 Esophageal obstruction: Secondary | ICD-10-CM | POA: Diagnosis present

## 2017-06-08 DIAGNOSIS — E874 Mixed disorder of acid-base balance: Secondary | ICD-10-CM | POA: Diagnosis present

## 2017-06-08 DIAGNOSIS — K5730 Diverticulosis of large intestine without perforation or abscess without bleeding: Secondary | ICD-10-CM | POA: Diagnosis present

## 2017-06-08 LAB — BASIC METABOLIC PANEL
Anion gap: 8 (ref 5–15)
BUN: 15 mg/dL (ref 6–20)
CALCIUM: 8.6 mg/dL — AB (ref 8.9–10.3)
CHLORIDE: 111 mmol/L (ref 101–111)
CO2: 20 mmol/L — AB (ref 22–32)
Creatinine, Ser: 1.02 mg/dL (ref 0.61–1.24)
Glucose, Bld: 109 mg/dL — ABNORMAL HIGH (ref 65–99)
POTASSIUM: 3.4 mmol/L — AB (ref 3.5–5.1)
SODIUM: 139 mmol/L (ref 135–145)

## 2017-06-08 LAB — CBC
HCT: 38.7 % — ABNORMAL LOW (ref 39.0–52.0)
HEMOGLOBIN: 12.7 g/dL — AB (ref 13.0–17.0)
MCH: 27.9 pg (ref 26.0–34.0)
MCHC: 32.8 g/dL (ref 30.0–36.0)
MCV: 84.9 fL (ref 78.0–100.0)
PLATELETS: 223 10*3/uL (ref 150–400)
RBC: 4.56 MIL/uL (ref 4.22–5.81)
RDW: 16.2 % — AB (ref 11.5–15.5)
WBC: 7.1 10*3/uL (ref 4.0–10.5)

## 2017-06-08 MED ORDER — LEVETIRACETAM 500 MG PO TABS
500.0000 mg | ORAL_TABLET | ORAL | Status: DC
  Administered 2017-06-08: 500 mg via ORAL
  Filled 2017-06-08: qty 1

## 2017-06-08 MED ORDER — LEVETIRACETAM 100 MG/ML PO SOLN: 750.0000 mg | ORAL | Status: DC

## 2017-06-08 MED ORDER — LEVETIRACETAM 750 MG PO TABS
750.0000 mg | ORAL_TABLET | ORAL | Status: DC
  Administered 2017-06-08: 750 mg via ORAL
  Filled 2017-06-08: qty 1

## 2017-06-08 MED ORDER — LEVETIRACETAM 250 MG PO TABS
375.0000 mg | ORAL_TABLET | ORAL | Status: DC
  Administered 2017-06-09: 375 mg via ORAL
  Filled 2017-06-08: qty 1.5

## 2017-06-08 MED ORDER — LEVETIRACETAM 750 MG PO TABS
1500.0000 mg | ORAL_TABLET | ORAL | Status: DC
  Administered 2017-06-09: 1500 mg via ORAL
  Filled 2017-06-08: qty 2

## 2017-06-08 MED ORDER — LEVETIRACETAM 100 MG/ML PO SOLN
375.0000 mg | ORAL | Status: DC
  Administered 2017-06-08: 380 mg via ORAL
  Filled 2017-06-08: qty 5

## 2017-06-08 MED ORDER — LEVETIRACETAM 100 MG/ML PO SOLN: 500.0000 mg | ORAL | Status: DC

## 2017-06-08 MED ORDER — LEVETIRACETAM 100 MG/ML PO SOLN: 1500.0000 mg | ORAL | Status: DC

## 2017-06-08 NOTE — Progress Notes (Signed)
[facility] Progress Note  Subjective:  EGD 7/20 showed the following:  - Residual secretions lining the entire esosphagus, consistent with severe dysmotility. No obvious stricture noted in the distal esopohagus - Empiric dilation performed to 72m using Savory dilator, large dilation met with resistance - suspect the patient could have UES stenosis contributing to aspiration as well - Two gastric polyps. Biopsied. - Normal duodenal bulb and second portion of the duodenum. *Overall, the patient has evidence of severe dysmotility on this exam which is likely causing his symptoms. I suspect there is a component of cricopharyngeal bar or UES stenosis contributing to dysphagia as well.  MBSS ordered and just completed.  He says that they EGD with small amount of dilation yesterday actually did help his swallowing some.  Objective:  Vital signs in last 24 hours: Temp:  [97.7 F (36.5 C)-98.1 F (36.7 C)] 98.1 F (36.7 C) (07/21 0623) Pulse Rate:  [64-86] 86 (07/21 0623) Resp:  [18-32] 18 (07/21 0623) BP: (93-112)/(60-72) 103/65 (07/21 0623) SpO2:  [94 %-99 %] 97 % (07/21 0623) Last BM Date: 06/07/17 General:  Alert, Well-developed, in NAD Heart:  Regular rate and rhythm; no murmurs Pulm:  CTAB.  No increased WOB. Abdomen:  Soft, non-distended.  BS present.  Non-tender.  Extremities:  Without edema. Neurologic:  Alert and oriented x 4;  grossly normal neurologically. Psych:  Alert and cooperative. Normal mood and affect.  Intake/Output from previous day: 07/20 0701 - 07/21 0700 In: 1203 [P.O.:480; I.V.:723] Out: 5 [Blood:5] Intake/Output this shift: Total I/O In: 180 [P.O.:180] Out: -   Lab Results:  Recent Labs  06/06/17 1610 06/07/17 0431 06/08/17 0927  WBC 8.7 8.7 7.1  HGB 11.7* 11.7* 12.7*  HCT 36.3* 36.6* 38.7*  PLT 191 183 223   BMET  Recent Labs  06/06/17 1610 06/07/17 0431 06/08/17 0927  NA 140 142 139  K 3.9 3.5 3.4*  CL 111 113*  111  CO2 18* 16* 20*  GLUCOSE 94 65 109*  BUN 20 17 15   CREATININE 1.02 0.99 1.02  CALCIUM 8.6* 8.4* 8.6*   LFT  Recent Labs  06/06/17 1610  PROT 6.5  ALBUMIN 3.0*  AST 52*  ALT 25  ALKPHOS 59  BILITOT 0.4   PT/INR  Recent Labs  06/07/17 0431  LABPROT 15.9*  INR 1.26   Dg Chest 1 View  Result Date: 06/07/2017 CLINICAL DATA:  Status post left thoracentesis. EXAM: CHEST 1 VIEW COMPARISON:  06/07/2017 700 hours FINDINGS: The heart remains moderately enlarged. Hazy diffuse pulmonary opacities throughout both lungs worrisome for pulmonary edema are stable. Left pleural effusion is stable. Tiny right pleural effusion is stable. Tiny less than 5% left apical pneumothorax. IMPRESSION: Tiny left apical pneumothorax. Findings are worrisome for a stable pulmonary edema. Electronically Signed   By: AMarybelle KillingsM.D.   On: 06/07/2017 16:04   Dg Chest 2 View  Result Date: 06/07/2017 CLINICAL DATA:  Shortness of breath and cough EXAM: CHEST  2 VIEW COMPARISON:  Chest radiograph May 30, 2017; chest CT June 06, 2017 FINDINGS: There is patchy airspace opacity in the right upper lobe, unchanged from 1 day prior. A somewhat lesser degree of similar patchy opacity noted in the left upper lobe. There is diffuse interstitial prominence in the bases, likely with mild interstitial edema. There is patchy airspace opacity in the right base. There are small pleural effusions bilaterally. There is cardiomegaly with mild pulmonary venous hypertension. There is aortic atherosclerosis. No evident adenopathy.  No bone lesions. IMPRESSION: Evidence a degree of congestive heart failure. Areas of airspace opacity in both upper lobes and right base likely represent superimposed multifocal pneumonia. Both pneumonia and alveolar edema may be present concurrently. Appearance is similar compared to 1 day prior. There is aortic atherosclerosis. Aortic Atherosclerosis (ICD10-I70.0). Electronically Signed   By: Lowella Grip III M.D.   On: 06/07/2017 08:05   Ct Chest W Contrast  Result Date: 06/06/2017 CLINICAL DATA:  Cough for weeks. EXAM: CT CHEST WITH CONTRAST TECHNIQUE: Multidetector CT imaging of the chest was performed during intravenous contrast administration. CONTRAST:  75 cc Isovue 300 COMPARISON:  None. FINDINGS: Cardiovascular: The heart is enlarged. Coronary artery calcification is noted. Atherosclerotic calcification is noted in the wall of the thoracic aorta. Mediastinum/Nodes: 11 mm short axis subcarinal lymph node is associated with a 13 mm lymph node in the right hilum. The esophagus is diffusely patulous and filled with fluid and debris. Lungs/Pleura: Interlobular septal thickening associated with central ground-glass opacity in the right upper lobes. There is patchy nodular airspace disease in the central right middle lobe with bibasilar collapse/consolidation and small bilateral pleural effusions. Upper Abdomen: Small nonobstructing stone noted right kidney. Musculoskeletal: Bone windows reveal no worrisome lytic or sclerotic osseous lesions. IMPRESSION: 1. Patchy bilateral airspace disease with bilateral upper lobe mosaic attenuation. Multifocal pneumonia likely with potential edema or hemorrhage in the upper lobes. 2. Bilateral pleural effusions, right greater than left. 3. Diffusely patulous esophagus filled with fluid and debris. 4. Mediastinal and right hilar lymphadenopathy, likely reactive. 5. Follow-up CT imaging recommended to ensure resolution of findings. Electronically Signed   By: Misty Stanley M.D.   On: 06/06/2017 17:45   Dg Chest Port 1 View  Result Date: 06/08/2017 CLINICAL DATA:  Pneumothorax EXAM: PORTABLE CHEST 1 VIEW COMPARISON:  06/07/2017 FINDINGS: Left apical linear density is unchanged and may represent pleural calcification based on review of the CT of 06/06/2017. No pneumothorax is seen on the CT. Similar linear calcifications also present in the right apex. There is a  pleural thickening and scarring bilaterally. Cardiac enlargement. Improved aeration in the lungs bilaterally with decrease in diffuse bilateral airspace disease. Possible clearing edema. Persistent bilateral upper lobe airspace disease could represent edema or pneumonia. Small bilateral pleural effusions. Improvement in bibasilar atelectasis IMPRESSION: No pneumothorax identified. Left apical linear density felt to be pleural calcification based on review of prior CT. COPD. Improvement in bilateral airspace disease likely due to clearing edema. Improvement in bibasilar atelectasis. Electronically Signed   By: Franchot Gallo M.D.   On: 06/08/2017 08:03   Ir Thoracentesis Asp Pleural Space W/img Guide  Result Date: 06/07/2017 INDICATION: Patient with aspiration pneumonia, pleural effusions. Request is made for diagnostic left thoracentesis. EXAM: ULTRASOUND GUIDED LEFT DIAGNOSTIC THORACENTESIS MEDICATIONS: 10 mL 1% lidocaine COMPLICATIONS: None immediate. PROCEDURE: An ultrasound guided thoracentesis was thoroughly discussed with the patient and questions answered. The benefits, risks, alternatives and complications were also discussed. The patient understands and wishes to proceed with the procedure. Written consent was obtained. Ultrasound was performed to localize and mark an adequate pocket of fluid in the left chest. The area was then prepped and draped in the normal sterile fashion. 1% Lidocaine was used for local anesthesia. Under ultrasound guidance a Safe-T-Centesis catheter was introduced. Thoracentesis was performed. The catheter was removed and a dressing applied. FINDINGS: A total of approximately 110 mL of blood-tinged fluid was removed. Samples were sent to the laboratory as requested by the clinical team. IMPRESSION: Successful  ultrasound guided diagnostic line thoracentesis yielding 110 mL of pleural fluid. Read by:  Brynda Greathouse PA-C Electronically Signed   By: Jacqulynn Cadet M.D.   On:  06/07/2017 15:51   Assessment / Plan: 1. 71 yo male with recurrent dysphagia and fluid / debris filled esophagus on chest CT scan. 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 -EGD 7/20 showed severe dysmotility without obvious stricture, but dilation was performed with resistance.  ? UES stenosis.  MBSS ordered, further recommedations pending those results.  2. PNA, Likely aspiration related to poor esophageal clearance. On Augmentin.  3. 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 He has short term memory loss. Followed at [facility]. Last seizure reportedly in April or May. On Keppra and Trileptal.  4. CREST syndrome  5. Ankylosing spondylitis   LOS: 0 days   ZEHR, JESSICA D.  06/08/2017, 10:51 AM  Pager number [phone]

## 2017-06-08 NOTE — Progress Notes (Signed)
Subjective: [name] reported doing fine this morning. He was sitting in his chair reading the newspaper, and his wife was at bedside. He endorsed a mild, chronic cough and continued SOB. These symptoms have not changed from admission. After his EGD yesterday, [name] reported that he can eat better. He had a small meal for dinner yesterday night and had no issues. He is going to "take it easy" with smaller meals and smaller bites because he does not want to worsen his dysphagia. He was also able to sleep through the night without coughing spells.  Objective: Vital signs in last 24 hours: Vitals:   06/07/17 1300 06/07/17 1349 06/07/17 2113 06/08/17 0623  BP: 112/72 103/60 104/72 103/65  Pulse: 64 65 84 86  Resp: (!) 32 (!) 24 18 18   Temp:  97.7 F (36.5 C) 97.7 F (36.5 C) 98.1 F (36.7 C)  TempSrc:  Oral    SpO2: 96%   97%  Weight:      Height:        Intake/Output Summary (Last 24 hours) at 06/08/17 1052 Last data filed at 06/08/17 1002  Gross per 24 hour  Intake             1383 ml  Output                5 ml  Net             1378 ml   BP 103/65 (BP Location: Right Arm)   Pulse 86   Temp 98.1 F (36.7 C)   Resp 18   Ht 5\' 11"  (1.803 m)   Wt 83 kg (183 lb)   SpO2 97%   BMI 25.52 kg/m  General appearance: alert, cooperative and appears stated age Lungs: Mildly increased work of breathing. Clear lungs and breath sounds. Heart: regular rate and rhythm, S1, S2 normal, no murmur, click, rub or gallop Abdomen: soft, non-tender; bowel sounds normal; no masses,  no organomegaly Extremities: extremities normal, atraumatic, no cyanosis or edema and bilat hand deformities Neurologic: Grossly normal Lab Results: Na 139 K 3.4 Cl 111 CO2 20 Glu 109 BUN 15 Cr 1.02 Ca 8.6 Hg/HCT 12.7/38.7 Micro Results: Recent Results (from the past 240 hour(s))  Gram stain     Status: None   Collection Time: 06/07/17  3:58 PM  Result Value Ref Range Status   Specimen Description PLEURAL  LEFT  Final   Special Requests NONE  Final   Gram Stain   Final    MODERATE WBC PRESENT,BOTH PMN AND MONONUCLEAR NO ORGANISMS SEEN    Report Status 06/07/2017 FINAL  Final   Studies/Results: Dg Chest 1 View  Result Date: 06/07/2017 CLINICAL DATA:  Status post left thoracentesis. EXAM: CHEST 1 VIEW COMPARISON:  06/07/2017 700 hours FINDINGS: The heart remains moderately enlarged. Hazy diffuse pulmonary opacities throughout both lungs worrisome for pulmonary edema are stable. Left pleural effusion is stable. Tiny right pleural effusion is stable. Tiny less than 5% left apical pneumothorax. IMPRESSION: Tiny left apical pneumothorax. Findings are worrisome for a stable pulmonary edema. Electronically Signed   By: Marybelle Killings M.D.   On: 06/07/2017 16:04   Dg Chest 2 View  Result Date: 06/07/2017 CLINICAL DATA:  Shortness of breath and cough EXAM: CHEST  2 VIEW COMPARISON:  Chest radiograph May 30, 2017; chest CT June 06, 2017 FINDINGS: There is patchy airspace opacity in the right upper lobe, unchanged from 1 day prior. A somewhat lesser degree of similar patchy  opacity noted in the left upper lobe. There is diffuse interstitial prominence in the bases, likely with mild interstitial edema. There is patchy airspace opacity in the right base. There are small pleural effusions bilaterally. There is cardiomegaly with mild pulmonary venous hypertension. There is aortic atherosclerosis. No evident adenopathy. No bone lesions. IMPRESSION: Evidence a degree of congestive heart failure. Areas of airspace opacity in both upper lobes and right base likely represent superimposed multifocal pneumonia. Both pneumonia and alveolar edema may be present concurrently. Appearance is similar compared to 1 day prior. There is aortic atherosclerosis. Aortic Atherosclerosis (ICD10-I70.0). Electronically Signed   By: Lowella Grip III M.D.   On: 06/07/2017 08:05   Ct Chest W Contrast  Result Date: 06/06/2017 CLINICAL  DATA:  Cough for weeks. EXAM: CT CHEST WITH CONTRAST TECHNIQUE: Multidetector CT imaging of the chest was performed during intravenous contrast administration. CONTRAST:  75 cc Isovue 300 COMPARISON:  None. FINDINGS: Cardiovascular: The heart is enlarged. Coronary artery calcification is noted. Atherosclerotic calcification is noted in the wall of the thoracic aorta. Mediastinum/Nodes: 11 mm short axis subcarinal lymph node is associated with a 13 mm lymph node in the right hilum. The esophagus is diffusely patulous and filled with fluid and debris. Lungs/Pleura: Interlobular septal thickening associated with central ground-glass opacity in the right upper lobes. There is patchy nodular airspace disease in the central right middle lobe with bibasilar collapse/consolidation and small bilateral pleural effusions. Upper Abdomen: Small nonobstructing stone noted right kidney. Musculoskeletal: Bone windows reveal no worrisome lytic or sclerotic osseous lesions. IMPRESSION: 1. Patchy bilateral airspace disease with bilateral upper lobe mosaic attenuation. Multifocal pneumonia likely with potential edema or hemorrhage in the upper lobes. 2. Bilateral pleural effusions, right greater than left. 3. Diffusely patulous esophagus filled with fluid and debris. 4. Mediastinal and right hilar lymphadenopathy, likely reactive. 5. Follow-up CT imaging recommended to ensure resolution of findings. Electronically Signed   By: Misty Stanley M.D.   On: 06/06/2017 17:45   Dg Chest Port 1 View  Result Date: 06/08/2017 CLINICAL DATA:  Pneumothorax EXAM: PORTABLE CHEST 1 VIEW COMPARISON:  06/07/2017 FINDINGS: Left apical linear density is unchanged and may represent pleural calcification based on review of the CT of 06/06/2017. No pneumothorax is seen on the CT. Similar linear calcifications also present in the right apex. There is a pleural thickening and scarring bilaterally. Cardiac enlargement. Improved aeration in the lungs  bilaterally with decrease in diffuse bilateral airspace disease. Possible clearing edema. Persistent bilateral upper lobe airspace disease could represent edema or pneumonia. Small bilateral pleural effusions. Improvement in bibasilar atelectasis IMPRESSION: No pneumothorax identified. Left apical linear density felt to be pleural calcification based on review of prior CT. COPD. Improvement in bilateral airspace disease likely due to clearing edema. Improvement in bibasilar atelectasis. Electronically Signed   By: Franchot Gallo M.D.   On: 06/08/2017 08:03   Ir Thoracentesis Asp Pleural Space W/img Guide  Result Date: 06/07/2017 INDICATION: Patient with aspiration pneumonia, pleural effusions. Request is made for diagnostic left thoracentesis. EXAM: ULTRASOUND GUIDED LEFT DIAGNOSTIC THORACENTESIS MEDICATIONS: 10 mL 1% lidocaine COMPLICATIONS: None immediate. PROCEDURE: An ultrasound guided thoracentesis was thoroughly discussed with the patient and questions answered. The benefits, risks, alternatives and complications were also discussed. The patient understands and wishes to proceed with the procedure. Written consent was obtained. Ultrasound was performed to localize and mark an adequate pocket of fluid in the left chest. The area was then prepped and draped in the normal sterile fashion. 1% Lidocaine  was used for local anesthesia. Under ultrasound guidance a Safe-T-Centesis catheter was introduced. Thoracentesis was performed. The catheter was removed and a dressing applied. FINDINGS: A total of approximately 110 mL of blood-tinged fluid was removed. Samples were sent to the laboratory as requested by the clinical team. IMPRESSION: Successful ultrasound guided diagnostic line thoracentesis yielding 110 mL of pleural fluid. Read by:  Brynda Greathouse PA-C Electronically Signed   By: Jacqulynn Cadet M.D.   On: 06/07/2017 15:51   Medications: I have reviewed the patient's current medications. Scheduled  Meds: . amoxicillin-clavulanate  1 tablet Oral Q12H  . aspirin EC  81 mg Oral Daily  . celecoxib  200 mg Oral Daily  . enoxaparin (LOVENOX) injection  40 mg Subcutaneous Q24H  . hydroxychloroquine  200 mg Oral BID WC  . levETIRAcetam  1,500 mg Oral QAC breakfast   And  . levETIRAcetam  375 mg Oral Q1200   And  . levETIRAcetam  500 mg Oral Q24H   And  . levETIRAcetam  750 mg Oral Q24H  . NIFEdipine  60 mg Oral QPM  . OXcarbazepine  300 mg Oral Q24H   And  . OXcarbazepine  450 mg Oral Q24H  . pantoprazole  40 mg Oral Daily  . predniSONE  5 mg Oral Q breakfast   Continuous Infusions: PRN Meds:.acetaminophen Assessment/Plan: Active Problems:   Aspiration pneumonia (HCC)   Dysphagia  Dysphagia Bilateral Pleural Effusion Aspiration Pneumonia Aspiration pneumonia likely d/t poor esophageal clearance. S/p EGD and L thoracentesis 7/20. CXR this AM showed stable bilat pleura effusion and small L apical pneumothorax. Pt better able to tolerate food after esophageal dilation. Decreased coughing overnight. Labs stable. CO2 improved. -Modified barium swallow ordered -Path report of stomach polyps (2) pending -Pleural fluid culture pending - transudative  -Gram stain: Mod WBC present but no organisms seen  -Protein < 3.0  -LDH 72 (serum 212) -Continue Augmentin -CBC + BMP in AM -Appreciate GI recommendations -NPO 4 hrs before bed, sleep at 30 degrees in bed     Crest syndrome -Continue home celecoxib 200mg  qd, plaquenil 200mg  bid, nifedipine 60mg , and prednisone 5mg   Seizure disorder 2/2 viral encephalitis -Continue home Keppra regimen of 1500mg  at 8am, 375mg  at 12pm, 500mg  at 7pm, and 750mg  at 9pm as per neurologist -Continue home Trileptal 300mg  at 8am and 450mg  at 9pm   Dispo: Anticipated discharge in approximately 0-1 day.   This is a Careers information officer Note.  The care of the patient was discussed with Dr. Dareen Piano and the assessment and plan formulated with their  assistance.  Please see their attached note for official documentation of the daily encounter.   LOS: 0 days   Murlean Caller, Medical Student 06/08/2017, 10:52 AM  Pager: [phone]

## 2017-06-08 NOTE — Progress Notes (Signed)
   Subjective: He is sitting comfortably in the bedside chair reading newspaper. He underwent EGD yesterday with mild empiric balloon dilatation. Thoracentesis was performed without symptomatic complaints. He slept well overnight without any severe coughing. Ate his breakfast completely without problems. He is anticipating a swallowing study today.  Objective:  Vital signs in last 24 hours: Vitals:   06/07/17 1300 06/07/17 1349 06/07/17 2113 06/08/17 0623  BP: 112/72 103/60 104/72 103/65  Pulse: 64 65 84 86  Resp: (!) 32 (!) 24 18 18   Temp:  97.7 F (36.5 C) 97.7 F (36.5 C) 98.1 F (36.7 C)  TempSrc:  Oral    SpO2: 96%   97%  Weight:      Height:       Physical Exam  Constitutional: He appears well-developed and well-nourished.  HENT:  Head: Normocephalic and atraumatic.  Mouth/Throat: Oropharynx is clear and moist. No oropharyngeal exudate.  Cardiovascular: Normal rate and regular rhythm.   Pulmonary/Chest: Tachypnea noted. No respiratory distress.  Slightly shallow inspirations, very mild tachypnea, normal WOB Bilateral inspiratory crackles with good air movement  Abdominal: Soft. He exhibits no distension. There is no tenderness.  Musculoskeletal:  Grossly restricted ROM in neck, shoulders, and back    Assessment/Plan: Bilateral Pleural Effusion Aspiration Pneumonia Pleural fluid analysis indicates transudative fluid less concerning for parapneumonic effusion. We will continue his augmentin as the treatment duration may need to be longer if continued aspiration insults were ongoing prior to this assessment. Compensatory metabolic acidosis is improving along with the tachypnea. -Augmentin 875-125mg  BID, tentatively through 7/25 -F/U pleural fluid culture  Dysphagia Esophageal dysmotility EGD on 7/20 indicated dysmotility without new stricture formation treated with empiric mild balloon dilatation of UES. This could also be a consequence of his connective tissue disease.    -Swallowing study today -Elevated head of bed > 30 degrees -F/U stomach biopsies  Seizure disorder Longstanding problem since viral encephalitis in 2004(?). Last seizure reported several months ago this year. HE is on an atypical Keppra regimen that is controlling symptoms better per outpatient neurologist. -Continue keppra regimen of 1500mg  at 8am, 375mg  at 12pm, 500mg  at 7pm, and 750mg  at 9pm -Continue trileptal 300mg  at 8am and 450mg  at 9pm  Dispo: Anticipated discharge in approximately 1-2 days.   Collier Salina, MD PGY-III Internal Medicine Resident Pager# [phone] 06/08/2017, 1:02 PM

## 2017-06-08 NOTE — Progress Notes (Signed)
Pt. just got back from barium swallow and pills have to be crush in applesauce. Text paged Dr. Charlies Silvers to inform that keppra can't be broken or crushed and pt. unable to take elixir b/c it's generic, need IV form.  Will await for return call or new orders.  Alphonzo Lemmings, RN

## 2017-06-08 NOTE — Progress Notes (Signed)
Modified Barium Swallow Progress Note  Patient Details  Name: Robert Carey MRN: [id number] Date of Birth: October 28, 1946  Today's Date: 06/08/2017  Modified Barium Swallow completed.  Full report located under Chart Review in the Imaging Section.  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 Deviations in swallow physiology include reduced amplitude/duration of UES opening and appearance of thickening of the cervical esophagus, which restricted bolus flow resulting in consistent mod-severe residual in the valleculae, pyriform sinuses and CP segment, solids>liquids. Oral stage of swallowing unremarkable. Swallow initiation appears WFL at the level of the valleculae. Intermittent delay to level of pyriform sinuses with thin liquids, resulting in flash penetration x1. No overt aspiration observed, however pt did cough x1 between frames; suspect reduced airway protection given amount of pharyngeal residue.Trialed chin tuck which appeared to facilitate slightly improved esophageal opening, reducing amount of residual. Cued subsequent dry swallow in chin tuck position for functional clearance of remaining pharyngeal residue. Attempted [facility], however pt was unable to perform accurately despite cues. Barium tablet retained in the valleculae, ultimately clearing after several attempts at effortful swallow + chin tuck, liquid wash. An esophageal sweep performed in the upright postion with barium tablet and thin liquid wash revealed a standing column of barium in the middle esophagus. Upon concluding the exam, SLP provided education to pt and his wife re: compensatory techniques should this improve symptoms. Approx 5 min after the exam ended, pt with delayed coughing episode; ? reflux given history. Recommend dys 3 with thin liquids, medications  crushed in puree, strict reflux precautions. Pt should use chin tuck with solids and liquids, swallow 2 times per bolus. Upright during and for 30 minutes after meals. Alternate liquids and solids; may benefit from room temperature fluids vs. cold liquids with meals. Recommend brief SLP f/u for tolerance and education/training in strategies. Pt will likely need supervision with meals in order to cue for swallowing precautions given short-term memory loss. He remains at moderate risk for aspiration 2/2 primary esophageal dysphagia, possible aspiration of gastric contents.    Swallow Evaluation Recommendations   Recommended Consults: Consider esophageal assessment (pt may benefit from manometry)   SLP Diet Recommendations: Dysphagia 3 (Mech soft) solids;Thin liquid   Liquid Administration via: Cup;Straw   Medication Administration: Crushed with puree   Supervision: Full supervision/cueing for compensatory strategies   Compensations: Slow rate;Small sips/bites;Multiple dry swallows after each bite/sip;Effortful swallow;Chin tuck;Clear throat intermittently   Postural Changes: Seated upright at 90 degrees;Remain semi-upright after after feeds/meals (Comment)   Oral Care Recommendations: Oral care BID      Deneise Lever, [place], CCC-SLP Speech-Language Pathologist   Aliene Altes 06/08/2017,2:41 PM

## 2017-06-09 DIAGNOSIS — J918 Pleural effusion in other conditions classified elsewhere: Secondary | ICD-10-CM

## 2017-06-09 DIAGNOSIS — Z9889 Other specified postprocedural states: Secondary | ICD-10-CM

## 2017-06-09 DIAGNOSIS — K219 Gastro-esophageal reflux disease without esophagitis: Secondary | ICD-10-CM

## 2017-06-09 DIAGNOSIS — R1312 Dysphagia, oropharyngeal phase: Secondary | ICD-10-CM

## 2017-06-09 DIAGNOSIS — Z888 Allergy status to other drugs, medicaments and biological substances status: Secondary | ICD-10-CM

## 2017-06-09 DIAGNOSIS — M459 Ankylosing spondylitis of unspecified sites in spine: Secondary | ICD-10-CM

## 2017-06-09 DIAGNOSIS — G40909 Epilepsy, unspecified, not intractable, without status epilepticus: Secondary | ICD-10-CM

## 2017-06-09 DIAGNOSIS — J690 Pneumonitis due to inhalation of food and vomit: Principal | ICD-10-CM

## 2017-06-09 DIAGNOSIS — Z79899 Other long term (current) drug therapy: Secondary | ICD-10-CM

## 2017-06-09 MED ORDER — AMOXICILLIN-POT CLAVULANATE 400-57 MG/5ML PO SUSR: 800.0000 mg | Freq: Two times a day (BID) | ORAL | 0 refills | Status: AC

## 2017-06-09 NOTE — Discharge Instructions (Signed)
Follow up with Speech therapy may instead be after repeat GI evaluation in the clinic. Please follow recommended aspiration precautions in the meantime.   Aspiration Precautions, Adult Aspiration is the breathing in (inhalation) of a liquid or object into the lungs. Things that can be inhaled into the lungs include:  Food.  Any type of liquid, such as drinks or saliva.  Stomach contents, such as vomit or stomach acid.  What are the signs of aspiration? Signs of aspiration include:  Coughing after swallowing food or liquids.  Clearing the throat often while eating.  Trouble breathing. This may include: ? Breathing quickly. ? Breathing very slowly. ? Loud breathing. ? Rumbling sounds from the lungs while breathing.  Coughing up phlegm (sputum) that: ? Is yellow, tan, or green. ? Has pieces of food in it. ? Is bad-smelling.  Having a hoarse, barky cough.  Not being able to speak.  A hoarse voice.  Drooling while eating.  A feeling of fullness in the throat or a feeling that something is stuck in the throat.  Choking often.  Coughing when lying down or having to sit up quickly after lying down.  A change in skin color. The skin may look red or blue.  Fever.  Pain in the chest or back.  A pained look on the face.  What are the complications of aspiration? Complications of aspiration include:  Losing weight because the person is not absorbing needed nutrients.  Loss of enjoyment and the social benefits of eating.  Choking.  Lung irritation, if someone aspirates acidic food or drinks.  Lung infection (pneumonia). In serious cases, death can occur.  What can I do to prevent aspiration? Caring for someone who can eat and drink safely by mouth If you are caring for someone who can eat and drink safely through his or her mouth:  Have the person sit in an upright position when eating food or drinking fluids. This can be done in two ways: ? Have the person  sit up in a chair. ? If sitting in a chair is not possible, position the person in bed so he or she is upright.  Remind the person to eat slowly and chew well. Make sure the person is awake and alert while eating.  Do not distract the person. This is especially important for people with thinking or memory (cognitive) problems.  Allow foods to cool. Hot foods may be more difficult to swallow.  Provide small meals more frequently, instead of 3 large meals. This may reduce fatigue during eating.  Check the person's mouth thoroughly for leftover food after eating.  Keep the person sitting upright for 30-45 minutes after eating.  Do not serve food or drink during 2 hours or more before bedtime.  General instructions Follow these general guidelines to prevent aspiration in someone who can eat and drink safely by mouth:  Never put food or liquids in the mouth of a person who is not fully alert.  Feed small amounts of food. Do not force feed.  For a person who is on a diet for swallowing difficulty (dysphagia diet), follow the recommended food and drink consistency. For example, in dysphagia diet level 1, thicken liquids to pudding-like consistency.  Provide oral care before and after meals.  Crush pills and put them in soft food such as pudding or ice cream. Some pills should not be crushed. Check with the health care provider before crushing any medicine.  Contact a health care provider if:  The person has a fever.  You notice warning signs, such as choking or coughing, when the person eats or drinks. Get help right away if:  The person has trouble breathing  The person is breathing very slowly or stops breathing.  The person coughs up thick, yellow, or tan sputum.  If someone is choking on food or an object, perform the Heimlich maneuver (abdominal thrusts).  The person has symptoms of pneumonia, such as: ? Coughing up mucus with a bad smell or blood in it. ? Feeling short  of breath. ? Complaining of chest pain. ? Sweating, fever, and chills. ? Feeling tired. ? Complaining of trouble breathing. ? Wheezing.  The person cannot stop choking.  The person is unable to breathe, turns blue, faints, or seems confused. These symptoms may represent a serious problem that is an emergency. Do not wait to see if the symptoms will go away. Get medical help right away. Call your local emergency services (911 in the [place]  Summary  Aspiration is the breathing in (inhalation) of a liquid or object into the lungs. Things that can be inhaled into the lungs include food, liquids, saliva, or stomach contents.  Aspiration can cause pneumonia or choking.  One sign of aspiration is coughing after swallowing food or liquids.  Contact a health care provider if you notice signs of aspiration. This information is not intended to replace advice given to you by your health care provider. Make sure you discuss any questions you have with your health care provider.

## 2017-06-09 NOTE — Progress Notes (Addendum)
   Subjective: MBBS study yesterday was concerning for oropharyngeal dysphagia. He is sitting comfortably in the bedside chair finishing breakfast. His biggest complaint is the foul bitter taste of crushed medications in applesauce. He continues to have a mild nonproductive cough.  Objective:  Vital signs in last 24 hours: Vitals:   06/07/17 2113 06/08/17 0623 06/08/17 2128 06/09/17 0555  BP: 104/72 103/65 92/60 (!) 97/58  Pulse: 84 86 87 85  Resp: 18 18 20 18   Temp: 97.7 F (36.5 C) 98.1 F (36.7 C) (!) 97.5 F (36.4 C) 97.8 F (36.6 C)  TempSrc:    Oral  SpO2:  97% 98% 97%  Weight:      Height:       Physical Exam  Cardiovascular: Normal rate and regular rhythm.   Pulmonary/Chest: Effort normal. Tachypnea noted. No respiratory distress.  Right sided basilar inspiratory crackles, left lung fields clear  Abdominal: Soft. He exhibits no distension. There is no tenderness.  Musculoskeletal:  Grossly restricted ROM in neck, shoulders, and back    Assessment/Plan: Bilateral Pleural Effusion Aspiration Pneumonia We will continue augmentin treatment through 7/25. He needs outpatient follow up in 4-6 weeks with chest xray to confirm resolution. He remains at moderate risk of repeat ongoing events. He does not need to follow any specific exercise restrictions besides what he feels comfortable doing without excessive dyspnea.  Oropharyngeal phase dysphagia Esophageal dysmotility He is doing alright with food today and will follow up with SLP probably about 1 week per GI recs from MBBS study. Study showed moderate aspiration risk with recommendation for dysphagia 3 diet and crushed/liquid medication routes. -To follow up in GI clinic with possible repeat dilation in the future. Follow up of stomach biopsies -Elevated head of bed > 30 degrees  Seizure disorder We are continuing his home regimen of solid pills based on reported neurologist instruction to avoid generic replacement with  the dose variation felt to cause a previous neurologic event. -Continue keppra regimen of 1500mg  at 8am, 375mg  at 12pm, 500mg  at 7pm, and 750mg  at 9pm -Continue trileptal 300mg  at 8am and 450mg  at 9pm -To follow up with outpatient neurologist for recommendations on ?dose equivalent liquid formulation or small pills  Dispo: Anticipated discharge to home today  Collier Salina, MD [facility] Internal Medicine Resident Pager# [phone] 06/09/2017, 3:04 PM

## 2017-06-09 NOTE — Discharge Summary (Signed)
Name: Robert Carey MRN: [id number] DOB: [birth year] 71 y.o. PCP: Asencion Noble, MD  Date of Admission: 06/06/2017  2:17 PM Date of Discharge: 06/09/2017 Attending Physician: Aldine Contes, MD  Discharge Diagnosis: Aspiration pneumonia [facility]) Dysphagia Esophageal dysmotility Seizure disorder GERD  Discharge Medications: Allergies as of 06/09/2017      Reactions   Dilantin [phenytoin Sodium Extended] Hives   Phenytoin Sodium Extended Rash      Medication List    STOP taking these medications   amoxicillin-clavulanate 875-125 MG tablet Commonly known as:  AUGMENTIN Replaced by:  amoxicillin-clavulanate 400-57 MG/5ML suspension     TAKE these medications   acetaminophen 500 MG tablet Commonly known as:  TYLENOL Take 500 mg by mouth every 6 (six) hours as needed.   amoxicillin-clavulanate 400-57 MG/5ML suspension Commonly known as:  AUGMENTIN Take 10 mLs (800 mg total) by mouth 2 (two) times daily. Replaces:  amoxicillin-clavulanate 875-125 MG tablet   aspirin EC 81 MG tablet Take 81 mg by mouth daily.   CALCARB 600/D PO Take 1 tablet by mouth daily.   celecoxib 200 MG capsule Commonly known as:  CELEBREX Take 200 mg by mouth daily.   clobetasol cream 0.05 % Commonly known as:  TEMOVATE Apply 1 application topically 2 (two) times daily as needed (ECZEMA).   hydroxychloroquine 200 MG tablet Commonly known as:  PLAQUENIL Take 200 mg by mouth 2 (two) times daily with a meal.   ketoconazole 2 % cream Commonly known as:  NIZORAL Apply 1 application topically 2 (two) times daily as needed for irritation.   levETIRAcetam 750 MG tablet Commonly known as:  KEPPRA Take 750-1,500 mg by mouth See admin instructions. Takes two 777m tablets in the morning for a total of 15076mdaily in the morning, 375 mg at noon, Takes two 25047mablets (500 mg) daily in the evening at 7pm, and 750 mg at 9p   levETIRAcetam 250 MG tablet Commonly known as:  KEPPRA Take 375-500  mg by mouth See admin instructions. Takes two 750m94mblets in the morning for a total of 1500mg44mly in the morning, 375 mg at noon, Takes two 250mg 22mets (500 mg) daily in the evening at 7pm, and 750 mg at 9p   multivitamin capsule Take 1 capsule by mouth daily.   Na Sulfate-K Sulfate-Mg Sulf 17.5-3.13-1.6 GM/180ML Soln Suprep as directed.  No substitutions.   NIFEdipine 60 MG 24 hr tablet Commonly known as:  PROCARDIA XL/ADALAT-CC Take 60 mg by mouth every evening.   NONFORMULARY OR COMPOUNDED ITEM Apply 1 application topically 2 (two) times daily as needed. Apply to neck twice daily as needed. Meloxicam, lidocaine, topiramate, prilocaine   NONFORMULARY OR COMPOUNDED ITEM Apply 1 application topically 4 (four) times daily as needed. Apply to right foot as needed. Ketoconazole, diclofenac, baclofen, lidocaine, gabapentin   omeprazole 40 MG capsule Commonly known as:  PRILOSEC TAKE (1) CAPSULE BY MOUTH ONCE DAILY. What changed:  See the new instructions.   OXcarbazepine 150 MG tablet Commonly known as:  TRILEPTAL TAKE 300MG IN THE MORNING AND 450MG IN THE EVENING   predniSONE 5 MG tablet Commonly known as:  DELTASONE Take 5 mg by mouth daily.   predniSONE 5 MG (48) Tbpk tablet Commonly known as:  STERAPRED UNI-PAK 48 TAB Take 1 tablet by mouth as directed.   PROBIOTIC PO Take 1 tablet by mouth daily.   silver sulfADIAZINE 1 % cream Commonly known as:  SILVADENE Apply 1 application topically daily as needed.  Disposition and follow-up:   Robert Carey was discharged from [facility] in Good condition.  At the hospital follow up visit please address:  1.  Aspiration pneumonia: Bilateral upper lobe and right middle lobe airspace disease was seen on CT. He was discharged to finish augmentin on 7/25. He needs repeat chest xray for resolution in 4-6 weeks.  2.  Oropharyngeal dysphagia and esophageal dysmotility: Plan follow up with GI  clinic outpatient, possibly to repeat esophageal dilation. Aspiration precautions with soft/dysphagia diet, medications as liquid or crushed, and elevating head of the bed at home.  3.  Seizure disorder: Oral Keppra was continued unchanged. Recommended to discuss with neurologist appropriate brand equivalent dosing as a solution or maybe smaller EC tablets.   Follow-up Appointments:   Hospital Course by problem list: Principal Problem:   Aspiration pneumonia ([place]) Active Problems:   ESOPHAGEAL MOTILITY DISORDER   GERD   Ankylosing spondylitis ([place])   Seizure disorder (HCC)   Dysphagia   Aspiration pneumonia with associated bilateral pleural effusions Robert Carey had dyspnea with respiratory alkalosis and compensatory metabolic acidosis on arrival although minimal hypoxia. Not hypoxic throughout the admission. CT chest on 7/19 showed bilateral upper and middle right lobe airspace disease with mosaicism and small pleural effusions. Augmentin was continued and IR performed US guided aspiration of the L pleural effusion that indicated a transudative effusion. He underwent EGD and swallow study indicating moderate aspiration risk, dysmotility, and reduced airway protection all suggesting aspiration as an etiology of airspace disease. Continued augmentin PO suspension at discharge.  Esophageal dysmotility Food particles present in esophagus on CT and EGD without obvious stricture. Upper esophagus dilation was empirically performed. MBBS demonstrated oropharyngeal phase dysphagia as well. He was instructed on precautions including head of the bed elevation, interval between eating and sleep, chin tilt maneuvers, and crushed/suspension medications where possible. No specific major aspiration events occurred during the admission.  GERD Continued PPI therapy, now with reflux precautions incidentally to his aspiration risk.  Seizure disorder Keppra was continued per home regimen, not changed to  suspension here with dysphagia due to previous neurologist recommendations to avoid generic substitution due to difficulty controlling symptoms before.   Discharge Vitals:   BP (!) 97/58 (BP Location: Left Arm)   Pulse 85   Temp 97.8 F (36.6 C) (Oral)   Resp 18   Ht 5' 11"  (1.803 m)   Wt 183 lb (83 kg)   SpO2 97%   BMI 25.52 kg/m   Pertinent Labs, Studies, and Procedures:  Upper Endoscopy 7/20 - Esophagogastric landmarks identified. - Residual secretions lining the entire esosphagus, consistent with severe dysmotility. No obvious stricture noted in the distal esopohagus - Empiric dilation performed to 23m using Savory dilator, large dilation met with resistance - suspect the patient could have UES stenosis contributing to aspiration as well - Two gastric polyps. Biopsied. - Normal duodenal bulb and second portion of the duodenum. Overall, the patient has evidence of severe dysmotility on this exam which is likely causing his symptoms. I suspect there is a component of cricopharyngeal bar or UES stenosis contributing to dysphagia as well.  ULTRASOUND GUIDED LEFT DIAGNOSTIC THORACENTESIS 7/20 Successful ultrasound guided diagnostic line thoracentesis yielding 110 mL of pleural fluid.  Modified barium swallowing study 7/21  Deviations in swallow physiology include reduced amplitude/duration of UES opening and appearance of thickening of the cervical esophagus, which restricted bolus flow resulting in consistent mod-severe residual in the valleculae, pyriform sinuses and CP segment, solids>liquids. Oral  stage of swallowing unremarkable. Swallow initiation appears WFL at the level of the valleculae. Intermittent delay to level of pyriform sinuses with thin liquids, resulting in flash penetration x1. No overt aspiration observed, however pt did cough x1 between frames; suspect reduced airway protection given amount of pharyngeal residue.Trialed chin tuck which appeared to facilitate  slightly improved esophageal opening, reducing amount of residual. Cued subsequent dry swallow in chin tuck position for functional clearance of remaining pharyngeal residue. Attempted [facility], however pt was unable to perform accurately despite cues. Barium tablet retained in the valleculae, ultimately clearing after several attempts at effortful swallow + chin tuck, liquid wash. An esophageal sweep performed in the upright postion with barium tablet and thin liquid wash revealed a standing column of barium in the middle esophagus. Upon concluding the exam, SLP provided education to pt and his wife re: compensatory techniques should this improve symptoms. Approx 5 min after the exam ended, pt with delayed coughing episode; ? reflux given history. Recommend dys 3 with thin liquids, medications crushed in puree, strict reflux precautions. Pt should use chin tuck with solids and liquids, swallow 2 times per bolus. Upright during and for 30 minutes after meals. Alternate liquids and solids; may benefit from room temperature fluids vs. cold liquids with meals.  Discharge Instructions: Discharge Instructions    Call MD for:  difficulty breathing, headache or visual disturbances    Complete by:  As directed    Call MD for:  temperature >100.4    Complete by:  As directed    Diet - low sodium heart healthy    Complete by:  As directed    Discharge instructions    Complete by:  As directed    You were seen at the hospital and treated for aspiration pneumonia. You should continue taking augmentin (amoxicillin-clavulanate) 855m twice daily until July 25 to complete a treatment for this infection. Your lungs will probably remain somewhat irritated for up to a few weeks from now so we recommend a visit with your primary medicine doctor in about 4-6 weeks for a repeat chest xray and discuss symptoms.  You should call to discuss with your neurologist about any changes to the Keppra form or dosing that  would be easier for swallowing.  The GI doctors will see you again in clinic to reassess and may need to repeat the EGD with dilation for your upper esophagus. They have also ordered for outpatient speech therapy to contact with you in about a week.   Increase activity slowly    Complete by:  As directed       Signed: CCollier Salina MD PGY-III Internal Medicine Resident Pager# [phone]/24/2018, 2:12 PM

## 2017-06-09 NOTE — Progress Notes (Signed)
Internal Medicine Attending:   I saw and examined the patient. I reviewed the resident's note and I agree with the resident's findings and plan as documented in the resident's note.  

## 2017-06-09 NOTE — Progress Notes (Signed)
  Speech Language Pathology Treatment and Discharge Summary: Dysphagia  Patient Details Name: Robert Carey MRN: [id number] DOB: 01-12-1946 Today's Date: 06/09/2017 Time: 2248-2500 SLP Time Calculation (min) (ACUTE ONLY): 26 min  Assessment / Plan / Recommendation Clinical Impression  Pt seen today for dysphagia treatment prior to d/c. Wife present; pt to follow up with GI upon d/c for further management. Provided education re: results of yesterday's MBS showing mild-moderate pharyngeal dysphagia secondary to primary esophageal dysphagia. Pt and wife provided with written and verbal instruction in diet recommendations as well as compensatory chin tuck with multiple swallows which was seen to improve bolus clearance through the upper esophagus on MBS. Provided additional recommendations re: management of esophageal dysphagia, recommend f/u in OP with SLP for repeat MBS after further GI management. Education complete, SLP will s/o.    HPI HPI: 71 yo male with hx of scleroderma, ankylosing spondylitis, hiatal hernia, dementia, GERD, gastroparesis, presented 06/06/17 with recurrent dysphagia and fluid / debris filled esophagus on chest CT scan. 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 Pt with PNA; per MD likely aspiration related to poor esophageal clearance. 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 EGD 06/07/17, s/p empiric dilation to 43m; large dilation met with resistance d/t ?UES stenosis. Residual secretions lining entire esophagus, consistent with severe dysmotility, 2 gastric polyps.       SLP Plan  Discharge SLP treatment due to (comment) (goals met)       Recommendations  Diet recommendations: Regular;Thin liquid Liquids provided via: Cup Medication Administration: Crushed with puree (may also break into smaller pieces vs crush) Supervision: Patient able to self feed;Intermittent supervision to  cue for compensatory strategies Compensations: Slow rate;Small sips/bites;Multiple dry swallows after each bite/sip;Effortful swallow;Chin tuck;Clear throat intermittently Postural Changes and/or Swallow Maneuvers: Chin tuck                General recommendations: Other(comment) (f/u with GI) Oral Care Recommendations: Oral care BID Follow up Recommendations: Outpatient SLP;Other (comment) (repeat MBS after f/u with GI) SLP Visit Diagnosis: Dysphagia, pharyngoesophageal phase (R13.14) Plan: Discharge SLP treatment due to (comment) (goals met)       GO           Functional Assessment Tool Used: skilled clinical judgment Functional Limitations: Swallowing Swallow Current Status (([id number]: At least 20 percent but less than 40 percent impaired, limited or restricted Swallow Goal Status ([phone]: At least 20 percent but less than 40 percent impaired, limited or restricted Swallow Discharge Status [phone]: At least 20 percent but less than 40 percent impaired, limited or restricted   MDeneise Lever MWarrior CElizabethtownPathologist [phone] MAliene Altes7/22/2018, 4:30 PM

## 2017-06-10 ENCOUNTER — Encounter (HOSPITAL_COMMUNITY): Payer: Self-pay | Admitting: Gastroenterology

## 2017-06-10 ENCOUNTER — Telehealth: Payer: Self-pay | Admitting: Internal Medicine

## 2017-06-10 NOTE — Telephone Encounter (Signed)
Will accept only if okay with Dr. Henrene Pastor. I met him during his recent hospitalization. I think he should have a repeat EGD with dilation in about a month or so. thanks

## 2017-06-10 NOTE — Telephone Encounter (Signed)
No problem.

## 2017-06-10 NOTE — Anesthesia Postprocedure Evaluation (Signed)
Anesthesia Post Note  Patient: Robert Carey  Procedure(s) Performed: Procedure(s) (LRB): ESOPHAGOGASTRODUODENOSCOPY (EGD) WITH PROPOFOL (N/A) SAVORY DILATION (N/A)     Patient location during evaluation: PACU Anesthesia Type: MAC Level of consciousness: awake and alert Pain management: pain level controlled Vital Signs Assessment: post-procedure vital signs reviewed and stable Respiratory status: spontaneous breathing, nonlabored ventilation, respiratory function stable and patient connected to nasal cannula oxygen Cardiovascular status: stable and blood pressure returned to baseline Anesthetic complications: no    Last Vitals:  Vitals:   06/08/17 2128 06/09/17 0555  BP: 92/60 (!) 97/58  Pulse: 87 85  Resp: 20 18  Temp: (!) 36.4 C 36.6 C    Last Pain:  Vitals:   06/09/17 1003  TempSrc:   PainSc: 0-No pain   Pain Goal:                 Alianny Toelle S

## 2017-06-10 NOTE — Telephone Encounter (Signed)
Do you want this scheduled at the hospital or [facility]? Thanks, Almyra Free

## 2017-06-10 NOTE — Telephone Encounter (Signed)
Dr. Havery Moros will you accept?

## 2017-06-10 NOTE — Telephone Encounter (Signed)
Routed to myself to schedule.

## 2017-06-10 NOTE — Telephone Encounter (Signed)
It can be done at the [facility]. Thanks

## 2017-06-12 ENCOUNTER — Encounter: Payer: Self-pay | Admitting: Gastroenterology

## 2017-06-12 LAB — CULTURE, BODY FLUID W GRAM STAIN -BOTTLE: Culture: NO GROWTH

## 2017-06-12 LAB — CULTURE, BODY FLUID-BOTTLE

## 2017-06-12 NOTE — Telephone Encounter (Signed)
Dr. Havery Moros has accepted this patient, he is scheduled for pre-visit and follow up EGD. I do not know if something in our computer system needs to be changed as far as providers, can you take care of this please. Thanks.

## 2017-06-13 ENCOUNTER — Encounter: Payer: Self-pay | Admitting: Gastroenterology

## 2017-06-13 DIAGNOSIS — R609 Edema, unspecified: Secondary | ICD-10-CM | POA: Diagnosis not present

## 2017-06-13 DIAGNOSIS — J690 Pneumonitis due to inhalation of food and vomit: Secondary | ICD-10-CM | POA: Diagnosis not present

## 2017-06-14 DIAGNOSIS — Q828 Other specified congenital malformations of skin: Secondary | ICD-10-CM | POA: Diagnosis not present

## 2017-06-14 DIAGNOSIS — I739 Peripheral vascular disease, unspecified: Secondary | ICD-10-CM | POA: Diagnosis not present

## 2017-06-14 DIAGNOSIS — S91312A Laceration without foreign body, left foot, initial encounter: Secondary | ICD-10-CM | POA: Diagnosis not present

## 2017-06-19 ENCOUNTER — Encounter: Payer: Self-pay | Admitting: Gastroenterology

## 2017-06-21 ENCOUNTER — Other Ambulatory Visit (HOSPITAL_COMMUNITY): Payer: Self-pay | Admitting: Internal Medicine

## 2017-06-21 ENCOUNTER — Ambulatory Visit (HOSPITAL_COMMUNITY)
Admission: RE | Admit: 2017-06-21 | Discharge: 2017-06-21 | Disposition: A | Discharge status: Home / Self Care | Payer: PPO | Source: Ambulatory Visit | Attending: Internal Medicine | Admitting: Internal Medicine

## 2017-06-21 DIAGNOSIS — R0602 Shortness of breath: Secondary | ICD-10-CM | POA: Insufficient documentation

## 2017-06-21 DIAGNOSIS — I513 Intracardiac thrombosis, not elsewhere classified: Secondary | ICD-10-CM | POA: Diagnosis not present

## 2017-06-21 DIAGNOSIS — M459 Ankylosing spondylitis of unspecified sites in spine: Secondary | ICD-10-CM | POA: Diagnosis not present

## 2017-06-21 DIAGNOSIS — I739 Peripheral vascular disease, unspecified: Secondary | ICD-10-CM | POA: Insufficient documentation

## 2017-06-21 DIAGNOSIS — Z01818 Encounter for other preprocedural examination: Secondary | ICD-10-CM

## 2017-06-21 DIAGNOSIS — I5021 Acute systolic (congestive) heart failure: Secondary | ICD-10-CM | POA: Diagnosis not present

## 2017-06-21 DIAGNOSIS — I081 Rheumatic disorders of both mitral and tricuspid valves: Secondary | ICD-10-CM | POA: Diagnosis not present

## 2017-06-21 MED ORDER — PERFLUTREN LIPID MICROSPHERE
1.0000 mL | INTRAVENOUS | Status: AC | PRN
  Filled 2017-06-21: qty 10

## 2017-06-21 MED ORDER — PERFLUTREN LIPID MICROSPHERE
1.0000 mL | INTRAVENOUS | Status: AC | PRN
  Administered 2017-06-21: 2 mL via INTRAVENOUS
  Filled 2017-06-21: qty 10

## 2017-06-21 NOTE — Progress Notes (Signed)
*  PRELIMINARY RESULTS* Echocardiogram 2D Echocardiogram with definity has been performed.  Robert Carey 06/21/2017, 12:09 PM

## 2017-06-24 ENCOUNTER — Other Ambulatory Visit (HOSPITAL_COMMUNITY): Payer: Self-pay | Admitting: Internal Medicine

## 2017-06-24 DIAGNOSIS — Z01818 Encounter for other preprocedural examination: Secondary | ICD-10-CM

## 2017-06-24 NOTE — Progress Notes (Signed)
Cardiology Office Note   Date:  06/25/2017   ID:  Robert Carey, DOB 1946/01/01, MRN [id number]  PCP:  Asencion Noble, MD  Cardiologist:   Wyn Forster chief complaint on file.     History of Present Illness: Robert Carey is a 71 y.o. male who presents for consultation regarding CAD risk Referred by Dr Willey Blade Patient has distal PVD. Seen by Dr Gwenlyn Found 03/2017 noted records from Dr Gwenlyn Found and Dr Corrie Mckusick IR. Had non healing ulcer right great toe ABI"s over one with occluded right PT artery which was "recanalized" He is a retired Forensic psychologist. Family history with father MI early age. Non smoker No DM or hyperlipidemia. Has had issues with seizures which affect memory. Ankylosing Spondylitis  Echo done by Dr Willey Blade 06/21/17 for dyspnea Showed EF 20-25% Restrictive physiology ? Apical thrombus Akinesis of apex and lateral wall he has no chest pain. No TIA or embolic events started on eliquis  His has had recent failure to thrive. 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 He has had weight loss and esophageal dysmotility with aspiration and recent stretching of his esophagus He is supposed to have f/u endoscopy at end of month with Dr Gearlean Alf discussion with mostly wife as he does not follow as well. She had lots of intelligent questions and has accessed all his records in my chart. She understands diagnosis of CHF and issues with decreased EF and need for cath to risk stratify  Unable to schedule with Dr Gwenlyn Found for more than two weeks   Past Medical History:  Diagnosis Date  . Arthritis   . AS (ankylosing spondylitis) (HCC)   . Aspiration pneumonia ([place]) 05/2017  . Cataract   . CREST syndrome ([place])   . Dementia   . Diverticulosis of colon (without mention of hemorrhage)   . Esophageal reflux   . Gastroparesis   . Hiatal hernia   . Mixed connective tissue disease ([place])   . Motility disorder, esophageal     . Raynauds phenomenon   . Scleroderma ([place])   . Seizure disorder ([place])   . Seizures ([place])    07/2016  . Skin ulcers of foot, bilateral ([place]) 01/03/2017   R foot only  . Spondylitis, ankylosing ([place])   . Stricture and stenosis of esophagus     Past Surgical History:  Procedure Laterality Date  . ESOPHAGOGASTRODUODENOSCOPY (EGD) WITH PROPOFOL N/A 06/07/2017   Procedure: ESOPHAGOGASTRODUODENOSCOPY (EGD) WITH PROPOFOL;  Surgeon: Manus Gunning, MD;  Location: [facility];  Service: Gastroenterology;  Laterality: N/A;  . IR GENERIC HISTORICAL  02/07/2017   IR TIB-PERO ART UNI PTA EA ADD VESSEL MOD SED 02/07/2017 Corrie Mckusick, DO [facility]-INTER[facility]  . IR GENERIC HISTORICAL  02/07/2017   IR US GUIDE VASC ACCESS LEFT 02/07/2017 Corrie Mckusick, DO [facility]  . IR GENERIC HISTORICAL  02/07/2017   IR TIB-PERO ART PTA MOD SED 02/07/2017 Corrie Mckusick, DO [facility]  . IR GENERIC HISTORICAL  02/07/2017   IR ANGIOGRAM SELECTIVE EACH ADDITIONAL VESSEL 02/07/2017 Corrie Mckusick, DO [facility]  . IR GENERIC HISTORICAL  02/07/2017   IR ANGIOGRAM EXTREMITY RIGHT 02/07/2017 Corrie Mckusick, DO [facility]  . IR GENERIC HISTORICAL  02/07/2017   IR THROMBECT PRIM MECH INIT (INCLU) MOD SED 02/07/2017 Corrie Mckusick, DO [facility]  . IR RADIOLOGIST EVAL & MGMT  02/27/2017  . IR RADIOLOGIST  EVAL & MGMT  01/22/2017  . IR THORACENTESIS ASP PLEURAL SPACE W/IMG GUIDE  06/07/2017  . NOSE SURGERY  2005  . SAVORY DILATION N/A 06/07/2017   Procedure: SAVORY DILATION;  Surgeon: Manus Gunning, MD;  Location: [facility] ENDOSCOPY;  Service: Gastroenterology;  Laterality: N/A;  . THUMB ARTHROSCOPY Left   . TOE SURGERY     right great toe  . WISDOM TOOTH EXTRACTION       Current Outpatient Prescriptions  Medication Sig Dispense Refill  . acetaminophen (TYLENOL) 500 MG tablet Take 500 mg by mouth every 6 (six) hours as needed.    Marland Kitchen apixaban (ELIQUIS) 5 MG TABS tablet Take 5 mg by mouth 2 (two) times daily.    Marland Kitchen  aspirin EC 81 MG tablet Take 81 mg by mouth daily.    . Calcium Carbonate-Vitamin D (CALCARB 600/D PO) Take 1 tablet by mouth daily.     . clobetasol cream (TEMOVATE) 0.45 % Apply 1 application topically 2 (two) times daily as needed (ECZEMA).     . furosemide (LASIX) 20 MG tablet Take 1 tablet by mouth daily.    . hydroxychloroquine (PLAQUENIL) 200 MG tablet Take 200 mg by mouth 2 (two) times daily with a meal.     . ketoconazole (NIZORAL) 2 % cream Apply 1 application topically 2 (two) times daily as needed for irritation.     . levETIRAcetam (KEPPRA) 250 MG tablet Take 375-500 mg by mouth See admin instructions. Takes two 750mg  tablets in the morning for a total of 1500mg  daily in the morning, 375 mg at noon, Takes two 250mg  tablets (500 mg) daily in the evening at 7pm, and 750 mg at 9p    . levETIRAcetam (KEPPRA) 750 MG tablet Take 750-1,500 mg by mouth See admin instructions. Takes two 750mg  tablets in the morning for a total of 1500mg  daily in the morning, 375 mg at noon, Takes two 250mg  tablets (500 mg) daily in the evening at 7pm, and 750 mg at 9p    . Multiple Vitamin (MULTIVITAMIN) capsule Take 1 capsule by mouth daily.    . Na Sulfate-K Sulfate-Mg Sulf 17.5-3.13-1.6 GM/180ML SOLN Suprep as directed.  No substitutions. 354 mL 0  . NIFEdipine (PROCARDIA XL/ADALAT-CC) 60 MG 24 hr tablet Take 60 mg by mouth every evening.     . NONFORMULARY OR COMPOUNDED ITEM Apply 1 application topically 2 (two) times daily as needed. Apply to neck twice daily as needed. Meloxicam, lidocaine, topiramate, prilocaine    . NONFORMULARY OR COMPOUNDED ITEM Apply 1 application topically 4 (four) times daily as needed. Apply to right foot as needed. Ketoconazole, diclofenac, baclofen, lidocaine, gabapentin    . omeprazole (PRILOSEC) 40 MG capsule TAKE (1) CAPSULE BY MOUTH ONCE DAILY. (Patient taking differently: TAKE 40MG  BY MOUTH ONCE DAILY.) 30 capsule 3  . OXcarbazepine (TRILEPTAL) 150 MG tablet TAKE 300MG  IN THE  MORNING AND 450MG  IN THE EVENING    . predniSONE (DELTASONE) 5 MG tablet Take 5 mg by mouth daily.    . predniSONE (STERAPRED UNI-PAK 48 TAB) 5 MG (48) TBPK tablet Take 1 tablet by mouth as directed.    . Probiotic Product (PROBIOTIC PO) Take 1 tablet by mouth daily.    . silver sulfADIAZINE (SILVADENE) 1 % cream Apply 1 application topically daily as needed.      No current facility-administered medications for this visit.     Allergies:   Dilantin [phenytoin sodium extended] and Phenytoin sodium extended    Social History:  The  patient  reports that he has never smoked. He has never used smokeless tobacco. He reports that he does not drink alcohol or use drugs.   Family History:  The patient's family history includes Breast cancer in his mother; COPD in his father; Diabetes in his mother; Heart disease in his father and mother; Melanoma in his sister; Pancreatic cancer in his mother; Stroke in his brother.    ROS:  Please see the history of present illness.   Otherwise, review of systems are positive for none.   All other systems are reviewed and negative.    PHYSICAL EXAM: VS:  BP 108/74   Pulse 68   Ht 5\' 11"  (1.803 m)   Wt 153 lb (69.4 kg)   BMI 21.34 kg/m  , BMI Body mass index is 21.34 kg/m. Memory poor  Chronically ill male  HEENT: reduced cervical neck curvature and mobility  Neck supple with no adenopathy JVP normal no bruits no thyromegaly Lungs clear with no wheezing and good diaphragmatic motion Heart:  S1/S2 MR murmur, no rub, gallop or click PMI normal Abdomen: benighn, BS positve, no tenderness, no AAA no bruit.  No HSM or HJR Distal pulses intact with no bruits Trace bilateral ankle edema Neuro non-focal Skin warm and dry No muscular weakness Arthritic changes in hands    EKG:  03/26/17  SR T wave inversions I,AVL otherwise normal    Recent Labs: 06/06/2017: ALT 25 06/08/2017: BUN 15; Creatinine, Ser 1.02; Hemoglobin 12.7; Platelets 223; Potassium 3.4;  Sodium 139    Lipid Panel No results found for: CHOL, TRIG, HDL, CHOLHDL, VLDL, LDLCALC, LDLDIRECT    Wt Readings from Last 3 Encounters:  06/25/17 153 lb (69.4 kg)  06/06/17 183 lb (83 kg)  06/04/17 162 lb (73.5 kg)      Other studies Reviewed: Additional studies/ records that were reviewed today include: Notes Dr Gwenlyn Found Angiogram Dr Earleen Newport Notes [facility] from neurology.    ASSESSMENT AND PLAN:  1.  PVD: on plavix f/u Gwenlyn Found appears to have more calcified distal tibial vessel disease  2. Seizures:  On keppra f/u [facility] 3. Ankylosing Spondylitis:  Advised not to use celebrex for pain PT/OT 4. CHF:  Not clear how much of clinical dyspnea and fatigue is from aspiration and Ankylosing Spondylitis with failure to thrive Needs right heart with cors no LV gram to r/o CAD and assess risk going forward. Have arranged for Dr End on Friday Can start coreg and ACE post cath pending results of right heart and angiogram. Will hold eliquis on Wendsday   Current medicines are reviewed at length with the patient today.  The patient does not have concerns regarding medicines.  The following changes have been made:  Hold eliquis 48 hours before cath  Labs/ tests ordered today include: Pre cath   Orders Placed This Encounter  Procedures  . Basic metabolic panel  . CBC  . Protime-INR  . APTT     Disposition:   FU with Dr Gwenlyn Found post cath     Signed, Jenkins Rouge, MD  06/25/2017 3:24 PM    [facility] [address], [place], [place]  [address] Phone: [phone]; Fax: [phone]

## 2017-06-25 ENCOUNTER — Encounter: Payer: Self-pay | Admitting: Cardiovascular Disease

## 2017-06-25 ENCOUNTER — Other Ambulatory Visit (HOSPITAL_COMMUNITY)
Admission: RE | Admit: 2017-06-25 | Discharge: 2017-06-25 | Disposition: A | Discharge status: Home / Self Care | Payer: PPO | Source: Ambulatory Visit | Attending: Cardiovascular Disease | Admitting: Cardiovascular Disease

## 2017-06-25 ENCOUNTER — Other Ambulatory Visit (HOSPITAL_COMMUNITY): Payer: Self-pay | Admitting: Cardiovascular Disease

## 2017-06-25 ENCOUNTER — Ambulatory Visit (INDEPENDENT_AMBULATORY_CARE_PROVIDER_SITE_OTHER): Payer: PPO | Admitting: Cardiovascular Disease

## 2017-06-25 VITALS — BP 108/74 | HR 68 | Ht 71.0 in | Wt 153.0 lb

## 2017-06-25 DIAGNOSIS — I509 Heart failure, unspecified: Secondary | ICD-10-CM | POA: Diagnosis not present

## 2017-06-25 DIAGNOSIS — I255 Ischemic cardiomyopathy: Secondary | ICD-10-CM | POA: Diagnosis not present

## 2017-06-25 DIAGNOSIS — S91312D Laceration without foreign body, left foot, subsequent encounter: Secondary | ICD-10-CM | POA: Diagnosis not present

## 2017-06-25 DIAGNOSIS — I5021 Acute systolic (congestive) heart failure: Secondary | ICD-10-CM

## 2017-06-25 LAB — CBC
HCT: 37.9 % — ABNORMAL LOW (ref 39.0–52.0)
Hemoglobin: 12.3 g/dL — ABNORMAL LOW (ref 13.0–17.0)
MCH: 27 pg (ref 26.0–34.0)
MCHC: 32.5 g/dL (ref 30.0–36.0)
MCV: 83.1 fL (ref 78.0–100.0)
PLATELETS: 180 10*3/uL (ref 150–400)
RBC: 4.56 MIL/uL (ref 4.22–5.81)
RDW: 15.5 % (ref 11.5–15.5)
WBC: 9.2 10*3/uL (ref 4.0–10.5)

## 2017-06-25 LAB — BASIC METABOLIC PANEL
Anion gap: 10 (ref 5–15)
BUN: 25 mg/dL — AB (ref 6–20)
CALCIUM: 9.1 mg/dL (ref 8.9–10.3)
CO2: 24 mmol/L (ref 22–32)
CREATININE: 1.2 mg/dL (ref 0.61–1.24)
Chloride: 106 mmol/L (ref 101–111)
GFR calc non Af Amer: 60 mL/min — ABNORMAL LOW (ref >60)
Glucose, Bld: 110 mg/dL — ABNORMAL HIGH (ref 65–99)
Potassium: 4 mmol/L (ref 3.5–5.1)
SODIUM: 140 mmol/L (ref 135–145)

## 2017-06-25 LAB — PROTIME-INR
INR: 1.42
PROTHROMBIN TIME: 17.5 s — AB (ref 11.4–15.2)

## 2017-06-25 LAB — APTT: APTT: 42 s — AB (ref 24–36)

## 2017-06-25 NOTE — Patient Instructions (Addendum)
Medication Instructions:   Your physician recommends that you continue on your current medications as directed. Please refer to the Current Medication list given to you today.  Labwork:  Your physician recommends that you pre cath lab work today.  Testing/Procedures:  Right heart cath. See instructions below.  Follow-Up:  Your physician recommends that you schedule a follow-up appointment in: after heart cath.  Any Other Special Instructions Will Be Listed Below (If Applicable).   Robert [place] [id number] Dept: [phone] Loc: [facility]  06/25/2017  You are scheduled for a Right Heart Cath on Friday, June 28, 2017 at 1:30 pm with Dr. Saunders Revel.  1. Please arrive at the [facility] (Main Entrance A) at [facility]: [place], [place] [address] at 11:30 am (two hours before your procedure to ensure your preparation). Free valet parking service is available.   Special note: Every effort is made to have your procedure done on time. Please understand that emergencies sometimes delay scheduled procedures.  2. Diet: Nothing to eat or drink after midnight the night before your test.   3. Labs: BMET, CBC, PT/INR, PTT.  4. Medication instructions in preparation for your procedure: HOLD ELIQUIS 2 DAYS BEFORE YOUR CATH STARTING ON 06/26/17. Hold your morning dose of furosemide the day of your test. You may take the rest of your medications that morning with a sip of water.   5. Plan for one night stay--bring personal belongings. 6. Bring a current list of your medications and current insurance cards. 7. You MUST have a responsible person to drive you home. 8. Someone MUST be with you the first 24 hours after you arrive home or your discharge will be delayed. 9. Please wear clothes that are easy to get on and off and wear slip-on shoes.  Thank you for  allowing [place] to care for you!    -- [facility]  If you need a refill on your cardiac medications before your next appointment, please call your pharmacy.

## 2017-06-27 ENCOUNTER — Telehealth: Payer: Self-pay

## 2017-06-27 NOTE — Telephone Encounter (Signed)
Patient contacted pre-catheterization at [facility] scheduled for:  06/28/2017 @ 1330 Verified arrival time and place:  NT @ 1130 Confirmed AM meds to be taken pre-cath with sip of water: Take ASA Hold Lasix Hold Eliquis-last dose was Tuesday 06/25/2017 May take all other AM meds.  Pt to take seizure medication at 1130 prior to arrival to Short Stay with sip of water  Confirmed patient has responsible person to drive home post procedure and observe patient for 24 hours:  wife Addl concerns:  Pt recovering from PNA-has frequent coughing.  Notified wife to alert Dr. Saunders Revel prior to procedure.  Wife also concerned that Dr. Saunders Revel know Pt has frequent seizures.  Wife to alert Dr. Saunders Revel prior to procedure.  Gave wife this nurse name and # for any further questions.

## 2017-06-28 ENCOUNTER — Encounter (HOSPITAL_COMMUNITY)
Admission: RE | Disposition: A | Discharge status: Home / Self Care | Payer: Self-pay | Source: Ambulatory Visit | Attending: Internal Medicine

## 2017-06-28 ENCOUNTER — Ambulatory Visit (HOSPITAL_COMMUNITY)
Admission: RE | Admit: 2017-06-28 | Discharge: 2017-06-28 | Disposition: A | Discharge status: Home / Self Care | Payer: PPO | Source: Ambulatory Visit | Attending: Internal Medicine | Admitting: Internal Medicine

## 2017-06-28 DIAGNOSIS — Z7952 Long term (current) use of systemic steroids: Secondary | ICD-10-CM | POA: Insufficient documentation

## 2017-06-28 DIAGNOSIS — I739 Peripheral vascular disease, unspecified: Secondary | ICD-10-CM | POA: Insufficient documentation

## 2017-06-28 DIAGNOSIS — I2510 Atherosclerotic heart disease of native coronary artery without angina pectoris: Secondary | ICD-10-CM | POA: Diagnosis not present

## 2017-06-28 DIAGNOSIS — M459 Ankylosing spondylitis of unspecified sites in spine: Secondary | ICD-10-CM | POA: Diagnosis not present

## 2017-06-28 DIAGNOSIS — Z7902 Long term (current) use of antithrombotics/antiplatelets: Secondary | ICD-10-CM | POA: Insufficient documentation

## 2017-06-28 DIAGNOSIS — I272 Pulmonary hypertension, unspecified: Secondary | ICD-10-CM | POA: Insufficient documentation

## 2017-06-28 DIAGNOSIS — G40909 Epilepsy, unspecified, not intractable, without status epilepticus: Secondary | ICD-10-CM | POA: Diagnosis not present

## 2017-06-28 DIAGNOSIS — M351 Other overlap syndromes: Secondary | ICD-10-CM | POA: Diagnosis not present

## 2017-06-28 DIAGNOSIS — Z7901 Long term (current) use of anticoagulants: Secondary | ICD-10-CM | POA: Insufficient documentation

## 2017-06-28 DIAGNOSIS — I7300 Raynaud's syndrome without gangrene: Secondary | ICD-10-CM | POA: Insufficient documentation

## 2017-06-28 DIAGNOSIS — K219 Gastro-esophageal reflux disease without esophagitis: Secondary | ICD-10-CM | POA: Diagnosis not present

## 2017-06-28 DIAGNOSIS — I2582 Chronic total occlusion of coronary artery: Secondary | ICD-10-CM | POA: Diagnosis not present

## 2017-06-28 DIAGNOSIS — R627 Adult failure to thrive: Secondary | ICD-10-CM | POA: Insufficient documentation

## 2017-06-28 DIAGNOSIS — I255 Ischemic cardiomyopathy: Secondary | ICD-10-CM | POA: Insufficient documentation

## 2017-06-28 DIAGNOSIS — M1990 Unspecified osteoarthritis, unspecified site: Secondary | ICD-10-CM | POA: Diagnosis not present

## 2017-06-28 DIAGNOSIS — Z7982 Long term (current) use of aspirin: Secondary | ICD-10-CM | POA: Diagnosis not present

## 2017-06-28 DIAGNOSIS — M341 CR(E)ST syndrome: Secondary | ICD-10-CM | POA: Diagnosis not present

## 2017-06-28 DIAGNOSIS — F0390 Unspecified dementia without behavioral disturbance: Secondary | ICD-10-CM | POA: Diagnosis not present

## 2017-06-28 DIAGNOSIS — K3184 Gastroparesis: Secondary | ICD-10-CM | POA: Diagnosis not present

## 2017-06-28 DIAGNOSIS — I5022 Chronic systolic (congestive) heart failure: Secondary | ICD-10-CM | POA: Insufficient documentation

## 2017-06-28 DIAGNOSIS — I5021 Acute systolic (congestive) heart failure: Secondary | ICD-10-CM

## 2017-06-28 DIAGNOSIS — I429 Cardiomyopathy, unspecified: Secondary | ICD-10-CM | POA: Diagnosis not present

## 2017-06-28 HISTORY — PX: CORONARY ANGIOGRAPHY: CATH118303

## 2017-06-28 HISTORY — PX: RIGHT HEART CATH: CATH118263

## 2017-06-28 LAB — POCT I-STAT 3, VENOUS BLOOD GAS (G3P V)
ACID-BASE DEFICIT: 2 mmol/L (ref 0.0–2.0)
Bicarbonate: 21.9 mmol/L (ref 20.0–28.0)
O2 Saturation: 65 %
PH VEN: 7.408 (ref 7.250–7.430)
TCO2: 23 mmol/L (ref 0–100)
pCO2, Ven: 34.7 mmHg — ABNORMAL LOW (ref 44.0–60.0)
pO2, Ven: 33 mmHg (ref 32.0–45.0)

## 2017-06-28 LAB — POCT I-STAT 3, ART BLOOD GAS (G3+)
ACID-BASE DEFICIT: 2 mmol/L (ref 0.0–2.0)
Bicarbonate: 19.9 mmol/L — ABNORMAL LOW (ref 20.0–28.0)
O2 Saturation: 97 %
PCO2 ART: 24.3 mmHg — AB (ref 32.0–48.0)
PO2 ART: 80 mmHg — AB (ref 83.0–108.0)
TCO2: 21 mmol/L (ref 0–100)
pH, Arterial: 7.521 — ABNORMAL HIGH (ref 7.350–7.450)

## 2017-06-28 SURGERY — RIGHT HEART CATH
Anesthesia: LOCAL

## 2017-06-28 MED ORDER — CARVEDILOL 3.125 MG PO TABS: 3.1250 mg | ORAL_TABLET | Freq: Two times a day (BID) | ORAL | 11 refills | Status: AC

## 2017-06-28 MED ORDER — LIDOCAINE HCL (PF) 1 % IJ SOLN
INTRAMUSCULAR | Status: AC
  Filled 2017-06-28: qty 30

## 2017-06-28 MED ORDER — HEPARIN SODIUM (PORCINE) 1000 UNIT/ML IJ SOLN
INTRAMUSCULAR | Status: DC | PRN
  Administered 2017-06-28: 3500 [IU] via INTRAVENOUS

## 2017-06-28 MED ORDER — VERAPAMIL HCL 2.5 MG/ML IV SOLN
INTRAVENOUS | Status: DC | PRN
  Administered 2017-06-28: 10 mL via INTRA_ARTERIAL

## 2017-06-28 MED ORDER — MIDAZOLAM HCL 2 MG/2ML IJ SOLN
INTRAMUSCULAR | Status: DC | PRN
  Administered 2017-06-28: 0.5 mg via INTRAVENOUS

## 2017-06-28 MED ORDER — IOPAMIDOL (ISOVUE-370) INJECTION 76%
INTRAVENOUS | Status: DC | PRN
  Administered 2017-06-28: 50 mL via INTRA_ARTERIAL

## 2017-06-28 MED ORDER — HEPARIN (PORCINE) IN NACL 2-0.9 UNIT/ML-% IJ SOLN
INTRAMUSCULAR | Status: AC
  Filled 2017-06-28: qty 1000

## 2017-06-28 MED ORDER — SODIUM CHLORIDE 0.9% FLUSH: 3.0000 mL | INTRAVENOUS | Status: DC | PRN

## 2017-06-28 MED ORDER — SODIUM CHLORIDE 0.9% FLUSH: 3.0000 mL | Freq: Two times a day (BID) | INTRAVENOUS | Status: DC

## 2017-06-28 MED ORDER — NITROGLYCERIN 1 MG/10 ML FOR IR/CATH LAB
INTRA_ARTERIAL | Status: DC | PRN
  Administered 2017-06-28: 100 ug via INTRACORONARY

## 2017-06-28 MED ORDER — ASPIRIN 81 MG PO CHEW: 81.0000 mg | CHEWABLE_TABLET | ORAL | Status: DC

## 2017-06-28 MED ORDER — SODIUM CHLORIDE 0.9 % IV SOLN: 250.0000 mL | INTRAVENOUS | Status: DC | PRN

## 2017-06-28 MED ORDER — LIDOCAINE HCL (PF) 1 % IJ SOLN
INTRAMUSCULAR | Status: DC | PRN
  Administered 2017-06-28 (×2): 2 mL

## 2017-06-28 MED ORDER — SODIUM CHLORIDE 0.9% FLUSH
3.0000 mL | Freq: Two times a day (BID) | INTRAVENOUS | Status: DC
  Administered 2017-06-28: 3 mL via INTRAVENOUS

## 2017-06-28 MED ORDER — FENTANYL CITRATE (PF) 100 MCG/2ML IJ SOLN
INTRAMUSCULAR | Status: DC | PRN
  Administered 2017-06-28: 25 ug via INTRAVENOUS

## 2017-06-28 MED ORDER — LIDOCAINE HCL 1 % IJ SOLN
INTRAMUSCULAR | Status: AC
  Filled 2017-06-28: qty 20

## 2017-06-28 MED ORDER — SODIUM CHLORIDE 0.9 % IV SOLN: INTRAVENOUS | Status: DC

## 2017-06-28 MED ORDER — APIXABAN 5 MG PO TABS: 5.0000 mg | ORAL_TABLET | Freq: Two times a day (BID) | ORAL | Status: DC

## 2017-06-28 MED ORDER — GUAIFENESIN 100 MG/5ML PO SOLN
5.0000 mL | ORAL | Status: DC | PRN
  Administered 2017-06-28: 100 mg via ORAL
  Filled 2017-06-28 (×2): qty 5

## 2017-06-28 MED ORDER — MIDAZOLAM HCL 2 MG/2ML IJ SOLN
INTRAMUSCULAR | Status: AC
  Filled 2017-06-28: qty 2

## 2017-06-28 MED ORDER — FENTANYL CITRATE (PF) 100 MCG/2ML IJ SOLN
INTRAMUSCULAR | Status: AC
  Filled 2017-06-28: qty 2

## 2017-06-28 MED ORDER — VERAPAMIL HCL 2.5 MG/ML IV SOLN
INTRAVENOUS | Status: AC
  Filled 2017-06-28: qty 2

## 2017-06-28 MED ORDER — HEPARIN SODIUM (PORCINE) 1000 UNIT/ML IJ SOLN
INTRAMUSCULAR | Status: AC
  Filled 2017-06-28: qty 1

## 2017-06-28 MED ORDER — IOPAMIDOL (ISOVUE-370) INJECTION 76%
INTRAVENOUS | Status: AC
  Filled 2017-06-28: qty 100

## 2017-06-28 MED ORDER — SODIUM CHLORIDE 0.9 % IV SOLN
INTRAVENOUS | Status: DC
  Administered 2017-06-28: 13:00:00 via INTRAVENOUS

## 2017-06-28 MED ORDER — HEPARIN (PORCINE) IN NACL 2-0.9 UNIT/ML-% IJ SOLN
INTRAMUSCULAR | Status: AC
  Filled 2017-06-28: qty 500

## 2017-06-28 MED ORDER — HEPARIN (PORCINE) IN NACL 2-0.9 UNIT/ML-% IJ SOLN
INTRAMUSCULAR | Status: AC | PRN
  Administered 2017-06-28: 1000 mL

## 2017-06-28 SURGICAL SUPPLY — 12 items
CATH 5FR JL3.5 JR4 ANG PIG MP (CATHETERS) ×1 IMPLANT
CATH BALLN WEDGE 5F 110CM (CATHETERS) ×1 IMPLANT
DEVICE RAD COMP TR BAND LRG (VASCULAR PRODUCTS) ×1 IMPLANT
GLIDESHEATH SLEND SS 6F .021 (SHEATH) ×1 IMPLANT
GUIDEWIRE INQWIRE 1.5J.035X260 (WIRE) IMPLANT
INQWIRE 1.5J .035X260CM (WIRE) ×2
KIT HEART LEFT (KITS) ×2 IMPLANT
PACK CARDIAC CATHETERIZATION (CUSTOM PROCEDURE TRAY) ×2 IMPLANT
SHEATH GLIDE SLENDER 4/5FR (SHEATH) ×1 IMPLANT
TRANSDUCER W/STOPCOCK (MISCELLANEOUS) ×2 IMPLANT
TUBING CIL FLEX 10 FLL-RA (TUBING) ×2 IMPLANT
WIRE HI TORQ VERSACORE-J 145CM (WIRE) ×2 IMPLANT

## 2017-06-28 NOTE — Progress Notes (Signed)
Site area: right brachial vein sheath  Site Prior to Removal:  Level 0 Pressure Applied For:15 Manual:  yes  Patient Status During Pull:  vss Post Pull Site:  Level 0 Post Pull Instructions Given:  yes Post Pull Pulses Present: yes Dressing Applied:  Tegaderm/ pressure dressing  Bedrest begins @ na  Comments:

## 2017-06-28 NOTE — Interval H&P Note (Signed)
History and Physical Interval Note:  06/28/2017 2:44 PM  Robert Carey  has presented today for cardiac catheterization, with the diagnosis of cardiomyopathy. The various methods of treatment have been discussed with the patient and family. After consideration of risks, benefits and other options for treatment, the patient has consented to  Procedure(s): RIGHT HEART CATH (N/A) CORONARY ANGIOGRAPHY (N/A) as a surgical intervention .  The patient's history has been reviewed, patient examined, no change in status, stable for surgery.  I have reviewed the patient's chart and labs.  Questions were answered to the patient's satisfaction.    Cath Lab Visit (complete for each Cath Lab visit)  Clinical Evaluation Leading to the Procedure:   ACS: No.  Non-ACS:    Anginal Classification: CCS IV (heart failure symptoms, NYHA class IV)  Anti-ischemic medical therapy: No Therapy  Non-Invasive Test Results: No non-invasive testing performed (LVEF 20-25%)  Prior CABG: No previous CABG  [name]

## 2017-06-28 NOTE — Discharge Instructions (Signed)

## 2017-06-28 NOTE — H&P (View-Only) (Signed)
Cardiology Office Note   Date:  06/25/2017   ID:  [name], DOB 01/08/1946, MRN [id number]  PCP:  Robert Noble, MD  Cardiologist:   Robert Carey chief complaint on file.     History of Present Illness: Robert Carey is a 71 y.o. male who presents for consultation regarding CAD risk Referred by Robert Carey Patient has distal PVD. Seen by Robert Carey 03/2017 noted records from Robert Carey and [facility] IR. Had non healing ulcer right great toe ABI"s over one with occluded right PT artery which was "recanalized" He is a retired Forensic psychologist. Family history with father MI early age. Non smoker No DM or hyperlipidemia. Has had issues with seizures which affect memory. Ankylosing Spondylitis  Echo done by Robert Carey 06/21/17 for dyspnea Showed EF 20-25% Restrictive physiology ? Apical thrombus Akinesis of apex and lateral wall he has no chest pain. No TIA or embolic events started on eliquis  His has had recent failure to thrive. 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 He has had weight loss and esophageal dysmotility with aspiration and recent stretching of his esophagus He is supposed to have f/u endoscopy at end of month with Robert Robert Carey discussion with mostly wife as he does not follow as well. She had lots of intelligent questions and has accessed all his records in my chart. She understands diagnosis of CHF and issues with decreased EF and need for cath to risk stratify  Unable to schedule with Robert Carey for more than two weeks   Past Medical History:  Diagnosis Date  . Arthritis   . AS (ankylosing spondylitis) (HCC)   . Aspiration pneumonia ([place]) 05/2017  . Cataract   . CREST syndrome ([place])   . Dementia   . Diverticulosis of colon (without mention of hemorrhage)   . Esophageal reflux   . Gastroparesis   . Hiatal hernia   . Mixed connective tissue disease ([place])   . Motility disorder, esophageal     . Raynauds phenomenon   . Scleroderma ([place])   . Seizure disorder ([place])   . Seizures ([place])    07/2016  . Skin ulcers of foot, bilateral ([place]) 01/03/2017   R foot only  . Spondylitis, ankylosing ([place])   . Stricture and stenosis of esophagus     Past Surgical History:  Procedure Laterality Date  . ESOPHAGOGASTRODUODENOSCOPY (EGD) WITH PROPOFOL N/A 06/07/2017   Procedure: ESOPHAGOGASTRODUODENOSCOPY (EGD) WITH PROPOFOL;  Surgeon: Robert Gunning, MD;  Location: [facility];  Service: Gastroenterology;  Laterality: N/A;  . IR GENERIC HISTORICAL  02/07/2017   IR TIB-PERO ART UNI PTA EA ADD VESSEL MOD SED 02/07/2017 Robert Mckusick, DO [facility]  . IR GENERIC HISTORICAL  02/07/2017   IR US GUIDE VASC ACCESS LEFT 02/07/2017 Robert Mckusick, DO [facility]  . IR GENERIC HISTORICAL  02/07/2017   IR TIB-PERO ART PTA MOD SED 02/07/2017 Robert Mckusick, DO [facility]  . IR GENERIC HISTORICAL  02/07/2017   IR ANGIOGRAM SELECTIVE EACH ADDITIONAL VESSEL 02/07/2017 Robert Mckusick, DO [facility]  . IR GENERIC HISTORICAL  02/07/2017   IR ANGIOGRAM EXTREMITY RIGHT 02/07/2017 Robert Mckusick, DO [facility]  . IR GENERIC HISTORICAL  02/07/2017   IR THROMBECT PRIM MECH INIT (INCLU) MOD SED 02/07/2017 Robert Mckusick, DO [facility]  . IR RADIOLOGIST EVAL & MGMT  02/27/2017  . IR RADIOLOGIST  EVAL & MGMT  01/22/2017  . IR THORACENTESIS ASP PLEURAL SPACE W/IMG GUIDE  06/07/2017  . NOSE SURGERY  2005  . SAVORY DILATION N/A 06/07/2017   Procedure: SAVORY DILATION;  Surgeon: Robert Gunning, MD;  Location: [facility] ENDOSCOPY;  Service: Gastroenterology;  Laterality: N/A;  . THUMB ARTHROSCOPY Left   . TOE SURGERY     right great toe  . WISDOM TOOTH EXTRACTION       Current Outpatient Prescriptions  Medication Sig Dispense Refill  . acetaminophen (TYLENOL) 500 MG tablet Take 500 mg by mouth every 6 (six) hours as needed.    Marland Kitchen apixaban (ELIQUIS) 5 MG TABS tablet Take 5 mg by mouth 2 (two) times daily.    Marland Kitchen  aspirin EC 81 MG tablet Take 81 mg by mouth daily.    . Calcium Carbonate-Vitamin D (CALCARB 600/D PO) Take 1 tablet by mouth daily.     . clobetasol cream (TEMOVATE) 1.61 % Apply 1 application topically 2 (two) times daily as needed (ECZEMA).     . furosemide (LASIX) 20 MG tablet Take 1 tablet by mouth daily.    . hydroxychloroquine (PLAQUENIL) 200 MG tablet Take 200 mg by mouth 2 (two) times daily with a meal.     . ketoconazole (NIZORAL) 2 % cream Apply 1 application topically 2 (two) times daily as needed for irritation.     . levETIRAcetam (KEPPRA) 250 MG tablet Take 375-500 mg by mouth See admin instructions. Takes two 750mg  tablets in the morning for a total of 1500mg  daily in the morning, 375 mg at noon, Takes two 250mg  tablets (500 mg) daily in the evening at 7pm, and 750 mg at 9p    . levETIRAcetam (KEPPRA) 750 MG tablet Take 750-1,500 mg by mouth See admin instructions. Takes two 750mg  tablets in the morning for a total of 1500mg  daily in the morning, 375 mg at noon, Takes two 250mg  tablets (500 mg) daily in the evening at 7pm, and 750 mg at 9p    . Multiple Vitamin (MULTIVITAMIN) capsule Take 1 capsule by mouth daily.    . Na Sulfate-K Sulfate-Mg Sulf 17.5-3.13-1.6 GM/180ML SOLN Suprep as directed.  No substitutions. 354 mL 0  . NIFEdipine (PROCARDIA XL/ADALAT-CC) 60 MG 24 hr tablet Take 60 mg by mouth every evening.     . NONFORMULARY OR COMPOUNDED ITEM Apply 1 application topically 2 (two) times daily as needed. Apply to neck twice daily as needed. Meloxicam, lidocaine, topiramate, prilocaine    . NONFORMULARY OR COMPOUNDED ITEM Apply 1 application topically 4 (four) times daily as needed. Apply to right foot as needed. Ketoconazole, diclofenac, baclofen, lidocaine, gabapentin    . omeprazole (PRILOSEC) 40 MG capsule TAKE (1) CAPSULE BY MOUTH ONCE DAILY. (Patient taking differently: TAKE 40MG  BY MOUTH ONCE DAILY.) 30 capsule 3  . OXcarbazepine (TRILEPTAL) 150 MG tablet TAKE 300MG  IN THE  MORNING AND 450MG  IN THE EVENING    . predniSONE (DELTASONE) 5 MG tablet Take 5 mg by mouth daily.    . predniSONE (STERAPRED UNI-PAK 48 TAB) 5 MG (48) TBPK tablet Take 1 tablet by mouth as directed.    . Probiotic Product (PROBIOTIC PO) Take 1 tablet by mouth daily.    . silver sulfADIAZINE (SILVADENE) 1 % cream Apply 1 application topically daily as needed.      No current facility-administered medications for this visit.     Allergies:   Dilantin [phenytoin sodium extended] and Phenytoin sodium extended    Social History:  The  patient  reports that he has never smoked. He has never used smokeless tobacco. He reports that he does not drink alcohol or use drugs.   Family History:  The patient's family history includes Breast cancer in his mother; COPD in his father; Diabetes in his mother; Heart disease in his father and mother; Melanoma in his sister; Pancreatic cancer in his mother; Stroke in his brother.    ROS:  Please see the history of present illness.   Otherwise, review of systems are positive for none.   All other systems are reviewed and negative.    PHYSICAL EXAM: VS:  BP 108/74   Pulse 68   Ht 5\' 11"  (1.803 m)   Wt 153 lb (69.4 kg)   BMI 21.34 kg/m  , BMI Body mass index is 21.34 kg/m. Memory poor  Chronically ill male  HEENT: reduced cervical neck curvature and mobility  Neck supple with no adenopathy JVP normal no bruits no thyromegaly Lungs clear with no wheezing and good diaphragmatic motion Heart:  S1/S2 MR murmur, no rub, gallop or click PMI normal Abdomen: benighn, BS positve, no tenderness, no AAA no bruit.  No HSM or HJR Distal pulses intact with no bruits Trace bilateral ankle edema Neuro non-focal Skin warm and dry No muscular weakness Arthritic changes in hands    EKG:  03/26/17  SR T wave inversions I,AVL otherwise normal    Recent Labs: 06/06/2017: ALT 25 06/08/2017: BUN 15; Creatinine, Ser 1.02; Hemoglobin 12.7; Platelets 223; Potassium 3.4;  Sodium 139    Lipid Panel No results Carey for: CHOL, TRIG, HDL, CHOLHDL, VLDL, LDLCALC, LDLDIRECT    Wt Readings from Last 3 Encounters:  06/25/17 153 lb (69.4 kg)  06/06/17 183 lb (83 kg)  06/04/17 162 lb (73.5 kg)      Other studies Reviewed: Additional studies/ records that were reviewed today include: Notes Robert Carey Angiogram Robert Earleen Newport Notes [facility] from neurology.    ASSESSMENT AND PLAN:  1.  PVD: on plavix f/u Robert Carey appears to have more calcified distal tibial vessel disease  2. Seizures:  On keppra f/u [facility] 3. Ankylosing Spondylitis:  Advised not to use celebrex for pain PT/OT 4. CHF:  Not clear how much of clinical dyspnea and fatigue is from aspiration and Ankylosing Spondylitis with failure to thrive Needs right heart with cors no LV gram to r/o CAD and assess risk going forward. Have arranged for [facility] on Friday Can start coreg and ACE post cath pending results of right heart and angiogram. Will hold eliquis on Wendsday   Current medicines are reviewed at length with the patient today.  The patient does not have concerns regarding medicines.  The following changes have been made:  Hold eliquis 48 hours before cath  Labs/ tests ordered today include: Pre cath   Orders Placed This Encounter  Procedures  . Basic metabolic panel  . CBC  . Protime-INR  . APTT     Disposition:   FU with Robert Carey post cath     Signed, Robert Rouge, MD  06/25/2017 3:24 PM    [facility] [address], [place], [place]  [address] Phone: [phone]; Fax: [phone]

## 2017-06-28 NOTE — Brief Op Note (Signed)
Brief Cardiac Catheterization Note  Date: 06/28/2017 Time: 3:38 PM  PATIENT:  Robert Carey  71 y.o. male  PRE-OPERATIVE DIAGNOSIS:  cardiomyopathy  POST-OPERATIVE DIAGNOSIS:  Severe multivessel coronary artery disease, ischemic cardiomyoapthy  PROCEDURE:  Procedure(s): RIGHT HEART CATH (N/A) CORONARY ANGIOGRAPHY (N/A)  SURGEON:  Surgeon(s) and Role:    * Kathryn Linarez, Harrell Gave, MD - Primary  FINDINGS: 1. Severe diffuse small vessel disease affecting all three major coronary arteries. 2. Upper normal to mildly elevated left and right heart filling pressures. 3. Mild pulmonary hypertension. 4. Low normal Fick cardiac output/index.  RECOMMENDATIONS: 1. Medical therapy for ischemic cardiomyopathy. There are no surgical or PCI targets for revascularization. 2. Start carvedilol 3.125 mg BID; continue furosemide. 3. Restart apixaban tomorrow morning if no bleeding at catheterization sites.  Nelva Bush, MD [facility] Pager: [phone]

## 2017-07-01 ENCOUNTER — Encounter (HOSPITAL_COMMUNITY): Payer: Self-pay | Admitting: Internal Medicine

## 2017-07-01 ENCOUNTER — Telehealth: Payer: Self-pay

## 2017-07-01 NOTE — Telephone Encounter (Signed)
Looking at this gentleman's chart, he was a follow up EGD to be done at [facility] from Dr. Havery Moros. I scheduled these appointments on 06/12/17, looks like his echo and cath were done after all this was scheduled.  I will have to check with Dr. Havery Moros about when to reschedule him, most definitely will be at the hospital. Thanks.

## 2017-07-01 NOTE — Progress Notes (Signed)
Cardiology Office Note   Date:  07/02/2017   ID:  Robert Carey, DOB 1946-05-09, MRN [id number]  PCP:  Asencion Noble, MD  Cardiologist: Gwenlyn Found  Chief Complaint  Patient presents with  . Coronary Artery Disease  . Congestive Heart Failure      History of Present Illness: Robert Carey is a 71 y.o. male who presents for posthospitalization after having right and left heart catheterization on 06/28/2017. The patient has a history of PAD with a right PT artery which was "recannulize", family history of CAD, most recent echocardiogram on 06/21/2017 completed for dyspnea which showed ejection fraction of 20% to 25% with questionable restrictive physiology. Other history includes seizures which does affect his memory recall.  He was seen by Dr. Johnsie Cancel on 06/25/2017 and plan for cardiac catheterization in the setting of reduced EF. Conclusion   Conclusions: 1. Severe diffuse small vessel disease involving the LAD, LCx, and RCA. 2. Mildly elevated left and right heart filling pressures. 3. Mild pulmonary hypertension. 4. Low normal Fick cardiac output.  Recommendations: 1. Aggressive medical therapy of severe systolic heart failure due to ischemic cardiomyopathy. There are no suitable targets for surgical or percutaneous revascularization. 2. Continue diuresis. 3. Start carvedilol 3.125 mg twice a day. 4. Outpatient follow-up in one week. I will also refer Mr. Gladu to the advanced heart failure clinic. 5. Restart apixaban tomorrow if no evidence of leading at catheterization site.   Echocardiogram 06/21/2017  Left ventricle: The cavity size was normal. Wall thickness was   increased in a pattern of mild LVH. Systolic function was   severely reduced. The estimated ejection fraction was in the   range of 20% to 25%. Diffuse hypokinesis. Doppler parameters are   consistent with restrictive physiology, indicative of decreased   left ventricular diastolic compliance and/or increased  left   atrial pressure. Doppler parameters are consistent with high   ventricular filling pressure. There is an apical thrombus   present. - Regional wall motion abnormality: Akinesis of the apical lateral   and apical myocardium. - Aortic valve: Valve area (VTI): 2.1 cm^2. Valve area (Vmax): 2.14   cm^2. - Mitral valve: There was moderate regurgitation. The MR VC is 0.4   cm. - Left atrium: The atrium was mildly dilated. - Right ventricle: The cavity size was mildly dilated. Systolic   function was mildly reduced. - Atrial septum: No defect or patent foramen ovale was identified. - Pulmonary arteries: Systolic pressure was mildly increased. PA   peak pressure: 38 mm Hg (S). - Technically difficult study, echocontrast was used to enhance   visualization.  He is here today with his with her a type-written list of questions. The patient has multiple medical problems, has been followed by multiple physicians. The patient has been referred to the advanced heart failure clinic and has an appointment scheduled on September 11.  He comes today with a frequent cough. This in his throat. He denies any lung congestion. He has chronic dyspnea on exertion. He denies bleeding problems, dizziness, he does have some mild PND, but is able to sleep through the night. He continues on prednisone.   Due to dysphagia, the patient is going to be seen by GI and is going to have an EGD completed sometime in October. They plan on doing this as an inpatient, due to his reduced EF. The patient is lost 10 pounds as he is unable to swallow food well.  Past Medical History:  Diagnosis Date  . Arthritis   .  AS (ankylosing spondylitis) (HCC)   . Aspiration pneumonia ([place]) 05/2017  . Cataract   . CREST syndrome ([place])   . Dementia   . Diverticulosis of colon (without mention of hemorrhage)   . Esophageal reflux   . Gastroparesis   . Hiatal hernia   . Mixed connective tissue disease ([place])   . Motility disorder,  esophageal   . Raynauds phenomenon   . Scleroderma ([place])   . Seizure disorder ([place])   . Seizures ([place])    07/2016  . Skin ulcers of foot, bilateral ([place]) 01/03/2017   R foot only  . Spondylitis, ankylosing ([place])   . Stricture and stenosis of esophagus     Past Surgical History:  Procedure Laterality Date  . CORONARY ANGIOGRAPHY N/A 06/28/2017   Procedure: CORONARY ANGIOGRAPHY;  Surgeon: Nelva Bush, MD;  Location: [facility];  Service: Cardiovascular;  Laterality: N/A;  . ESOPHAGOGASTRODUODENOSCOPY (EGD) WITH PROPOFOL N/A 06/07/2017   Procedure: ESOPHAGOGASTRODUODENOSCOPY (EGD) WITH PROPOFOL;  Surgeon: Manus Gunning, MD;  Location: [facility];  Service: Gastroenterology;  Laterality: N/A;  . IR GENERIC HISTORICAL  02/07/2017   IR TIB-PERO ART UNI PTA EA ADD VESSEL MOD SED 02/07/2017 Corrie Mckusick, DO [facility]  . IR GENERIC HISTORICAL  02/07/2017   IR US GUIDE VASC ACCESS LEFT 02/07/2017 Corrie Mckusick, DO [facility]  . IR GENERIC HISTORICAL  02/07/2017   IR TIB-PERO ART PTA MOD SED 02/07/2017 Corrie Mckusick, DO [facility]  . IR GENERIC HISTORICAL  02/07/2017   IR ANGIOGRAM SELECTIVE EACH ADDITIONAL VESSEL 02/07/2017 Corrie Mckusick, DO [facility]  . IR GENERIC HISTORICAL  02/07/2017   IR ANGIOGRAM EXTREMITY RIGHT 02/07/2017 Corrie Mckusick, DO [facility]  . IR GENERIC HISTORICAL  02/07/2017   IR THROMBECT PRIM MECH INIT (INCLU) MOD SED 02/07/2017 Corrie Mckusick, DO [facility]  . IR RADIOLOGIST EVAL & MGMT  02/27/2017  . IR RADIOLOGIST EVAL & MGMT  01/22/2017  . IR THORACENTESIS ASP PLEURAL SPACE W/IMG GUIDE  06/07/2017  . NOSE SURGERY  2005  . RIGHT HEART CATH N/A 06/28/2017   Procedure: RIGHT HEART CATH;  Surgeon: Nelva Bush, MD;  Location: [facility];  Service: Cardiovascular;  Laterality: N/A;  . SAVORY DILATION N/A 06/07/2017   Procedure: SAVORY DILATION;  Surgeon: Manus Gunning, MD;  Location: [facility];  Service: Gastroenterology;   Laterality: N/A;  . THUMB ARTHROSCOPY Left   . TOE SURGERY     right great toe  . WISDOM TOOTH EXTRACTION       Current Outpatient Prescriptions  Medication Sig Dispense Refill  . acetaminophen (TYLENOL) 650 MG CR tablet Take 1,300 mg by mouth 2 (two) times daily as needed for pain.    Marland Kitchen apixaban (ELIQUIS) 5 MG TABS tablet Take 1 tablet (5 mg total) by mouth 2 (two) times daily. If no bleeding or swelling at catheterization sites, restart on morning of 06/29/17. 60 tablet   . aspirin EC 81 MG tablet Take 81 mg by mouth daily.    . Calcium Carbonate-Vitamin D (CALCARB 600/D PO) Take 1 tablet by mouth daily.     . carvedilol (COREG) 3.125 MG tablet Take 1 tablet (3.125 mg total) by mouth 2 (two) times daily. 60 tablet 11  . clobetasol cream (TEMOVATE) 1.61 % Apply 1 application topically 2 (two) times daily as needed (ECZEMA).     . furosemide (LASIX) 20 MG tablet Take 20 mg by mouth daily.     . hydroxychloroquine (PLAQUENIL) 200 MG  tablet Take 200 mg by mouth 2 (two) times daily with a meal.     . ketoconazole (NIZORAL) 2 % cream Apply 1 application topically 2 (two) times daily as needed for irritation.     . levETIRAcetam (KEPPRA) 250 MG tablet Take 500 mg by mouth daily at 8 pm. 1900    . levETIRAcetam (KEPPRA) 750 MG tablet Take 375-1,500 mg by mouth See admin instructions. Take 2 tablets (1500 mg) in the morning, 0.5 tablet (375 mg) at noon, & 1 tablet (750 mg) at 2100.    . Multiple Vitamin (MULTIVITAMIN) capsule Take 1 capsule by mouth daily.    . Na Sulfate-K Sulfate-Mg Sulf 17.5-3.13-1.6 GM/180ML SOLN Suprep as directed.  No substitutions. 354 mL 0  . NIFEdipine (PROCARDIA XL/ADALAT-CC) 60 MG 24 hr tablet Take 60 mg by mouth every evening.     . NONFORMULARY OR COMPOUNDED ITEM Apply 1 application topically 2 (two) times daily as needed. Apply to neck twice daily as needed. Meloxicam, lidocaine, topiramate, prilocaine    . omeprazole (PRILOSEC) 40 MG capsule TAKE (1) CAPSULE BY MOUTH  ONCE DAILY. (Patient taking differently: TAKE 1 CAPSULE (40 MG) ONCE DAILY BEFORE BREAKFAST) 30 capsule 3  . OXcarbazepine (TRILEPTAL) 150 MG tablet Take 300-450 mg by mouth 2 (two) times daily. TAKE 2 TABLETS (300 MG) IN THE MORNING AT 0900 & 3 TABLETS (450 MG) IN THE EVENING AT 2100    . predniSONE (DELTASONE) 5 MG tablet Take 5 mg by mouth daily. (0900)    . Probiotic Product (PROBIOTIC PO) Take 1 tablet by mouth daily.     No current facility-administered medications for this visit.     Allergies:   Dilantin [phenytoin sodium extended] and Phenytoin sodium extended    Social History:  The patient  reports that he has never smoked. He has never used smokeless tobacco. He reports that he does not drink alcohol or use drugs.   Family History:  The patient's family history includes Breast cancer in his mother; COPD in his father; Diabetes in his mother; Heart disease in his father and mother; Melanoma in his sister; Pancreatic cancer in his mother; Stroke in his brother.    ROS: All other systems are reviewed and negative. Unless otherwise mentioned in H&P    PHYSICAL EXAM: VS:  Ht 5\' 11"  (1.803 m)   Wt 152 lb (68.9 kg)   BMI 21.20 kg/m  , BMI Body mass index is 21.2 kg/m. GEN: Well nourished, well developed, in no acute distress thin.  HEENT: normal  Neck: no JVD, carotid bruits, or masses Cardiac: RRR; no murmurs, rubs, or gallops,no edema  Respiratory:  Clear to auscultation bilaterally, normal work of breathing. Frequent upper airway coughing. GI: soft, nontender, nondistended, + BS MS: no deformity or atrophy  Skin: warm and dry, no rash Neuro:  Strength and sensation are intact Psych: euthymic mood, full affect   Wt Readings from Last 3 Encounters:  07/02/17 152 lb (68.9 kg)  06/28/17 153 lb (69.4 kg)  06/25/17 153 lb (69.4 kg)      ASSESSMENT AND PLAN:  1. CAD: Recent cardiac catheterization revealing no suitable targets for surgical or percutaneous  revascularization, he has severe diffuse small vessel disease involving the LAD circumflex and right coronary artery. He also has elevated left and right filling pressures with mild pulmonary hypertension. The patient is currently on carvedilol 3.125 mg twice a day.   2. Chronic Systolic CHF: Most recent EF 20-25%.  He is not on  ACE inhibitor or Entresto at this time. Uncertain if he would be a good candidate for this as he also has issues with esophageal dysmotility. He is due to have an EGD to evaluate for varices. Currently he remains on nifedipine. With reduced EF uncertain at this calcium channel blocker would be appropriate.  He will continue Lasix, daily weights, salt avoidance.   3. History of aspiration pneumonia: Recently hospitalized for same. Continues to have an upper airway atypical cough. He uses a pillow to splint himself as he is coughing several times a minute. Lungs are clear. I'm going to repeat his chest x-ray to make sure that there is no issues with continued pneumonia or CHF. Weight is been decreasing but he is unable to eat well due to his swallowing issues. I'm going to provide him with Tessalon Perles 100 mg, one to 2 tablets 3 times a day when necessary frequent coughing. I've also advised him to try some over-the-counter antihistamine like Claritin or Zyrtec as he also continues dyspnea a lot and cough after.  4. Esophageal dysmotility: He is okay from a cardiac standpoint. ELIQUIS for 48 hours prior to procedure. This binder standing this will be completed as an inpatient, due to reduced EF.   Current medicines are reviewed at length with the patient today.    Labs/ tests ordered today include:  Phill Myron. West Pugh, ANP, AACC   07/02/2017 1:03 PM    [facility] [address] [place], [place] [address] Phone: [phone]; Fax: [phone]

## 2017-07-01 NOTE — Telephone Encounter (Signed)
Almyra Free,      Tomorrow would you please f/u concerning ELIQUIS and [facility] case due to low EF Thanks, Levada Dy.PV

## 2017-07-02 ENCOUNTER — Ambulatory Visit (HOSPITAL_COMMUNITY)
Admission: RE | Admit: 2017-07-02 | Discharge: 2017-07-02 | Disposition: A | Discharge status: Home / Self Care | Payer: PPO | Source: Ambulatory Visit | Attending: Adult Health | Admitting: Adult Health

## 2017-07-02 ENCOUNTER — Encounter: Payer: Self-pay | Admitting: Adult Health

## 2017-07-02 ENCOUNTER — Ambulatory Visit (INDEPENDENT_AMBULATORY_CARE_PROVIDER_SITE_OTHER): Payer: PPO | Admitting: Adult Health

## 2017-07-02 VITALS — BP 100/70 | HR 82 | Ht 71.0 in | Wt 152.0 lb

## 2017-07-02 DIAGNOSIS — J9811 Atelectasis: Secondary | ICD-10-CM | POA: Diagnosis not present

## 2017-07-02 DIAGNOSIS — I2510 Atherosclerotic heart disease of native coronary artery without angina pectoris: Secondary | ICD-10-CM | POA: Diagnosis not present

## 2017-07-02 DIAGNOSIS — I517 Cardiomegaly: Secondary | ICD-10-CM | POA: Diagnosis not present

## 2017-07-02 DIAGNOSIS — I43 Cardiomyopathy in diseases classified elsewhere: Secondary | ICD-10-CM

## 2017-07-02 DIAGNOSIS — J189 Pneumonia, unspecified organism: Secondary | ICD-10-CM

## 2017-07-02 MED ORDER — BENZONATATE 100 MG PO CAPS: 100.0000 mg | ORAL_CAPSULE | Freq: Three times a day (TID) | ORAL | 0 refills | Status: AC

## 2017-07-02 MED ORDER — APIXABAN 5 MG PO TABS: 5.0000 mg | ORAL_TABLET | Freq: Two times a day (BID) | ORAL | 6 refills | Status: AC

## 2017-07-02 NOTE — Patient Instructions (Signed)
Medication Instructions:  Your physician has recommended you make the following change in your medication:  Start Eliquis 5 mg Two Times Daily  Tessalon Perles One Three Times Daily  Take over the counter Allegra   Labwork: NONE  Testing/Procedures: Chest X-Ray  Follow-Up: Your physician recommends that you schedule a follow-up appointment with Dr. Aundra Dubin    Any Other Special Instructions Will Be Listed Below (If Applicable).    If you need a refill on your cardiac medications before your next appointment, please call your pharmacy.

## 2017-07-02 NOTE — Telephone Encounter (Signed)
Chart reviewed. EF most recently 20-25% with severe CAD, treated medically, now on Eliquis and aspirin (previously on plavix, Almyra Free can you confirm he is off plavix?). He cannot have endoscopy at the [facility] and his procedure there should be cancelled. He is higher risk for anesthesia, any procedure would have to be done at the hospital. Almyra Free can you contact his wife and see if he still wishes to have endoscopy - this is to be done to dilate proximal esophagus, it significantly helped his dysphagia when he was hospitalized. If he is eating okay we can hold off on this. If he still wants to have it done, his Eliquis will need to be held for 48 hours prior to the procedure and will need cardiology clearance to proceed and be able to hold Eliquis. If they don't think he can handle the procedure at this time it will need to be delayed. Thanks

## 2017-07-03 ENCOUNTER — Other Ambulatory Visit: Payer: Self-pay

## 2017-07-03 DIAGNOSIS — R1310 Dysphagia, unspecified: Secondary | ICD-10-CM

## 2017-07-03 NOTE — Telephone Encounter (Signed)
Spoke to patient's wife, let her know it's scheduled for 08/27/17 at [facility]. Mailed prep instructions to them. I let her know that I will continue to watch for openings. They understand to hold Eliquis 48 hours prior to procedure.

## 2017-07-03 NOTE — Telephone Encounter (Signed)
Can you please give me the CPT codes for this patient's to be scheduled procedure. I need also the dilation ones. Looking at the schedule it could be scheduled for 10/9 but his wife said that he is not doing well. He went to cardiologist yesterday, he is on Eliquis and off of Plavix. They did address coming off of Eliquis and okay to have this procedure, please see their note. Thanks.

## 2017-07-03 NOTE — Progress Notes (Signed)
done

## 2017-07-03 NOTE — Telephone Encounter (Signed)
CPT code is 43248.  We can see how he feels at it gets closer to time for dilation, in regards to whether or not he wishes to proceed. He will have to hold Eliquis for a dilation. If he is swallowing okay in the interim he doesn't have to do this, only if he feels it will help him (it significantly helped him the first time).

## 2017-07-05 DIAGNOSIS — M450 Ankylosing spondylitis of multiple sites in spine: Secondary | ICD-10-CM | POA: Diagnosis not present

## 2017-07-05 DIAGNOSIS — I7300 Raynaud's syndrome without gangrene: Secondary | ICD-10-CM | POA: Diagnosis not present

## 2017-07-05 DIAGNOSIS — M810 Age-related osteoporosis without current pathological fracture: Secondary | ICD-10-CM | POA: Diagnosis not present

## 2017-07-05 DIAGNOSIS — R296 Repeated falls: Secondary | ICD-10-CM | POA: Diagnosis not present

## 2017-07-05 DIAGNOSIS — Z6821 Body mass index (BMI) 21.0-21.9, adult: Secondary | ICD-10-CM | POA: Diagnosis not present

## 2017-07-05 DIAGNOSIS — M351 Other overlap syndromes: Secondary | ICD-10-CM | POA: Diagnosis not present

## 2017-07-05 DIAGNOSIS — M542 Cervicalgia: Secondary | ICD-10-CM | POA: Diagnosis not present

## 2017-07-05 DIAGNOSIS — M25531 Pain in right wrist: Secondary | ICD-10-CM | POA: Diagnosis not present

## 2017-07-05 DIAGNOSIS — L97511 Non-pressure chronic ulcer of other part of right foot limited to breakdown of skin: Secondary | ICD-10-CM | POA: Diagnosis not present

## 2017-07-09 ENCOUNTER — Other Ambulatory Visit: Payer: Self-pay

## 2017-07-09 ENCOUNTER — Telehealth: Payer: Self-pay | Admitting: Adult Health

## 2017-07-09 ENCOUNTER — Telehealth: Payer: Self-pay | Admitting: Gastroenterology

## 2017-07-09 NOTE — Telephone Encounter (Signed)
Patient's wife is calling regarding medication for patient's cough and SOB / tg

## 2017-07-09 NOTE — Telephone Encounter (Signed)
FYI a patient scheduled for [facility] EGD on 8/31 had to reschedule due family illness. I have moved [name] to this appointment, verbally went over instructions with patient's wife, to stop Eliquis 2 days prior, last dose evening of 07/16/17. Prep instructions mailed to patient.

## 2017-07-09 NOTE — Telephone Encounter (Signed)
If he can't eat anything or take pills and is out of town, his options are to go to the nearest emergency room for evaluation. He will need an endoscopy with dilation eventually but his Eliquis would need to be held a few days, so he can't have endoscopy done urgently. If he is having worsening breathing / coughing, he could have aspirated again and may need a CXR to have this evaluated as well. If he is feeling that poorly while out of town that is what I recommend. Otherwise if he wants to come back to this area so we can take care of him, I unfortunately don't have any Endo appointments this week. His case will need to be done at the hospital given his comorbidities but he needs to see [facility] for cxr and ensure his heart is okay - may be best to go to the ER regardless if he is having cardiopulmonary symptoms. Let me know what they think - if he can't tolerating anything PO he will need to be admitted

## 2017-07-09 NOTE — Telephone Encounter (Signed)
Spoke to patient's wife, [place]. She states he is starting to have a difficult time swallowing even small medication. He is doing chin tuck to swallow, taking sips of water in between. Patient is having early satiety eating very little, getting weaker, increased coughing. Patient also on Eliquis. Currently scheduled at [facility] on 10/9 for EGD with dilation.

## 2017-07-09 NOTE — Telephone Encounter (Signed)
Patient's wife also had a call into cardiologist's office, they did not feel it was heart related. She said that he is still able to get liquids and and his medication down. I instructed her to take him to the ED if he cannot eat/drink/take medication. Advised to have CXR to evaluate for aspiration. She did not want to go to the ED just yet, she is very frustrated that no appointment is available. I let her know that I am keeping check on the hospital schedule and if an opening becomes available than I would move him up earlier.

## 2017-07-09 NOTE — Telephone Encounter (Signed)
Wife states pt sleeping a lot, says tessalon pearls and zyrtec not working.Has codeine cough syrup she has from Dr Willey Blade. She states he had gained 1 1/2 lbs but his ankle measurements are the same.[place] and she is concerned about all of his medical, neurological, GI issuses.  She wonders if you have any suggestions, could cough be heart related and what should she do.His recent heart diagnosis has her afraid as this is all new to her.I told her she can call us anytime, we have staff after hours if she needs to call.She is awaiting a call back form GI.

## 2017-07-09 NOTE — Telephone Encounter (Signed)
That's perfect thanks Robert Carey. Unfortunately his case has to be done at the hospital so less options open for scheduling, but 8/31 should work well. Thanks

## 2017-07-11 ENCOUNTER — Encounter (HOSPITAL_COMMUNITY): Payer: Self-pay | Admitting: *Deleted

## 2017-07-11 NOTE — Telephone Encounter (Signed)
He can take one extra dose of lasix 20 mg to see if this helps with his weight gain. Just one dose with one extra potassium. Please order a CT scan of chest for when he gets back. He will need a BMET prior to it as I want it with contrast please.

## 2017-07-11 NOTE — Telephone Encounter (Signed)
I have reviewed most recent CXR from last week. No edema or pneumonia. Uncertain why he continues to have the coughing. The codeine in the cough syrup can make him sleepy. If she is more concerned, she can have him seen there locally at the hospital there for more thorough evaluation. Otherwise would see Dr. Willey Blade as soon as they get back. May need chest CT.

## 2017-07-12 ENCOUNTER — Inpatient Hospital Stay (HOSPITAL_COMMUNITY)
Admission: EM | Admit: 2017-07-12 | Discharge: 2017-07-20 | DRG: 291 | Disposition: E | Payer: PPO | Attending: Internal Medicine | Admitting: Internal Medicine

## 2017-07-12 ENCOUNTER — Other Ambulatory Visit: Payer: Self-pay

## 2017-07-12 ENCOUNTER — Emergency Department (HOSPITAL_COMMUNITY): Payer: PPO

## 2017-07-12 ENCOUNTER — Telehealth: Payer: Self-pay | Admitting: Cardiology

## 2017-07-12 DIAGNOSIS — I255 Ischemic cardiomyopathy: Secondary | ICD-10-CM | POA: Diagnosis present

## 2017-07-12 DIAGNOSIS — Z803 Family history of malignant neoplasm of breast: Secondary | ICD-10-CM

## 2017-07-12 DIAGNOSIS — Z833 Family history of diabetes mellitus: Secondary | ICD-10-CM

## 2017-07-12 DIAGNOSIS — G40409 Other generalized epilepsy and epileptic syndromes, not intractable, without status epilepticus: Secondary | ICD-10-CM | POA: Diagnosis present

## 2017-07-12 DIAGNOSIS — R0602 Shortness of breath: Secondary | ICD-10-CM | POA: Diagnosis not present

## 2017-07-12 DIAGNOSIS — K761 Chronic passive congestion of liver: Secondary | ICD-10-CM | POA: Diagnosis present

## 2017-07-12 DIAGNOSIS — F0390 Unspecified dementia without behavioral disturbance: Secondary | ICD-10-CM | POA: Diagnosis present

## 2017-07-12 DIAGNOSIS — I469 Cardiac arrest, cause unspecified: Secondary | ICD-10-CM | POA: Diagnosis not present

## 2017-07-12 DIAGNOSIS — Z888 Allergy status to other drugs, medicaments and biological substances status: Secondary | ICD-10-CM

## 2017-07-12 DIAGNOSIS — R64 Cachexia: Secondary | ICD-10-CM | POA: Diagnosis present

## 2017-07-12 DIAGNOSIS — Z8249 Family history of ischemic heart disease and other diseases of the circulatory system: Secondary | ICD-10-CM

## 2017-07-12 DIAGNOSIS — I248 Other forms of acute ischemic heart disease: Secondary | ICD-10-CM | POA: Diagnosis present

## 2017-07-12 DIAGNOSIS — Z825 Family history of asthma and other chronic lower respiratory diseases: Secondary | ICD-10-CM

## 2017-07-12 DIAGNOSIS — I4891 Unspecified atrial fibrillation: Secondary | ICD-10-CM | POA: Diagnosis present

## 2017-07-12 DIAGNOSIS — M349 Systemic sclerosis, unspecified: Secondary | ICD-10-CM | POA: Diagnosis present

## 2017-07-12 DIAGNOSIS — Z808 Family history of malignant neoplasm of other organs or systems: Secondary | ICD-10-CM

## 2017-07-12 DIAGNOSIS — Z823 Family history of stroke: Secondary | ICD-10-CM

## 2017-07-12 DIAGNOSIS — I959 Hypotension, unspecified: Secondary | ICD-10-CM | POA: Diagnosis not present

## 2017-07-12 DIAGNOSIS — I5020 Unspecified systolic (congestive) heart failure: Secondary | ICD-10-CM | POA: Diagnosis not present

## 2017-07-12 DIAGNOSIS — Z7901 Long term (current) use of anticoagulants: Secondary | ICD-10-CM

## 2017-07-12 DIAGNOSIS — J90 Pleural effusion, not elsewhere classified: Secondary | ICD-10-CM | POA: Diagnosis not present

## 2017-07-12 DIAGNOSIS — D649 Anemia, unspecified: Secondary | ICD-10-CM | POA: Diagnosis present

## 2017-07-12 DIAGNOSIS — I2510 Atherosclerotic heart disease of native coronary artery without angina pectoris: Secondary | ICD-10-CM | POA: Diagnosis present

## 2017-07-12 DIAGNOSIS — R1312 Dysphagia, oropharyngeal phase: Secondary | ICD-10-CM | POA: Diagnosis present

## 2017-07-12 DIAGNOSIS — Z7952 Long term (current) use of systemic steroids: Secondary | ICD-10-CM

## 2017-07-12 DIAGNOSIS — I5043 Acute on chronic combined systolic (congestive) and diastolic (congestive) heart failure: Secondary | ICD-10-CM | POA: Diagnosis present

## 2017-07-12 DIAGNOSIS — Z515 Encounter for palliative care: Secondary | ICD-10-CM

## 2017-07-12 DIAGNOSIS — I513 Intracardiac thrombosis, not elsewhere classified: Secondary | ICD-10-CM | POA: Diagnosis present

## 2017-07-12 DIAGNOSIS — J9601 Acute respiratory failure with hypoxia: Secondary | ICD-10-CM | POA: Diagnosis present

## 2017-07-12 DIAGNOSIS — K224 Dyskinesia of esophagus: Secondary | ICD-10-CM | POA: Diagnosis present

## 2017-07-12 DIAGNOSIS — B941 Sequelae of viral encephalitis: Secondary | ICD-10-CM

## 2017-07-12 DIAGNOSIS — M341 CR(E)ST syndrome: Secondary | ICD-10-CM | POA: Diagnosis present

## 2017-07-12 DIAGNOSIS — N171 Acute kidney failure with acute cortical necrosis: Secondary | ICD-10-CM

## 2017-07-12 DIAGNOSIS — Z66 Do not resuscitate: Secondary | ICD-10-CM | POA: Diagnosis not present

## 2017-07-12 DIAGNOSIS — K222 Esophageal obstruction: Secondary | ICD-10-CM | POA: Diagnosis present

## 2017-07-12 DIAGNOSIS — R29818 Other symptoms and signs involving the nervous system: Secondary | ICD-10-CM | POA: Diagnosis not present

## 2017-07-12 DIAGNOSIS — Z800 Family history of malignant neoplasm of digestive organs: Secondary | ICD-10-CM

## 2017-07-12 DIAGNOSIS — Z79899 Other long term (current) drug therapy: Secondary | ICD-10-CM

## 2017-07-12 DIAGNOSIS — Z6821 Body mass index (BMI) 21.0-21.9, adult: Secondary | ICD-10-CM

## 2017-07-12 DIAGNOSIS — J9600 Acute respiratory failure, unspecified whether with hypoxia or hypercapnia: Secondary | ICD-10-CM | POA: Diagnosis present

## 2017-07-12 DIAGNOSIS — I110 Hypertensive heart disease with heart failure: Principal | ICD-10-CM | POA: Diagnosis present

## 2017-07-12 DIAGNOSIS — G40909 Epilepsy, unspecified, not intractable, without status epilepticus: Secondary | ICD-10-CM

## 2017-07-12 DIAGNOSIS — M450 Ankylosing spondylitis of multiple sites in spine: Secondary | ICD-10-CM | POA: Diagnosis not present

## 2017-07-12 DIAGNOSIS — K3184 Gastroparesis: Secondary | ICD-10-CM | POA: Diagnosis present

## 2017-07-12 DIAGNOSIS — Z7982 Long term (current) use of aspirin: Secondary | ICD-10-CM

## 2017-07-12 DIAGNOSIS — I5023 Acute on chronic systolic (congestive) heart failure: Secondary | ICD-10-CM | POA: Diagnosis not present

## 2017-07-12 DIAGNOSIS — M459 Ankylosing spondylitis of unspecified sites in spine: Secondary | ICD-10-CM | POA: Diagnosis present

## 2017-07-12 DIAGNOSIS — G40109 Localization-related (focal) (partial) symptomatic epilepsy and epileptic syndromes with simple partial seizures, not intractable, without status epilepticus: Secondary | ICD-10-CM

## 2017-07-12 LAB — COMPREHENSIVE METABOLIC PANEL
ALK PHOS: 143 U/L — AB (ref 38–126)
ALT: 121 U/L — ABNORMAL HIGH (ref 17–63)
ANION GAP: 18 — AB (ref 5–15)
AST: 148 U/L — ABNORMAL HIGH (ref 15–41)
Albumin: 3.6 g/dL (ref 3.5–5.0)
BUN: 37 mg/dL — ABNORMAL HIGH (ref 6–20)
CALCIUM: 9.5 mg/dL (ref 8.9–10.3)
CO2: 19 mmol/L — AB (ref 22–32)
Chloride: 104 mmol/L (ref 101–111)
Creatinine, Ser: 1.72 mg/dL — ABNORMAL HIGH (ref 0.61–1.24)
GFR, EST AFRICAN AMERICAN: 45 mL/min — AB (ref >60)
GFR, EST NON AFRICAN AMERICAN: 39 mL/min — AB (ref >60)
Glucose, Bld: 151 mg/dL — ABNORMAL HIGH (ref 65–99)
Potassium: 4.4 mmol/L (ref 3.5–5.1)
SODIUM: 141 mmol/L (ref 135–145)
TOTAL PROTEIN: 7.8 g/dL (ref 6.5–8.1)
Total Bilirubin: 0.7 mg/dL (ref 0.3–1.2)

## 2017-07-12 LAB — CBC WITH DIFFERENTIAL/PLATELET
BASOS ABS: 0 10*3/uL (ref 0.0–0.1)
BASOS PCT: 0 %
EOS ABS: 0 10*3/uL (ref 0.0–0.7)
Eosinophils Relative: 0 %
HCT: 38.3 % — ABNORMAL LOW (ref 39.0–52.0)
HEMOGLOBIN: 11.6 g/dL — AB (ref 13.0–17.0)
Lymphocytes Relative: 14 %
Lymphs Abs: 1.3 10*3/uL (ref 0.7–4.0)
MCH: 25.3 pg — AB (ref 26.0–34.0)
MCHC: 30.3 g/dL (ref 30.0–36.0)
MCV: 83.6 fL (ref 78.0–100.0)
MONOS PCT: 10 %
Monocytes Absolute: 0.9 10*3/uL (ref 0.1–1.0)
NEUTROS ABS: 7.1 10*3/uL (ref 1.7–7.7)
NEUTROS PCT: 76 %
Platelets: 273 10*3/uL (ref 150–400)
RBC: 4.58 MIL/uL (ref 4.22–5.81)
RDW: 16.3 % — ABNORMAL HIGH (ref 11.5–15.5)
WBC: 9.4 10*3/uL (ref 4.0–10.5)

## 2017-07-12 MED ORDER — FUROSEMIDE 10 MG/ML IJ SOLN
80.0000 mg | Freq: Once | INTRAMUSCULAR | Status: AC
  Administered 2017-07-12: 80 mg via INTRAVENOUS
  Filled 2017-07-12: qty 8

## 2017-07-12 NOTE — ED Triage Notes (Signed)
Pt reports shortness of breath worsening over the last several days, difficulty swallowing. Wife reports respiratory rate 48 at some point tonight?? Fingers blue - pt states they're always that blue. Intermittent shob where posturing and taking breaths every word during speaking necessary.

## 2017-07-12 NOTE — ED Notes (Signed)
Sent label to main lab to add on trop and BNP

## 2017-07-12 NOTE — Telephone Encounter (Signed)
Pt's wife called. Mr Ibe has had episodic SOB. She says he is breathing "48 times a minute". Earlier he got fatigued after going into the bathroom that he loss control of his bladder. I suggested they go to he ED.  Kerin Ransom PA-C 06/21/2017 7:14 PM

## 2017-07-12 NOTE — ED Provider Notes (Signed)
[facility] DEPT Provider Note   CSN: [id number] Arrival date & time: 07/11/2017  2039     History   Chief Complaint Chief Complaint  Patient presents with  . Shortness of Breath    HPI Robert Carey is a 71 y.o. male.  Patient is a 71 year old male who presents with shortness of breath. Per his wife he was recently admitted for aspiration pneumonia. He was found to have dysmotility issues of his esophagus. He is scheduled to have further evaluation this next Friday. In the interim, he has also been diagnosed with congestive heart failure, having a recent echocardiogram showing an EF of 20-25% with diffuse coronary artery disease that was not amenable to stenting or bypass surgery. He was started on Lasix. His lower extremity edema has improved over the last 2 days he's had some increased shortness of breath and difficulty swallowing his pills. He's had a lot of gagging. His wife also noted some slurred speech that started today. He denies any numbness or weakness to his extremities. No fevers. No vomiting other than the gagging when he strained to swallow pills.      Past Medical History:  Diagnosis Date  . Anxiety   . Arthritis   . AS (ankylosing spondylitis) (HCC)   . Aspiration pneumonia ([place]) 05/2017  . Cataract   . CHF (congestive heart failure) ([place])   . CREST syndrome ([place])   . Dementia    memory loss   . Depression   . Diverticulosis of colon (without mention of hemorrhage)   . Dyspnea    with exertion   . Esophageal reflux   . Gastroparesis   . Hiatal hernia   . Mixed connective tissue disease ([place])   . Motility disorder, esophageal   . Peripheral artery disease ([place])   . Raynauds phenomenon   . Scleroderma ([place])   . Seizure disorder ([place])   . Seizures ([place])    07/2016  . Skin ulcers of foot, bilateral ([place]) 01/03/2017   R foot only  . Spondylitis, ankylosing ([place])   . Stricture and stenosis of esophagus     Patient Active Problem List   Diagnosis Date Noted  . Acute respiratory failure with hypoxia ([place]) 07/13/2017  . Cardiomyopathy ([place]) 06/28/2017  . Dysphagia   . Aspiration pneumonia ([place]) 06/06/2017  . Peripheral arterial disease ([place]) 03/26/2017  . Presbycusis of both ears 05/31/2016  . Seizure disorder ([place]) 01/10/2016  . Cervical pain 11/18/2012  . Ankylosing spondylitis ([place]) 11/18/2012  . Memory impairment 10/29/2012  . 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 GERD 03/08/2009  . DEMENTIA 03/07/2009  . RAYNAUD'S SYNDROME 03/07/2009  . ESOPHAGEAL MOTILITY DISORDER 03/07/2009  . GASTROPARESIS 03/07/2009  . HIATAL HERNIA 03/07/2009  . DIVERTICULOSIS, COLON 03/07/2009  . SCLERODERMA 03/07/2009  . CREST SYNDROME 03/07/2009  . Raynaud's phenomenon 03/07/2009    Past Surgical History:  Procedure Laterality Date  . CORONARY ANGIOGRAPHY N/A 06/28/2017   Procedure: CORONARY ANGIOGRAPHY;  Surgeon: Nelva Bush, MD;  Location: [facility];  Service: Cardiovascular;  Laterality: N/A;  . ESOPHAGOGASTRODUODENOSCOPY (EGD) WITH PROPOFOL N/A 06/07/2017   Procedure: ESOPHAGOGASTRODUODENOSCOPY (EGD) WITH PROPOFOL;  Surgeon: Manus Gunning, MD;  Location: [facility];  Service: Gastroenterology;  Laterality: N/A;  . IR GENERIC HISTORICAL  02/07/2017   IR TIB-PERO ART UNI PTA EA ADD VESSEL MOD SED 02/07/2017 Corrie Mckusick, DO [facility]  . IR GENERIC HISTORICAL  02/07/2017   IR US GUIDE VASC ACCESS LEFT 02/07/2017 Corrie Mckusick, DO [facility]  .  IR GENERIC HISTORICAL  02/07/2017   IR TIB-PERO ART PTA MOD SED 02/07/2017 Corrie Mckusick, DO [facility]  . IR GENERIC HISTORICAL  02/07/2017   IR ANGIOGRAM SELECTIVE EACH ADDITIONAL VESSEL 02/07/2017 Corrie Mckusick, DO [facility]  . IR GENERIC HISTORICAL  02/07/2017   IR ANGIOGRAM EXTREMITY RIGHT 02/07/2017 Corrie Mckusick, DO [facility]  . IR GENERIC HISTORICAL  02/07/2017   IR THROMBECT PRIM MECH INIT (INCLU) MOD SED 02/07/2017 Corrie Mckusick, DO [facility]  . IR  RADIOLOGIST EVAL & MGMT  02/27/2017  . IR RADIOLOGIST EVAL & MGMT  01/22/2017  . IR THORACENTESIS ASP PLEURAL SPACE W/IMG GUIDE  06/07/2017  . NOSE SURGERY  2005  . RIGHT HEART CATH N/A 06/28/2017   Procedure: RIGHT HEART CATH;  Surgeon: Nelva Bush, MD;  Location: [facility];  Service: Cardiovascular;  Laterality: N/A;  . SAVORY DILATION N/A 06/07/2017   Procedure: SAVORY DILATION;  Surgeon: Manus Gunning, MD;  Location: [facility] ENDOSCOPY;  Service: Gastroenterology;  Laterality: N/A;  . THUMB ARTHROSCOPY Left   . TOE SURGERY     right great toe  . WISDOM TOOTH EXTRACTION         Home Medications    Prior to Admission medications   Medication Sig Start Date End Date Taking? Authorizing Provider  acetaminophen (TYLENOL) 650 MG CR tablet Take 1,300 mg by mouth 2 (two) times daily as needed for pain.   Yes [provider]  apixaban (ELIQUIS) 5 MG TABS tablet Take 1 tablet (5 mg total) by mouth 2 (two) times daily. 07/02/17  Yes Lendon Colonel, NP  aspirin EC 81 MG tablet Take 81 mg by mouth daily.   Yes [provider]  carvedilol (COREG) 3.125 MG tablet Take 1 tablet (3.125 mg total) by mouth 2 (two) times daily. 06/28/17 06/28/18 Yes End, Harrell Gave, MD  clobetasol cream (TEMOVATE) 2.53 % Apply 1 application topically daily as needed (eczema (winter months)).    Yes [provider]  furosemide (LASIX) 20 MG tablet Take 20 mg by mouth daily.  06/21/17  Yes [provider]  HYDROcodone-homatropine (HYDROMET) 5-1.5 MG/5ML syrup Take 5 mLs by mouth every 4 (four) hours as needed for cough.   Yes [provider]  hydroxychloroquine (PLAQUENIL) 200 MG tablet Take 200 mg by mouth 2 (two) times daily with a meal.  04/30/17  Yes [provider]  ketoconazole (NIZORAL) 2 % cream Apply 1 application topically 2 (two) times daily as needed (toe nail fungus).    Yes [provider]  levETIRAcetam (KEPPRA) 250 MG tablet Take  500 mg by mouth every evening. 1900   Yes [provider]  levETIRAcetam (KEPPRA) 750 MG tablet Take 375-1,500 mg by mouth See admin instructions. Take 2 tablets (1500 mg) in the morning, 0.5 tablet (375 mg) at noon, & 1 tablet (750 mg) at 2100.   Yes [provider]  NIFEdipine (PROCARDIA XL/ADALAT-CC) 60 MG 24 hr tablet Take 60 mg by mouth at bedtime.    Yes [provider]  omeprazole (PRILOSEC) 40 MG capsule TAKE (1) CAPSULE BY MOUTH ONCE DAILY. Patient taking differently: TAKE 1 CAPSULE (40 MG) ONCE DAILY BEFORE BREAKFAST 05/27/17  Yes Irene Shipper, MD  OXcarbazepine (TRILEPTAL) 150 MG tablet Take 300-450 mg by mouth See admin instructions. Take 2 tablets (300 mg) by mouth every morning (9am) and 3 tablets (450 mg) at bedtime (9pm)   Yes [provider]  predniSONE (DELTASONE) 5 MG tablet Take 5  mg by mouth daily. (0900)   Yes [provider]  PRESCRIPTION MEDICATION Apply 1 application topically See admin instructions. Pain cream compounded at [facility] - meloxicam/ topiramate/ lidocaine/prilocaine - apply topically once every morning, may also use up to three times daily as needed for neck pain   Yes [provider]  benzonatate (TESSALON PERLES) 100 MG capsule Take 1 capsule (100 mg total) by mouth 3 (three) times daily. Patient not taking: Reported on 06/23/2017 07/02/17   Lendon Colonel, NP  Na Sulfate-K Sulfate-Mg Sulf 17.5-3.13-1.6 GM/180ML SOLN Suprep as directed.  No substitutions. 03/11/17   Irene Shipper, MD    Family History Family History  Problem Relation Age of Onset  . Breast cancer Mother   . Pancreatic cancer Mother   . Diabetes Mother   . Heart disease Mother   . Heart disease Father   . COPD Father   . Melanoma Sister   . Stroke Brother   . Colon cancer Neg Hx     Social History Social History  Substance Use Topics  . Smoking status: Never Smoker  . Smokeless tobacco: Never Used  . Alcohol use No      Allergies   Dilantin [phenytoin sodium extended]   Review of Systems Review of Systems  Constitutional: Positive for fatigue. Negative for chills, diaphoresis and fever.  HENT: Negative for congestion, rhinorrhea and sneezing.   Eyes: Negative.   Respiratory: Positive for cough and shortness of breath. Negative for chest tightness.   Cardiovascular: Positive for leg swelling. Negative for chest pain.  Gastrointestinal: Negative for abdominal pain, blood in stool, diarrhea, nausea and vomiting.  Genitourinary: Negative for difficulty urinating, flank pain, frequency and hematuria.  Musculoskeletal: Negative for arthralgias and back pain.  Skin: Negative for rash.  Neurological: Positive for speech difficulty. Negative for dizziness, weakness, numbness and headaches.     Physical Exam Updated Vital Signs BP 109/70 Comment: MD aware of RR  Pulse 87 Comment: MD aware of RR  Resp (!) 36 Comment: MD aware of RR  SpO2 (!) 87% Comment: MD aware of RR  Physical Exam  Constitutional: He is oriented to person, place, and time. He appears well-developed and well-nourished.  HENT:  Head: Normocephalic and atraumatic.  Eyes: Pupils are equal, round, and reactive to light.  Neck: Normal range of motion. Neck supple.  Cardiovascular: Normal rate, regular rhythm and normal heart sounds.   Pulmonary/Chest: No respiratory distress. He has no wheezes. He has rales. He exhibits no tenderness.  Patient has an abnormal breathing pattern where he almost has a Kussmaul type respiratory effort with respirations that would be in the 40s to 50s per minute and then he has it. Apnea for about 10-15 seconds.  Abdominal: Soft. Bowel sounds are normal. There is no tenderness. There is no rebound and no guarding.  Musculoskeletal: Normal range of motion. He exhibits edema (2+ pain edema bilaterally).  Lymphadenopathy:    He has no cervical adenopathy.  Neurological: He is alert and oriented to person,  place, and time.  Skin: Skin is warm and dry. No rash noted.  Psychiatric: He has a normal mood and affect.     ED Treatments / Results  Labs (all labs ordered are listed, but only abnormal results are displayed) Labs Reviewed  CBC WITH DIFFERENTIAL/PLATELET - Abnormal; Notable for the following:       Result Value   Hemoglobin 11.6 (*)    HCT 38.3 (*)    MCH 25.3 (*)  RDW 16.3 (*)    All other components within normal limits  COMPREHENSIVE METABOLIC PANEL - Abnormal; Notable for the following:    CO2 19 (*)    Glucose, Bld 151 (*)    BUN 37 (*)    Creatinine, Ser 1.72 (*)    AST 148 (*)    ALT 121 (*)    Alkaline Phosphatase 143 (*)    GFR calc non Af Amer 39 (*)    GFR calc Af Amer 45 (*)    Anion gap 18 (*)    All other components within normal limits  BRAIN NATRIURETIC PEPTIDE  TROPONIN I  I-STAT TROPONIN, ED    EKG  EKG Interpretation  Date/Time:  Friday July 12 2017 20:51:39 EDT Ventricular Rate:  92 PR Interval:  202 QRS Duration: 110 QT Interval:  430 QTC Calculation: 531 R Axis:   -29 Text Interpretation:  Normal sinus rhythm Anterior infarct , age undetermined Abnormal ECG since last tracing no significant change Confirmed by Malvin Johns [phone]) on 06/29/2017 9:32:17 PM       Radiology Dg Chest 2 View  Result Date: 06/25/2017 CLINICAL DATA:  Worsening shortness of breath over the last several days. EXAM: CHEST  2 VIEW COMPARISON:  Radiograph 07/02/2017 FINDINGS: Unchanged cardiomegaly. There is atherosclerosis of the aortic arch. Small to moderate bilateral pleural effusions, increased from prior exam. Vascular congestion with probable [place] B-lines indicating early pulmonary edema. No pneumothorax. Chronic change about the right shoulder. IMPRESSION: 1. Increased bilateral pleural effusions, vascular congestion or early pulmonary edema consistent with CHF. 2. Stable cardiomegaly and atherosclerosis of the aortic arch. Electronically Signed   By:  Jeb Levering M.D.   On: 06/23/2017 21:40   Ct Head Wo Contrast  Result Date: 07/06/2017 CLINICAL DATA:  Neuro deficit, subacute.  Fatigue. EXAM: CT HEAD WITHOUT CONTRAST TECHNIQUE: Contiguous axial images were obtained from the base of the skull through the vertex without intravenous contrast. COMPARISON:  Head CT 11/03/2014 FINDINGS: Brain: Stable degree of atrophy and chronic small vessel ischemia. Remote lacunar infarct versus prominent perivascular space in the left basal ganglia. No intracranial hemorrhage, mass effect, or midline shift. No hydrocephalus. The basilar cisterns are patent. No evidence of territorial infarct or acute ischemia. No extra-axial or intracranial fluid collection. Vascular: Atherosclerosis of skullbase vasculature without hyperdense vessel or abnormal calcification. Skull: No skull fracture or focal lesion. Sinuses/Orbits: Paranasal sinuses and mastoid air cells are clear. The visualized orbits are unremarkable. Other: None. IMPRESSION: No acute intracranial abnormality. Stable atrophy and chronic small vessel ischemia. Electronically Signed   By: Jeb Levering M.D.   On: 07/05/2017 23:18    Procedures Procedures (including critical care time)  Medications Ordered in ED Medications  levETIRAcetam (KEPPRA) 500 mg in sodium chloride 0.9 % 100 mL IVPB (not administered)  furosemide (LASIX) injection 80 mg (80 mg Intravenous Given 07/08/2017 2208)     Initial Impression / Assessment and Plan / ED Course  I have reviewed the triage vital signs and the nursing notes.  Pertinent labs & imaging results that were available during my care of the patient were reviewed by me and considered in my medical decision making (see chart for details).    Patient is a 71 year old male who presents with shortness of breath. He has evidence of congestive heart failure with fluid overload. He was given Lasix. He was given a trial of BiPAP however he is not tolerating this well. He  was transitioned to a nonrebreather mask. He is  awake and oriented. He's maintaining his airway. His troponin is negative. There is no evidence of ischemia on EKG. I spoke with Dr. Hal Hope who will admit the patient.  CRITICAL CARE Performed by: Elayna Tobler Total critical care time: 45 minutes Critical care time was exclusive of separately billable procedures and treating other patients. Critical care was necessary to treat or prevent imminent or life-threatening deterioration. Critical care was time spent personally by me on the following activities: development of treatment plan with patient and/or surrogate as well as nursing, discussions with consultants, evaluation of patient's response to treatment, examination of patient, obtaining history from patient or surrogate, ordering and performing treatments and interventions, ordering and review of laboratory studies, ordering and review of radiographic studies, pulse oximetry and re-evaluation of patient's condition.    Final Clinical Impressions(s) / ED Diagnoses   Final diagnoses:  Systolic congestive heart failure, unspecified HF chronicity ([place])  Acute respiratory failure, unspecified whether with hypoxia or hypercapnia [facility])    New Prescriptions New Prescriptions   No medications on file     Malvin Johns, MD 07/13/17 [phone]

## 2017-07-12 NOTE — ED Notes (Signed)
Patient transported to X-ray 

## 2017-07-12 NOTE — ED Notes (Signed)
Patient transported to CT 

## 2017-07-12 NOTE — ED Notes (Signed)
Non rebreather placed on pt as O2 sats noted to be 75% on 4L O2.  Dr. Tamera Punt aware and bipap ordered.  RT paged to bedside.

## 2017-07-13 ENCOUNTER — Encounter (HOSPITAL_COMMUNITY): Payer: Self-pay | Admitting: Internal Medicine

## 2017-07-13 ENCOUNTER — Inpatient Hospital Stay (HOSPITAL_COMMUNITY): Payer: PPO

## 2017-07-13 DIAGNOSIS — F0390 Unspecified dementia without behavioral disturbance: Secondary | ICD-10-CM | POA: Diagnosis present

## 2017-07-13 DIAGNOSIS — Z66 Do not resuscitate: Secondary | ICD-10-CM | POA: Diagnosis not present

## 2017-07-13 DIAGNOSIS — K222 Esophageal obstruction: Secondary | ICD-10-CM | POA: Diagnosis present

## 2017-07-13 DIAGNOSIS — R64 Cachexia: Secondary | ICD-10-CM | POA: Diagnosis present

## 2017-07-13 DIAGNOSIS — M349 Systemic sclerosis, unspecified: Secondary | ICD-10-CM

## 2017-07-13 DIAGNOSIS — I4891 Unspecified atrial fibrillation: Secondary | ICD-10-CM | POA: Diagnosis present

## 2017-07-13 DIAGNOSIS — M459 Ankylosing spondylitis of unspecified sites in spine: Secondary | ICD-10-CM | POA: Diagnosis present

## 2017-07-13 DIAGNOSIS — I5023 Acute on chronic systolic (congestive) heart failure: Secondary | ICD-10-CM

## 2017-07-13 DIAGNOSIS — D649 Anemia, unspecified: Secondary | ICD-10-CM | POA: Diagnosis present

## 2017-07-13 DIAGNOSIS — J9600 Acute respiratory failure, unspecified whether with hypoxia or hypercapnia: Secondary | ICD-10-CM | POA: Diagnosis present

## 2017-07-13 DIAGNOSIS — N171 Acute kidney failure with acute cortical necrosis: Secondary | ICD-10-CM | POA: Diagnosis present

## 2017-07-13 DIAGNOSIS — M341 CR(E)ST syndrome: Secondary | ICD-10-CM | POA: Diagnosis present

## 2017-07-13 DIAGNOSIS — Z515 Encounter for palliative care: Secondary | ICD-10-CM | POA: Diagnosis not present

## 2017-07-13 DIAGNOSIS — I248 Other forms of acute ischemic heart disease: Secondary | ICD-10-CM | POA: Diagnosis present

## 2017-07-13 DIAGNOSIS — I110 Hypertensive heart disease with heart failure: Secondary | ICD-10-CM | POA: Diagnosis present

## 2017-07-13 DIAGNOSIS — K3184 Gastroparesis: Secondary | ICD-10-CM | POA: Diagnosis present

## 2017-07-13 DIAGNOSIS — G40409 Other generalized epilepsy and epileptic syndromes, not intractable, without status epilepticus: Secondary | ICD-10-CM | POA: Diagnosis present

## 2017-07-13 DIAGNOSIS — B941 Sequelae of viral encephalitis: Secondary | ICD-10-CM | POA: Diagnosis not present

## 2017-07-13 DIAGNOSIS — J9601 Acute respiratory failure with hypoxia: Secondary | ICD-10-CM | POA: Diagnosis present

## 2017-07-13 DIAGNOSIS — I959 Hypotension, unspecified: Secondary | ICD-10-CM | POA: Diagnosis not present

## 2017-07-13 DIAGNOSIS — I513 Intracardiac thrombosis, not elsewhere classified: Secondary | ICD-10-CM | POA: Diagnosis present

## 2017-07-13 DIAGNOSIS — G40909 Epilepsy, unspecified, not intractable, without status epilepticus: Secondary | ICD-10-CM

## 2017-07-13 DIAGNOSIS — K224 Dyskinesia of esophagus: Secondary | ICD-10-CM | POA: Diagnosis present

## 2017-07-13 DIAGNOSIS — I5043 Acute on chronic combined systolic (congestive) and diastolic (congestive) heart failure: Secondary | ICD-10-CM | POA: Diagnosis present

## 2017-07-13 DIAGNOSIS — M450 Ankylosing spondylitis of multiple sites in spine: Secondary | ICD-10-CM

## 2017-07-13 DIAGNOSIS — R1312 Dysphagia, oropharyngeal phase: Secondary | ICD-10-CM | POA: Diagnosis present

## 2017-07-13 DIAGNOSIS — I255 Ischemic cardiomyopathy: Secondary | ICD-10-CM | POA: Diagnosis present

## 2017-07-13 DIAGNOSIS — I469 Cardiac arrest, cause unspecified: Secondary | ICD-10-CM | POA: Diagnosis not present

## 2017-07-13 DIAGNOSIS — I2510 Atherosclerotic heart disease of native coronary artery without angina pectoris: Secondary | ICD-10-CM | POA: Diagnosis present

## 2017-07-13 LAB — BLOOD GAS, ARTERIAL
Acid-base deficit: 6 mmol/L — ABNORMAL HIGH (ref 0.0–2.0)
Bicarbonate: 17.3 mmol/L — ABNORMAL LOW (ref 20.0–28.0)
DRAWN BY: 51133
Delivery systems: POSITIVE
Expiratory PAP: 6
FIO2: 30
INSPIRATORY PAP: 12
O2 SAT: 97.7 %
PCO2 ART: 25.7 mmHg — AB (ref 32.0–48.0)
PO2 ART: 123 mmHg — AB (ref 83.0–108.0)
Patient temperature: 98.6
pH, Arterial: 7.443 (ref 7.350–7.450)

## 2017-07-13 LAB — TROPONIN I: TROPONIN I: 0.09 ng/mL — AB (ref <0.03)

## 2017-07-13 LAB — GLUCOSE, CAPILLARY
GLUCOSE-CAPILLARY: 53 mg/dL — AB (ref 65–99)
GLUCOSE-CAPILLARY: 122 mg/dL — AB (ref 65–99)
Glucose-Capillary: 84 mg/dL (ref 65–99)
Glucose-Capillary: 82 mg/dL (ref 65–99)
Glucose-Capillary: 57 mg/dL — ABNORMAL LOW (ref 65–99)

## 2017-07-13 LAB — CBC
HCT: 39.3 % (ref 39.0–52.0)
Hemoglobin: 12.5 g/dL — ABNORMAL LOW (ref 13.0–17.0)
MCH: 26.3 pg (ref 26.0–34.0)
MCHC: 31.8 g/dL (ref 30.0–36.0)
MCV: 82.6 fL (ref 78.0–100.0)
PLATELETS: 206 10*3/uL (ref 150–400)
RBC: 4.76 MIL/uL (ref 4.22–5.81)
RDW: 16.1 % — AB (ref 11.5–15.5)
WBC: 10.3 10*3/uL (ref 4.0–10.5)

## 2017-07-13 LAB — MAGNESIUM: Magnesium: 2.2 mg/dL (ref 1.7–2.4)

## 2017-07-13 LAB — HEPARIN LEVEL (UNFRACTIONATED)

## 2017-07-13 LAB — APTT
APTT: 88 s — AB (ref 24–36)
aPTT: 63 seconds — ABNORMAL HIGH (ref 24–36)

## 2017-07-13 LAB — BRAIN NATRIURETIC PEPTIDE: B Natriuretic Peptide: >4500 pg/mL — ABNORMAL HIGH (ref 0.0–100.0)

## 2017-07-13 LAB — I-STAT TROPONIN, ED: TROPONIN I, POC: 0.03 ng/mL (ref 0.00–0.08)

## 2017-07-13 LAB — MRSA PCR SCREENING: MRSA BY PCR: NEGATIVE

## 2017-07-13 MED ORDER — DEXTROSE 50 % IV SOLN
INTRAVENOUS | Status: AC
  Administered 2017-07-13: 50 mL
  Filled 2017-07-13: qty 50

## 2017-07-13 MED ORDER — CHLORHEXIDINE GLUCONATE 0.12 % MT SOLN
15.0000 mL | Freq: Two times a day (BID) | OROMUCOSAL | Status: DC
  Administered 2017-07-13: 15 mL via OROMUCOSAL
  Filled 2017-07-13 (×2): qty 15

## 2017-07-13 MED ORDER — DEXTROSE 50 % IV SOLN
50.0000 mL | Freq: Once | INTRAVENOUS | Status: AC
  Administered 2017-07-13: 25 mL via INTRAVENOUS

## 2017-07-13 MED ORDER — FUROSEMIDE 10 MG/ML IJ SOLN
80.0000 mg | Freq: Two times a day (BID) | INTRAMUSCULAR | Status: DC
  Administered 2017-07-13: 80 mg via INTRAVENOUS
  Filled 2017-07-13: qty 8

## 2017-07-13 MED ORDER — ONDANSETRON HCL 4 MG/2ML IJ SOLN
4.0000 mg | Freq: Four times a day (QID) | INTRAMUSCULAR | Status: DC | PRN
  Administered 2017-07-14: 4 mg via INTRAVENOUS
  Filled 2017-07-13: qty 2

## 2017-07-13 MED ORDER — SODIUM CHLORIDE 0.9 % IV SOLN
1500.0000 mg | INTRAVENOUS | Status: DC
  Administered 2017-07-13: 1500 mg via INTRAVENOUS
  Filled 2017-07-13 (×2): qty 15

## 2017-07-13 MED ORDER — ACETAMINOPHEN 325 MG PO TABS
650.0000 mg | ORAL_TABLET | Freq: Four times a day (QID) | ORAL | Status: DC | PRN
  Filled 2017-07-13: qty 2

## 2017-07-13 MED ORDER — SODIUM CHLORIDE 0.9 % IV SOLN
200.0000 mg | Freq: Once | INTRAVENOUS | Status: AC
  Administered 2017-07-13: 200 mg via INTRAVENOUS
  Filled 2017-07-13: qty 20

## 2017-07-13 MED ORDER — ONDANSETRON HCL 4 MG PO TABS: 4.0000 mg | ORAL_TABLET | Freq: Four times a day (QID) | ORAL | Status: DC | PRN

## 2017-07-13 MED ORDER — SODIUM CHLORIDE 0.9 % IV SOLN
375.0000 mg | INTRAVENOUS | Status: DC
  Administered 2017-07-13: 380 mg via INTRAVENOUS
  Filled 2017-07-13 (×2): qty 3.8

## 2017-07-13 MED ORDER — DEXTROSE 50 % IV SOLN
INTRAVENOUS | Status: AC
  Administered 2017-07-13: 25 mL via INTRAVENOUS
  Filled 2017-07-13: qty 50

## 2017-07-13 MED ORDER — ORAL CARE MOUTH RINSE
15.0000 mL | Freq: Two times a day (BID) | OROMUCOSAL | Status: DC
  Administered 2017-07-13: 15 mL via OROMUCOSAL

## 2017-07-13 MED ORDER — LEVETIRACETAM 500 MG/5ML IV SOLN
500.0000 mg | INTRAVENOUS | Status: DC
  Administered 2017-07-13: 500 mg via INTRAVENOUS
  Filled 2017-07-13: qty 5

## 2017-07-13 MED ORDER — HEPARIN (PORCINE) IN NACL 100-0.45 UNIT/ML-% IJ SOLN
1100.0000 [IU]/h | INTRAMUSCULAR | Status: DC
  Administered 2017-07-13: 1000 [IU]/h via INTRAVENOUS
  Administered 2017-07-14: 1100 [IU]/h via INTRAVENOUS
  Filled 2017-07-13 (×2): qty 250

## 2017-07-13 MED ORDER — METHYLPREDNISOLONE SODIUM SUCC 40 MG IJ SOLR
40.0000 mg | Freq: Every day | INTRAMUSCULAR | Status: DC
  Administered 2017-07-13: 40 mg via INTRAVENOUS
  Filled 2017-07-13: qty 1

## 2017-07-13 MED ORDER — PANTOPRAZOLE SODIUM 40 MG IV SOLR
40.0000 mg | INTRAVENOUS | Status: DC
  Administered 2017-07-13: 40 mg via INTRAVENOUS
  Filled 2017-07-13: qty 40

## 2017-07-13 MED ORDER — FUROSEMIDE 10 MG/ML IJ SOLN
80.0000 mg | Freq: Once | INTRAMUSCULAR | Status: AC
  Administered 2017-07-13: 80 mg via INTRAVENOUS
  Filled 2017-07-13: qty 8

## 2017-07-13 MED ORDER — METHYLPREDNISOLONE SODIUM SUCC 40 MG IJ SOLR: 20.0000 mg | Freq: Every day | INTRAMUSCULAR | Status: DC

## 2017-07-13 MED ORDER — SODIUM CHLORIDE 0.9 % IV SOLN
100.0000 mg | Freq: Two times a day (BID) | INTRAVENOUS | Status: DC
  Administered 2017-07-13: 100 mg via INTRAVENOUS
  Filled 2017-07-13 (×4): qty 10

## 2017-07-13 MED ORDER — METHYLPREDNISOLONE SODIUM SUCC 40 MG IJ SOLR
40.0000 mg | Freq: Two times a day (BID) | INTRAMUSCULAR | Status: DC
  Administered 2017-07-13: 40 mg via INTRAVENOUS
  Filled 2017-07-13: qty 1

## 2017-07-13 MED ORDER — SODIUM CHLORIDE 0.9 % IV SOLN
50.0000 mg | Freq: Two times a day (BID) | INTRAVENOUS | Status: DC
  Filled 2017-07-13: qty 5

## 2017-07-13 MED ORDER — FUROSEMIDE 10 MG/ML IJ SOLN: 40.0000 mg | Freq: Two times a day (BID) | INTRAMUSCULAR | Status: DC

## 2017-07-13 MED ORDER — ACETAMINOPHEN 650 MG RE SUPP
650.0000 mg | Freq: Four times a day (QID) | RECTAL | Status: DC | PRN
  Administered 2017-07-13: 650 mg via RECTAL
  Filled 2017-07-13: qty 1

## 2017-07-13 MED ORDER — LEVETIRACETAM 500 MG/5ML IV SOLN
750.0000 mg | INTRAVENOUS | Status: DC
  Administered 2017-07-13: 750 mg via INTRAVENOUS
  Filled 2017-07-13: qty 7.5

## 2017-07-13 MED ORDER — SODIUM CHLORIDE 0.9 % IV SOLN
500.0000 mg | Freq: Once | INTRAVENOUS | Status: AC
  Administered 2017-07-13: 500 mg via INTRAVENOUS
  Filled 2017-07-13: qty 5

## 2017-07-13 NOTE — Plan of Care (Signed)
Problem: Education: Goal: Knowledge of  General Education information/materials will improve Outcome: Completed/Met Date Met: 07/13/17 Patient and wife oriented to room, plan of care, and hospital expectations. Safety measures put in to place. Wife updated on patient current status and current orders. Patient able to discuss pain status at this time.

## 2017-07-13 NOTE — Progress Notes (Signed)
PROGRESS NOTE                                                                                                                                                                                                             Patient Demographics:    Robert Carey, is a 71 y.o. male, DOB - 1946-01-22, ONG:[id number]  Admit date - 07/18/2017   Admitting Physician Rise Patience, MD  Outpatient Primary MD for the patient is Asencion Noble, MD  LOS - 0  Outpatient Specialists: Dr End ( cardiology) Neurologist at [facility] Moros ( GI)   Chief Complaint  Patient presents with  . Shortness of Breath       Brief Narrative   71 year old male with ischemic cardiomyopathy with EF of 20-25% and left ventricular apical thrombus on recent echo ( on eliquis since early this month),   severe diffuse small vessel disease on recent cardiac cath recommended for aggressive medical management, A. fib on Eliquis, seizure disorder, mild dementia, ankylosing spondylitis on prednisone and hydroxychloroquine esophageal dysmotility with oropharyngeal dysphagia (hospitalization one month back with aspiration pneumonia and required EGD with esophageal dilatation) was brought to the ED by his wife for worsening shortness of breath. Patient was having persistent dyspnea since his hospital discharge one month back but progressed rapidly in the past 48 hours. He also noticed increased lower leg swelling. For past few days he also has been having difficulty swallowing both solid and liquid including his pills. He was scheduled for repeat EGD with dilatation with Dr. Havery Moros on 8/31. Patient denied any chest pain, palpitations, fever, chills, nausea, vomiting, abdominal pain, bowel or urinary symptoms. In the ED patient was in acute hypoxic respiratory failure and placed on BiPAP. Chest x-ray consistent with acute pulmonary edema. Head CT was  unremarkable. Patient given 40 mg IV Lasix and admitted to stepdown unit.    Subjective:   Patient on BiPAP. Denies any chest pain, nausea or vomiting. Feels his breathing to be slightly better.  Assessment  & Plan :    Principal Problem:   Acute respiratory failure with hypoxia (HCC) Acute on chronic systolic CHF  Received 80 mg IV Lasix on admission. I will continue him on IV Lasix 80 mg twice a day. Strict I/O and daily weight. Continue BiPAP and wean as tolerated. Recent echo done so repeat  not needed. Hold other home medications since patient is nothing by mouth. Cardiology consulted for further management. (Patient was referred to heart failure clinic and has appointment to see Dr. Aundra Dubin next month).  Active Problems: Esophageal motility disorder Underwent EGD with dilatation one month back. He has appointment for repeat EGD with dilatation on 8/31. Home medications switch to IV and patient nothing by mouth. Can use NG for meds once patient is off BiPAP. I have consulted [facility] for further recommendations.   Scleroderma and ankylosing spondylitis Switch oral prednisone to IV Solu-Medrol (increased dose given glucose blood glucose). Hydroxychloroquine on hold.  Elevated troponin Suspect demand ischemia. Will not cycle troponins further since patient had recent cardiac cath and no plans for further intervention.  Left ventricular apical thrombus Seen on recent 2-D echo. Patient was started on eliquis which is on hold and currently on IV heparin.  Transaminitis Suspect hepatic congestion from acute CHF. Monitor.     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 Switch Keppra to IV. Follows with neurologist at [facility]. I have consulted neurology to evaluate for suitable alternative to Trileptal since patient is nothing by mouth.  ? Cheyne-Stokes breathing MRI brain ordered rule out acute intracranial abnormality.  Discussed goals of care at bedside with patient's wife.  She understands patient has multiple issues going on and long-term prognosis may be poor. She is interested in initiating discussion with palliative care for was of care . Consult placed.    Code Status :DO NOT RESUSCITATE  Family Communication  :  wife at bedside  Disposition Plan  : Continue step down monitoring  Barriers For Discharge : Active symptoms  Consults  :   Cardiology [facility]  palliative care  Procedures  : Head CT  DVT Prophylaxis  : IV heparin  Lab Results  Component Value Date   PLT 273 06/21/2017    Antibiotics  :   Anti-infectives    None        Objective:   Vitals:   07/13/17 0500 07/13/17 0641 07/13/17 0700 07/13/17 0741  BP: 102/74  114/84 114/84  Pulse: 89 87  78  Resp: (!) 25 (!) 34  (!) 24  Temp:   (!) 97.4 F (36.3 C)   TempSrc:   Axillary   SpO2: 97% 99%  100%  Weight:      Height:        Wt Readings from Last 3 Encounters:  07/13/17 68.9 kg (151 lb 14.4 oz)  07/02/17 68.9 kg (152 lb)  06/28/17 69.4 kg (153 lb)     Intake/Output Summary (Last 24 hours) at 07/13/17 0855 Last data filed at 07/13/17 0500  Gross per 24 hour  Intake           126.33 ml  Output              300 ml  Net          -173.67 ml     Physical Exam  Gen.: Elderly male on BiPAP HEENT: Dilated pupils, moist mucosa, supple neck, JVD + Chest: Coarse crackles over left lung base, scattered rhonchi CVS: N S1&S2, no murmurs, rubs or gallop GI: soft, mild distention, nontender, bowel sounds present Musculoskeletal: warm, trace pitting edema     Data Review:    CBC  Recent Labs Lab 06/25/2017 2057  WBC 9.4  HGB 11.6*  HCT 38.3*  PLT 273  MCV 83.6  MCH 25.3*  MCHC 30.3  RDW 16.3*  LYMPHSABS  1.3  MONOABS 0.9  EOSABS 0.0  BASOSABS 0.0    Chemistries   Recent Labs Lab 07/05/2017 2057 07/13/17 0345  NA 141  --   K 4.4  --   CL 104  --   CO2 19*  --   GLUCOSE 151*  --   BUN 37*  --   CREATININE 1.72*  --   CALCIUM 9.5   --   MG  --  2.2  AST 148*  --   ALT 121*  --   ALKPHOS 143*  --   BILITOT 0.7  --    ------------------------------------------------------------------------------------------------------------------ No results for input(s): CHOL, HDL, LDLCALC, TRIG, CHOLHDL, LDLDIRECT in the last 72 hours.  No results found for: HGBA1C ------------------------------------------------------------------------------------------------------------------ No results for input(s): TSH, T4TOTAL, T3FREE, THYROIDAB in the last 72 hours.  Invalid input(s): FREET3 ------------------------------------------------------------------------------------------------------------------ No results for input(s): VITAMINB12, FOLATE, FERRITIN, TIBC, IRON, RETICCTPCT in the last 72 hours.  Coagulation profile No results for input(s): INR, PROTIME in the last 168 hours.  No results for input(s): DDIMER in the last 72 hours.  Cardiac Enzymes  Recent Labs Lab 07/13/17 0345  TROPONINI 0.09*   ------------------------------------------------------------------------------------------------------------------    Component Value Date/Time   BNP >4,500.0 (H) 07/13/2017 2057    Inpatient Medications  Scheduled Meds: . chlorhexidine  15 mL Mouth Rinse BID  . furosemide  80 mg Intravenous Q12H  . furosemide  80 mg Intravenous Once  . mouth rinse  15 mL Mouth Rinse q12n4p  . methylPREDNISolone (SOLU-MEDROL) injection  40 mg Intravenous Daily  . pantoprazole (PROTONIX) IV  40 mg Intravenous Q24H   Continuous Infusions: . heparin 1,000 Units/hr (07/13/17 0322)  . levETIRAcetam    . levETIRAcetam    . levETIRAcetam    . levETIRAcetam     PRN Meds:.acetaminophen **OR** acetaminophen, ondansetron **OR** ondansetron (ZOFRAN) IV  Micro Results No results found for this or any previous visit (from the past 240 hour(s)).  Radiology Reports Dg Chest 2 View  Result Date: 06/29/2017 CLINICAL DATA:  Worsening shortness of  breath over the last several days. EXAM: CHEST  2 VIEW COMPARISON:  Radiograph 07/02/2017 FINDINGS: Unchanged cardiomegaly. There is atherosclerosis of the aortic arch. Small to moderate bilateral pleural effusions, increased from prior exam. Vascular congestion with probable Kerley B-lines indicating early pulmonary edema. No pneumothorax. Chronic change about the right shoulder. IMPRESSION: 1. Increased bilateral pleural effusions, vascular congestion or early pulmonary edema consistent with CHF. 2. Stable cardiomegaly and atherosclerosis of the aortic arch. Electronically Signed   By: Jeb Levering M.D.   On: 07/02/2017 21:40   Dg Chest 2 View  Result Date: 07/02/2017 CLINICAL DATA:  Pneumonia. EXAM: CHEST  2 VIEW COMPARISON:  06/08/2017.  06/07/2017. FINDINGS: Mediastinum and hilar structures normal. Cardiomegaly with normal pulmonary vascularity. Interval clearing of previously identified pulmonary edema from prior study of 06/08/2017 and 06/07/2017 . Mild basilar atelectasis and/or scarring. Degenerative changes thoracic spine with stable compression fractures. IMPRESSION: 1. Stable cardiomegaly. Previously identified pulmonary edema on studies of 06/08/2017 and 06/07/2017 has cleared. 2. Mild basilar atelectasis Electronically Signed   By: Marcello Moores  Register   On: 07/02/2017 14:38   Ct Head Wo Contrast  Result Date: 06/19/2017 CLINICAL DATA:  Neuro deficit, subacute.  Fatigue. EXAM: CT HEAD WITHOUT CONTRAST TECHNIQUE: Contiguous axial images were obtained from the base of the skull through the vertex without intravenous contrast. COMPARISON:  Head CT 11/03/2014 FINDINGS: Brain: Stable degree of atrophy and chronic small vessel ischemia. Remote lacunar infarct versus prominent  perivascular space in the left basal ganglia. No intracranial hemorrhage, mass effect, or midline shift. No hydrocephalus. The basilar cisterns are patent. No evidence of territorial infarct or acute ischemia. No extra-axial or  intracranial fluid collection. Vascular: Atherosclerosis of skullbase vasculature without hyperdense vessel or abnormal calcification. Skull: No skull fracture or focal lesion. Sinuses/Orbits: Paranasal sinuses and mastoid air cells are clear. The visualized orbits are unremarkable. Other: None. IMPRESSION: No acute intracranial abnormality. Stable atrophy and chronic small vessel ischemia. Electronically Signed   By: Jeb Levering M.D.   On: 06/21/2017 23:18   Ct Chest Wo Contrast  Result Date: 07/13/2017 CLINICAL DATA:  Worsening shortness of breath. EXAM: CT CHEST WITHOUT CONTRAST TECHNIQUE: Multidetector CT imaging of the chest was performed following the standard protocol without IV contrast. COMPARISON:  Chest radiographs yesterday demonstrating CHF. FINDINGS: Cardiovascular: Multi chamber cardiomegaly. Coronary artery calcifications. Atherosclerosis of the thoracic aorta. No aortic aneurysm. Possible small pericardial fluid posteriorly measuring 12 mm versus adjacent pleural fluid. Mediastinum/Nodes: Small mediastinal lymph nodes not enlarged by size criteria. Limited assessment for hilar adenopathy given lack contrast, no bulky adenopathy. The upper esophagus is patulous common debris in the mid and distal esophagus. Small low-density left thyroid nodule does not meet size criteria for biopsy. Lungs/Pleura: Small to moderate bilateral pleural effusions with fluid tracking in the right major fissure. Adjacent compressive atelectasis in the lower lobes. Pulmonary edema with smooth septal thickening, peribronchial thickening and scattered ground-glass opacities in the perihilar upper lobes. No confluent pneumonia. No pulmonary mass or evidence of suspicious nodule in the aerated lungs. Trachea and mainstem bronchi are patent. Upper Abdomen: Arms down positioning leads to streak artifact. No evidence of acute abnormality. Musculoskeletal: Exaggerated thoracic kyphosis with mild anterior wedging of T6.  There are no acute or suspicious osseous abnormalities. Chronic degenerative changes of both shoulders. IMPRESSION: 1. Cardiomegaly with pleural effusions and pulmonary edema consistent with CHF. 2. Bronchial thickening, likely secondary to pulmonary edema, superimposed bronchial inflammation not excluded. 3. Patulous esophagus containing intraluminal debris, suggesting esophageal dysmotility. 4. Aortic Atherosclerosis (ICD10-I70.0). Coronary artery calcifications. Electronically Signed   By: Jeb Levering M.D.   On: 07/13/2017 02:36    Time Spent in minutes  25   Louellen Molder M.D on 07/13/2017 at 8:55 AM  Between 7am to 7pm - Pager - [phone]  After 7pm go to [email or web address] - password [facility]  [facility] -  Office  [phone]

## 2017-07-13 NOTE — ED Notes (Signed)
Patient transported to CT 

## 2017-07-13 NOTE — Progress Notes (Signed)
[place] for heparin Indication: h/o apical thrombus  Allergies  Allergen Reactions  . Dilantin [Phenytoin Sodium Extended] Rash    Patient Measurements: Height: 5\' 11"  (180.3 cm) Weight: 151 lb 14.4 oz (68.9 kg) IBW/kg (Calculated) : 75.3  Vital Signs: Temp: 97.7 F (36.5 C) (08/25 2043) Temp Source: Axillary (08/25 2043) BP: 116/93 (08/25 2043) Pulse Rate: 83 (08/25 2043)  Labs:  Recent Labs  07/09/2017 2057 07/13/17 0345 07/13/17 1129 07/13/17 2212  HGB 11.6*  --  12.5*  --   HCT 38.3*  --  39.3  --   PLT 273  --  206  --   APTT  --   --  63* 88*  HEPARINUNFRC  --   --  >2.20*  --   CREATININE 1.72*  --   --   --   TROPONINI  --  0.09*  --   --     Estimated Creatinine Clearance: 38.9 mL/min (A) (by C-G formula based on SCr of 1.72 mg/dL (H)).  Assessment: 71yo male admitted w/ difficulty swallowing and CHF, on Eliquis PTA for h/o apical thrombus, to transition to heparin while NPO; last dose of Eliquis 8/24 at 0900.  Heparin levels elevated secondary to recent Eliquis dose. Will follow aPTTs until correlating with heparin levels.   APTT is therapeutic this evening at 88s. CBC from this morning is stable and no s/s bleeding reported.   Goal of Therapy:  Heparin level 0.3-0.7 units/ml aPTT 66-102 seconds Monitor platelets by anticoagulation protocol: Yes   Plan:  Continue heparin gtt at 1100 units/hr  Confirm with AM labs Heparin level, aPTT, and CBC daily Monitor for s/s bleeding   Argie Ramming, PharmD Clinical Pharmacist 07/13/17 10:59 PM

## 2017-07-13 NOTE — Progress Notes (Signed)
[place] for heparin Indication: h/o apical thrombus  Allergies  Allergen Reactions  . Dilantin [Phenytoin Sodium Extended] Rash    Patient Measurements: Height: 5\' 11"  (180.3 cm) Weight: 151 lb 14.4 oz (68.9 kg) IBW/kg (Calculated) : 75.3  Vital Signs: Temp: 97.9 F (36.6 C) (08/25 1100) Temp Source: Axillary (08/25 1100) BP: 113/88 (08/25 1100) Pulse Rate: 82 (08/25 1100)  Labs:  Recent Labs  06/23/2017 2057 07/13/17 0345 07/13/17 1129  HGB 11.6*  --  12.5*  HCT 38.3*  --  39.3  PLT 273  --  206  APTT  --   --  63*  HEPARINUNFRC  --   --  >2.20*  CREATININE 1.72*  --   --   TROPONINI  --  0.09*  --     Estimated Creatinine Clearance: 38.9 mL/min (A) (by C-G formula based on SCr of 1.72 mg/dL (H)).  Assessment: 71yo male admitted w/ difficulty swallowing and CHF, on Eliquis PTA for h/o apical thrombus, to transition to heparin while NPO; last dose of Eliquis 8/24 at 0900.  Initial heparin level elevated >2.2 (to be expected), and aPTT low at 63 seconds.   Hgb 12.5, plts 206- no bleeding noted.  Goal of Therapy:  Heparin level 0.3-0.7 units/ml aPTT 66-102 seconds Monitor platelets by anticoagulation protocol: Yes   Plan:  Increase heparin to 1100 units/hr Recheck aPTT at 2200 Daily heparin level and aPTT until correlating, daily CBC  Amariah Kierstead D. Sinia Antosh, PharmD, BCPS Clinical Pharmacist Pager: [phone] Clinical Phone for 07/13/2017 until 3:30pm: [phone] If after 3:30pm, please call main pharmacy at [phone] 07/13/2017 1:30 PM

## 2017-07-13 NOTE — Progress Notes (Signed)
Initial Nutrition Assessment  DOCUMENTATION CODES:   Not applicable  INTERVENTION:   RD to assist with maximizing intake pending decisions regarding goals of care.  NUTRITION DIAGNOSIS:   Inadequate oral intake related to inability to eat as evidenced by NPO status.  GOAL:   Provide nutrition as able pending respiratory status and overall goals of care.  MONITOR:   Diet advancement  REASON FOR ASSESSMENT:   Malnutrition Screening Tool    ASSESSMENT:   71 yo male with PMH of ankylosing spondylitis, scleroderma, CREST syndrome, esophageal stricture & stenosis, esophageal motility disorder, seizures, mixed connective tissue disease, asp PNA, CHF, who was admitted on 8/24 with SOB r/t CHF exacerbation.  Unable to speak with patient or complete nutrition focused physical exam at this time. Palliative Care NP and Cardiology in room speaking with patient and family. Patient currently having a lot of respiratory distress and is on BiPAP. Noted patient is DNI/DNR.   Patient has a history of dysphagia, GI following; may need UES dilation when respiratory status improves.  Weight has decreased by 7% in the past month, suspect malnutrition, but unable to obtain enough information at this time.  Labs and medications reviewed.  Diet Order:   NPO  Skin:  Reviewed, no issues  Last BM:  8/24  Height:   Ht Readings from Last 1 Encounters:  07/13/17 5\' 11"  (1.803 m)    Weight:   Wt Readings from Last 1 Encounters:  07/13/17 151 lb 14.4 oz (68.9 kg)    Ideal Body Weight:  78.2 kg  BMI:  Body mass index is 21.19 kg/m.  Estimated Nutritional Needs:   Kcal:  1800-2000  Protein:  80-100 gm  Fluid:  2 L  EDUCATION NEEDS:   No education needs identified at this time  Molli Barrows, [place], [place], [place] Pager [phone] After Hours Pager [phone]

## 2017-07-13 NOTE — Consult Note (Addendum)
Neurology Consultation  Reason for Consult: AED management Referring Physician: Dr Clementeen Graham  CC: NPO patient - need recs for AEDs  History is obtained from: wife, chart  HPI: Robert Carey is a 71 y.o. male who has a past medical history of seizures, ankylosing spondylitis, dementia, depression, scleroderma, who presented to the hospital for evaluation of shortness of breath and is being worked up for pneumonia, possibly secondary to severe esophageal dysmotility. He is currently on BiPAP, and is nothing by mouth. His home medications for his seizures are Keppra and carbamazepine. Keppra has been converted into IV form, but the primary team has consulted neurology for recommendations for his the second antiepileptic since they're unable to obtain oral access because of the BiPAP. No recent seizures. Usually seizures are brought on by sleep deprivation and stressors. His first seizure was in March 2014. That was his only generalized tonic clonic seizures. Since then he is only has come back partial seizures. 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 He sees Dr. Yvonne Kendall at [facility] in [place], who has been his neurologist for an 14 years. His wife has tried to contact Dr. Inocente Salles, and is awaiting response.  Of note, the etiology of his seizures according to the wife is unknown. 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 He does have diagnosis of other autoimmune conditions like ankle spondylitis and scleroderma.  ROS: A 14 point ROS was performed and is negative except as noted in the HPI.   Past Medical History:  Diagnosis Date  . Anxiety   . Arthritis   . AS (ankylosing spondylitis) (HCC)   . Aspiration pneumonia ([place]) 05/2017  . Cataract   . CHF (congestive heart failure) ([place])   . CREST syndrome ([place])   . Dementia    memory loss   . Depression   . Diverticulosis of  colon (without mention of hemorrhage)   . Dyspnea    with exertion   . Esophageal reflux   . Gastroparesis   . Hiatal hernia   . Mixed connective tissue disease ([place])   . Motility disorder, esophageal   . Peripheral artery disease ([place])   . Raynauds phenomenon   . Scleroderma ([place])   . Seizure disorder ([place])   . Seizures ([place])    07/2016  . Skin ulcers of foot, bilateral ([place]) 01/03/2017   R foot only  . Spondylitis, ankylosing ([place])   . Stricture and stenosis of esophagus     Family History  Problem Relation Age of Onset  . Breast cancer Mother   . Pancreatic cancer Mother   . Diabetes Mother   . Heart disease Mother   . Heart disease Father   . COPD Father   . Melanoma Sister   . Stroke Brother   . Colon cancer Neg Hx    Social History:   reports that he has never smoked. He has never used smokeless tobacco. He reports that he does not drink alcohol or use drugs.  Medications  Current Facility-Administered Medications:  .  acetaminophen (TYLENOL) tablet 650 mg, 650 mg, Oral, Q6H PRN **OR** acetaminophen (TYLENOL) suppository 650 mg, 650 mg, Rectal, Q6H PRN, Rise Patience, MD .  chlorhexidine (PERIDEX) 0.12 % solution 15 mL, 15 mL, Mouth Rinse, BID, Rise Patience, MD .  furosemide (LASIX) injection 80 mg, 80 mg, Intravenous, Q12H, Dhungel, Nishant, MD .  heparin ADULT infusion 100 units/mL (25000 units/251mL sodium chloride 0.45%), 1,000 Units/hr, Intravenous, Continuous, Laren Everts, RPH, Last Rate: 10 mL/hr at 07/13/17 0322, 1,000 Units/hr at 07/13/17 0322 .  levETIRAcetam (KEPPRA) 1,500 mg in sodium chloride 0.9 % 100 mL IVPB, 1,500 mg, Intravenous, Q24H, Rise Patience, MD, Stopped at 07/13/17 [id number] .  levETIRAcetam (KEPPRA) 380 mg in sodium chloride 0.9 % 100 mL IVPB, 380 mg, Intravenous, Q24H, Rise Patience, MD, Last Rate: 415.2 mL/hr at 07/13/17 1208, 380 mg at 07/13/17 1208 .  levETIRAcetam (KEPPRA) 500 mg in sodium chloride 0.9 % 100  mL IVPB, 500 mg, Intravenous, Q24H, Rise Patience, MD .  levETIRAcetam (KEPPRA) 750 mg in sodium chloride 0.9 % 100 mL IVPB, 750 mg, Intravenous, Q24H, Rise Patience, MD .  MEDLINE mouth rinse, 15 mL, Mouth Rinse, q12n4p, Rise Patience, MD, 15 mL at 07/13/17 1207 .  methylPREDNISolone sodium succinate (SOLU-MEDROL) 40 mg/mL injection 40 mg, 40 mg, Intravenous, Daily, Dhungel, Nishant, MD, 40 mg at 07/13/17 0914 .  ondansetron (ZOFRAN) tablet 4 mg, 4 mg, Oral, Q6H PRN **OR** ondansetron (ZOFRAN) injection 4 mg, 4 mg, Intravenous, Q6H PRN, Rise Patience, MD .  pantoprazole (PROTONIX) injection 40 mg, 40 mg, Intravenous, Q24H, Rise Patience, MD  Exam: Current vital signs: BP 113/88   Pulse 82   Temp 97.9 F (36.6 C) (Axillary)   Resp (!) 34   Ht 5\' 11"  (1.803 m)   Wt 68.9 kg (151 lb 14.4 oz)   SpO2 100%   BMI 21.19 kg/m  Vital signs in last 24 hours: Temp:  [97.4 F (36.3 C)-99.2 F (37.3 C)] 97.9 F (36.6 C) (08/25 1100) Pulse Rate:  [38-99] 82 (08/25 1100) Resp:  [11-60] 34 (08/25 1100) BP: (99-117)/(70-91) 113/88 (08/25 1100) SpO2:  [59 %-100 %] 100 % (08/25 0741) FiO2 (%):  [30 %] 30 % (08/25 0741) Weight:  [68.9 kg (151 lb 14.4 oz)] 68.9 kg (151 lb 14.4 oz) (08/25 0259)  GENERAL: Awake, alert in some distress using BiPAP HEENT: - Normocephalic and atraumatic, dry mm, no LN++, no Thyromegally LUNGS - scattered wheezing and rales, on BiPAP CV - S1S2 RRR, no m/r/g, equal pulses bilaterally. ABDOMEN - Soft, nontender, nondistended with normoactive BS Ext: warm, well perfused, intact peripheral pulses, no edema  NEURO:  Awake alert oriented 3 Speech is not dysarthric. Naming repetition fluency and complex attention intact Cranial Nerves: PERRL_ 54mm/brisk. EOMI, visual fields full, no facial asymmetry as far as I could tell through the BiPAP, facial sensation intact, hearing intact, tongue/uvula/soft palate midline, normal sternocleidomastoid  and trapezius muscle strength. No evidence of tongue atrophy or fibrillations Motor: Antigravity in all 4 extremities. Tone: Normal tone with decreased bulk all over. Sensation- Intact to light touch bilaterally Coordination: FTN intact bilaterally Gait- deferred  Labs I have reviewed labs in epic and the results pertinent to this consultation are:   CBC    Component Value Date/Time   WBC 9.4 07/16/2017 2057   RBC 4.58 06/21/2017 2057   HGB 11.6 (L) 07/18/2017 2057   HCT 38.3 (L) 06/21/2017 2057   PLT 273 07/06/2017 2057   MCV 83.6 06/22/2017 2057   MCH 25.3 (L) 06/20/2017 2057   MCHC 30.3 07/08/2017 2057   RDW 16.3 (H) 06/30/2017 2057   LYMPHSABS 1.3 06/27/2017 2057   MONOABS 0.9 07/19/2017 2057   EOSABS 0.0 06/28/2017 2057   BASOSABS 0.0 06/20/2017 2057    CMP     Component Value Date/Time  NA 141 07/01/2017 2057   K 4.4 07/11/2017 2057   CL 104 06/19/2017 2057   CO2 19 (L) 07/10/2017 2057   GLUCOSE 151 (H) 07/10/2017 2057   BUN 37 (H) 07/15/2017 2057   CREATININE 1.72 (H) 07/02/2017 2057   CALCIUM 9.5 07/08/2017 2057   PROT 7.8 07/02/2017 2057   ALBUMIN 3.6 06/22/2017 2057   AST 148 (H) 06/28/2017 2057   ALT 121 (H) 06/26/2017 2057   ALKPHOS 143 (H) 07/09/2017 2057   BILITOT 0.7 07/17/2017 2057   GFRNONAA 39 (L) 07/13/2017 2057   GFRAA 45 (L) 07/06/2017 2057   Imaging I have reviewed the images obtained:  CT-scan of the brainShows no acute abnormality. Stable atrophy and chronic small vessel ischemia per final report  Assessment:  71 year old man with a past medical history of seizures, and causing spinal let us, dementia, depression, scleroderma who is being evaluated and treated for pneumonia and esophageal dysmotility, unable to take his oral medications for seizures. Neurology consulted for recommendations on parenteral antiepileptics. Patient's outpatient neurologist has been contacted by the family and her responses awaited.  Impression: Seizure  disorder Multiple toxic metabolic derangements at this time, being managed by medicine.  Recommendations: -c/w Keppra at home dose regimen, which is very atypical but according to the wife and works for him for all day coverage of seizures per his OP neurologist: *1500 mg q9am *375 mg q12pm *500 mg q7pm *750 mg q9pm -Until PO access can be established - will recommend Vimpat 200 mg load followed by Vimpat 100 BID. -Can revert back to the PO meds once able to take PO meds. -Seizure precautions -We will be available as needed. Please call us with questions.  -- Amie Portland, MD [facility] [phone]  If 7pm to 7am, please call on call as listed on AMION.  Wife Phone [phone]

## 2017-07-13 NOTE — H&P (Signed)
History and Physical    Robert Carey QPY:[id number] DOB: June 12, 1946 DOA: 07/17/2017  PCP: Asencion Noble, MD  Patient coming from: Home.  Chief Complaint: Shortness of breath.  HPI: Robert Carey is a 71 y.o. male with history of seizures, connected tissue disorder and ankle icing spondylitis on prednisone and hydroxychloroquine, recent cardiac cath showing severe three-vessel disease not amicable to intervention, recent 2-D echo showing cardiomyopathy likely ischemic was recently admitted for aspiration pneumonia and had EGD done which showed severe esophageal dysmotility was brought to the ER because of worsening shortness of breath. Patient's wife states that patient has been having persistent shortness of breath since last discharge but over the last 24-48 hours it has acutely worsened with lower extremity swelling. Denied any chest pain but has been having some productive cough.   ED Course: In the ER patient was found to have periods of tachypnea alternating with normal breathing. Patient was hypoxic and initially was placed on BiPAP which patient was not able to tolerate. Chest x-ray is consistent with CHF with pleural effusion. CT head was negative for anything acute. Patient was given Lasix 80 mg IV and admitted for acute respiratory failure likely from CHF. In addition patient has been having worsening dysphagia and so Keppra was given IV.  Review of Systems: As per HPI, rest all negative.   Past Medical History:  Diagnosis Date  . Anxiety   . Arthritis   . AS (ankylosing spondylitis) (HCC)   . Aspiration pneumonia ([place]) 05/2017  . Cataract   . CHF (congestive heart failure) ([place])   . CREST syndrome ([place])   . Dementia    memory loss   . Depression   . Diverticulosis of colon (without mention of hemorrhage)   . Dyspnea    with exertion   . Esophageal reflux   . Gastroparesis   . Hiatal hernia   . Mixed connective tissue disease ([place])   . Motility disorder,  esophageal   . Peripheral artery disease ([place])   . Raynauds phenomenon   . Scleroderma ([place])   . Seizure disorder ([place])   . Seizures ([place])    07/2016  . Skin ulcers of foot, bilateral ([place]) 01/03/2017   R foot only  . Spondylitis, ankylosing ([place])   . Stricture and stenosis of esophagus     Past Surgical History:  Procedure Laterality Date  . CORONARY ANGIOGRAPHY N/A 06/28/2017   Procedure: CORONARY ANGIOGRAPHY;  Surgeon: Nelva Bush, MD;  Location: [facility];  Service: Cardiovascular;  Laterality: N/A;  . ESOPHAGOGASTRODUODENOSCOPY (EGD) WITH PROPOFOL N/A 06/07/2017   Procedure: ESOPHAGOGASTRODUODENOSCOPY (EGD) WITH PROPOFOL;  Surgeon: Manus Gunning, MD;  Location: [facility];  Service: Gastroenterology;  Laterality: N/A;  . IR GENERIC HISTORICAL  02/07/2017   IR TIB-PERO ART UNI PTA EA ADD VESSEL MOD SED 02/07/2017 Corrie Mckusick, DO [facility]  . IR GENERIC HISTORICAL  02/07/2017   IR US GUIDE VASC ACCESS LEFT 02/07/2017 Corrie Mckusick, DO [facility]  . IR GENERIC HISTORICAL  02/07/2017   IR TIB-PERO ART PTA MOD SED 02/07/2017 Corrie Mckusick, DO [facility]  . IR GENERIC HISTORICAL  02/07/2017   IR ANGIOGRAM SELECTIVE EACH ADDITIONAL VESSEL 02/07/2017 Corrie Mckusick, DO [facility]  . IR GENERIC HISTORICAL  02/07/2017   IR ANGIOGRAM EXTREMITY RIGHT 02/07/2017 Corrie Mckusick, DO [facility]  . IR GENERIC HISTORICAL  02/07/2017   IR THROMBECT PRIM MECH INIT (INCLU) MOD SED 02/07/2017 Corrie Mckusick, DO [facility]  . IR  RADIOLOGIST EVAL & MGMT  02/27/2017  . IR RADIOLOGIST EVAL & MGMT  01/22/2017  . IR THORACENTESIS ASP PLEURAL SPACE W/IMG GUIDE  06/07/2017  . NOSE SURGERY  2005  . RIGHT HEART CATH N/A 06/28/2017   Procedure: RIGHT HEART CATH;  Surgeon: Nelva Bush, MD;  Location: [facility];  Service: Cardiovascular;  Laterality: N/A;  . SAVORY DILATION N/A 06/07/2017   Procedure: SAVORY DILATION;  Surgeon: Manus Gunning, MD;  Location: [facility]  ENDOSCOPY;  Service: Gastroenterology;  Laterality: N/A;  . THUMB ARTHROSCOPY Left   . TOE SURGERY     right great toe  . WISDOM TOOTH EXTRACTION       reports that he has never smoked. He has never used smokeless tobacco. He reports that he does not drink alcohol or use drugs.  Allergies  Allergen Reactions  . Dilantin [Phenytoin Sodium Extended] Rash    Family History  Problem Relation Age of Onset  . Breast cancer Mother   . Pancreatic cancer Mother   . Diabetes Mother   . Heart disease Mother   . Heart disease Father   . COPD Father   . Melanoma Sister   . Stroke Brother   . Colon cancer Neg Hx     Prior to Admission medications   Medication Sig Start Date End Date Taking? Authorizing Provider  acetaminophen (TYLENOL) 650 MG CR tablet Take 1,300 mg by mouth 2 (two) times daily as needed for pain.   Yes [provider]  apixaban (ELIQUIS) 5 MG TABS tablet Take 1 tablet (5 mg total) by mouth 2 (two) times daily. 07/02/17  Yes Lendon Colonel, NP  aspirin EC 81 MG tablet Take 81 mg by mouth daily.   Yes [provider]  carvedilol (COREG) 3.125 MG tablet Take 1 tablet (3.125 mg total) by mouth 2 (two) times daily. 06/28/17 06/28/18 Yes End, Harrell Gave, MD  clobetasol cream (TEMOVATE) 1.60 % Apply 1 application topically daily as needed (eczema (winter months)).    Yes [provider]  furosemide (LASIX) 20 MG tablet Take 20 mg by mouth daily.  06/21/17  Yes [provider]  HYDROcodone-homatropine (HYDROMET) 5-1.5 MG/5ML syrup Take 5 mLs by mouth every 4 (four) hours as needed for cough.   Yes [provider]  hydroxychloroquine (PLAQUENIL) 200 MG tablet Take 200 mg by mouth 2 (two) times daily with a meal.  04/30/17  Yes [provider]  ketoconazole (NIZORAL) 2 % cream Apply 1 application topically 2 (two) times daily as needed (toe nail fungus).    Yes [provider]  levETIRAcetam (KEPPRA) 250 MG tablet Take 500  mg by mouth every evening. 1900   Yes [provider]  levETIRAcetam (KEPPRA) 750 MG tablet Take 375-1,500 mg by mouth See admin instructions. Take 2 tablets (1500 mg) in the morning, 0.5 tablet (375 mg) at noon, & 1 tablet (750 mg) at 2100.   Yes [provider]  NIFEdipine (PROCARDIA XL/ADALAT-CC) 60 MG 24 hr tablet Take 60 mg by mouth at bedtime.    Yes [provider]  omeprazole (PRILOSEC) 40 MG capsule TAKE (1) CAPSULE BY MOUTH ONCE DAILY. Patient taking differently: TAKE 1 CAPSULE (40 MG) ONCE DAILY BEFORE BREAKFAST 05/27/17  Yes Irene Shipper, MD  OXcarbazepine (TRILEPTAL) 150 MG tablet Take 300-450 mg by mouth See admin instructions. Take 2 tablets (300 mg) by mouth every morning (9am) and 3 tablets (450 mg) at bedtime (9pm)   Yes [provider]  predniSONE (DELTASONE) 5 MG tablet Take 5 mg by mouth daily. (0900)   Yes [provider]  PRESCRIPTION MEDICATION Apply 1 application topically See admin instructions. Pain cream compounded at [facility] - meloxicam/ topiramate/ lidocaine/prilocaine - apply topically once every morning, may also use up to three times daily as needed for neck pain   Yes [provider]  benzonatate (TESSALON PERLES) 100 MG capsule Take 1 capsule (100 mg total) by mouth 3 (three) times daily. Patient not taking: Reported on 07/03/2017 07/02/17   Lendon Colonel, NP  Na Sulfate-K Sulfate-Mg Sulf 17.5-3.13-1.6 GM/180ML SOLN Suprep as directed.  No substitutions. 03/11/17   Irene Shipper, MD    Physical Exam: Vitals:   07/13/17 0145 07/13/17 0200 07/13/17 0215 07/13/17 0233  BP: 115/82 108/78 (!) 103/91   Pulse: 92 91 94 93  Resp: (!) 55 (!) 23 13 11   SpO2: 90% 97% 98% 98%      Constitutional: Moderately built and nourished. Vitals:   07/13/17 0145 07/13/17 0200 07/13/17 0215 07/13/17 0233  BP: 115/82 108/78 (!) 103/91   Pulse: 92 91 94 93  Resp: (!) 55 (!) 23 13 11   SpO2: 90% 97% 98% 98%    Eyes: Anicteric no pallor. ENMT: No discharge from the ears eyes nose and mouth. Neck: JVD elevated. No mass felt. Respiratory: Bilateral air entry present. Crepitations are noted. No rhonchi. Cardiovascular: S1 and S2 heard. Abdomen: Soft nontender bowel sounds present. Musculoskeletal: Mild edema both lower extremities. Skin: No rash. Neurologic: Alert awake oriented to time place and pleasant. Moves all extremities. Psychiatric: Appears normal.   Labs on Admission: I have personally reviewed following labs and imaging studies  CBC:  Recent Labs Lab 06/20/2017 2057  WBC 9.4  NEUTROABS 7.1  HGB 11.6*  HCT 38.3*  MCV 83.6  PLT 732   Basic Metabolic Panel:  Recent Labs Lab 07/10/2017 2057  NA 141  K 4.4  CL 104  CO2 19*  GLUCOSE 151*  BUN 37*  CREATININE 1.72*  CALCIUM 9.5   GFR: Estimated Creatinine Clearance: 38.9 mL/min (A) (by C-G formula based on SCr of 1.72 mg/dL (H)). Liver Function Tests:  Recent Labs Lab 07/13/2017 2057  AST 148*  ALT 121*  ALKPHOS 143*  BILITOT 0.7  PROT 7.8  ALBUMIN 3.6   No results for input(s): LIPASE, AMYLASE in the last 168 hours. No results for input(s): AMMONIA in the last 168 hours. Coagulation Profile: No results for input(s): INR, PROTIME in the last 168 hours. Cardiac Enzymes: No results for input(s): CKTOTAL, CKMB, CKMBINDEX, TROPONINI in the last 168 hours. BNP (last 3 results) No results for input(s): PROBNP in the last 8760 hours. HbA1C: No results for input(s): HGBA1C in the last 72 hours. CBG: No results for input(s): GLUCAP in the last 168 hours. Lipid Profile: No results for input(s): CHOL, HDL, LDLCALC, TRIG, CHOLHDL, LDLDIRECT in the last 72 hours. Thyroid Function Tests: No results for input(s): TSH, T4TOTAL, FREET4, T3FREE, THYROIDAB in the last 72 hours. Anemia Panel: No results for input(s): VITAMINB12, FOLATE, FERRITIN, TIBC, IRON, RETICCTPCT in the last 72 hours. Urine analysis: No results  found for: COLORURINE, APPEARANCEUR, LABSPEC, PHURINE, GLUCOSEU, HGBUR, BILIRUBINUR, KETONESUR, PROTEINUR, UROBILINOGEN, NITRITE, LEUKOCYTESUR Sepsis Labs: @LABRCNTIP (procalcitonin:4,lacticidven:4) )No results found for this or any previous visit (from the past 240 hour(s)).   Radiological Exams on Admission: Dg Chest 2 View  Result Date: 06/22/2017 CLINICAL DATA:  Worsening shortness of breath over the last several days. EXAM: CHEST  2  VIEW COMPARISON:  Radiograph 07/02/2017 FINDINGS: Unchanged cardiomegaly. There is atherosclerosis of the aortic arch. Small to moderate bilateral pleural effusions, increased from prior exam. Vascular congestion with probable Kerley B-lines indicating early pulmonary edema. No pneumothorax. Chronic change about the right shoulder. IMPRESSION: 1. Increased bilateral pleural effusions, vascular congestion or early pulmonary edema consistent with CHF. 2. Stable cardiomegaly and atherosclerosis of the aortic arch. Electronically Signed   By: Jeb Levering M.D.   On: 06/20/2017 21:40   Ct Head Wo Contrast  Result Date: 06/25/2017 CLINICAL DATA:  Neuro deficit, subacute.  Fatigue. EXAM: CT HEAD WITHOUT CONTRAST TECHNIQUE: Contiguous axial images were obtained from the base of the skull through the vertex without intravenous contrast. COMPARISON:  Head CT 11/03/2014 FINDINGS: Brain: Stable degree of atrophy and chronic small vessel ischemia. Remote lacunar infarct versus prominent perivascular space in the left basal ganglia. No intracranial hemorrhage, mass effect, or midline shift. No hydrocephalus. The basilar cisterns are patent. No evidence of territorial infarct or acute ischemia. No extra-axial or intracranial fluid collection. Vascular: Atherosclerosis of skullbase vasculature without hyperdense vessel or abnormal calcification. Skull: No skull fracture or focal lesion. Sinuses/Orbits: Paranasal sinuses and mastoid air cells are clear. The visualized orbits are  unremarkable. Other: None. IMPRESSION: No acute intracranial abnormality. Stable atrophy and chronic small vessel ischemia. Electronically Signed   By: Jeb Levering M.D.   On: 07/10/2017 23:18   Ct Chest Wo Contrast  Result Date: 07/13/2017 CLINICAL DATA:  Worsening shortness of breath. EXAM: CT CHEST WITHOUT CONTRAST TECHNIQUE: Multidetector CT imaging of the chest was performed following the standard protocol without IV contrast. COMPARISON:  Chest radiographs yesterday demonstrating CHF. FINDINGS: Cardiovascular: Multi chamber cardiomegaly. Coronary artery calcifications. Atherosclerosis of the thoracic aorta. No aortic aneurysm. Possible small pericardial fluid posteriorly measuring 12 mm versus adjacent pleural fluid. Mediastinum/Nodes: Small mediastinal lymph nodes not enlarged by size criteria. Limited assessment for hilar adenopathy given lack contrast, no bulky adenopathy. The upper esophagus is patulous common debris in the mid and distal esophagus. Small low-density left thyroid nodule does not meet size criteria for biopsy. Lungs/Pleura: Small to moderate bilateral pleural effusions with fluid tracking in the right major fissure. Adjacent compressive atelectasis in the lower lobes. Pulmonary edema with smooth septal thickening, peribronchial thickening and scattered ground-glass opacities in the perihilar upper lobes. No confluent pneumonia. No pulmonary mass or evidence of suspicious nodule in the aerated lungs. Trachea and mainstem bronchi are patent. Upper Abdomen: Arms down positioning leads to streak artifact. No evidence of acute abnormality. Musculoskeletal: Exaggerated thoracic kyphosis with mild anterior wedging of T6. There are no acute or suspicious osseous abnormalities. Chronic degenerative changes of both shoulders. IMPRESSION: 1. Cardiomegaly with pleural effusions and pulmonary edema consistent with CHF. 2. Bronchial thickening, likely secondary to pulmonary edema, superimposed  bronchial inflammation not excluded. 3. Patulous esophagus containing intraluminal debris, suggesting esophageal dysmotility. 4. Aortic Atherosclerosis (ICD10-I70.0). Coronary artery calcifications. Electronically Signed   By: Jeb Levering M.D.   On: 07/13/2017 02:36    EKG: Independently reviewed. Normal sinus rhythm with poor R-wave progression. Nonspecific ST changes.  Assessment/Plan Principal Problem:   Acute respiratory failure with hypoxia (HCC) Active Problems:   ESOPHAGEAL MOTILITY DISORDER   SCLERODERMA   Ankylosing spondylitis (HCC)   Partial epilepsy (HCC)   Seizure disorder (HCC)   Acute on chronic systolic CHF (congestive heart failure) ([place])    1. Acute respiratory failure with hypoxia  - Likely from CHF exacerbation. Last 2-D echo done in 06/21/2017 was showing  EF of 20-25%. Patient also had recent cardiac cath which was showing severe three-vessel cardiac disease not amenable to intervention. Patient was given Lasix 80 mg IV in the ER and I have placed patient on 40 mg IV every 12. Since patient has had history of aspiration I have ordered CT chest. Since patient also has Cheyne-Stokes like breathing I have ordered MRI brain. Not on ACE inhibitor is on ARB due to renal failure and low normal blood pressure. 2. Dysphagia - during last admission last month EGD was showing severe esophageal dysmotility and had required dilation. For now will keep patient nothing by mouth and on Protonix IV consult [facility] gastroenterologist in a.m. 3. Seizure disorder - patient's oral Keppra has been changed to IV Keppra since patient has dysphagia and is nothing by mouth. Patient's wife is getting in touch with patient's neurologist at [facility] to see replacement for oxcarbazepine and if unable to reason hospitalist in a.m. we will try to reach on-call neurologist. 4. Chronic anemia - follow CBC. 5. Acute renal failure - patient is on diuretics closely follow metabolic panel. 6. History of  connective tissue disorder and ankylosing spondylitis - on chronic steroid use I have placed patient on IV Solu-Medrol for now. Restart hydroxychloroquine when patient can take orally. 7. Hypertension - will keep patient on when necessary IV hydralazine. 8. History of apical thrombus - since patient is nothing by mouth and cannot take oral anticoagulants will keep patient on IV heparin.  I have reviewed patient's old charts and labs.    DVT prophylaxis: Heparin. Code Status: DO NOT RESUSCITATE.  Family Communication: Patient's wife.  Disposition Plan: To be determined.  Consults called: None.  Admission status: Inpatient.    Rise Patience MD [facility] Pager [phone].  If 7PM-7AM, please contact night-coverage [email or web address] Password [facility]  07/13/2017, 2:48 AM

## 2017-07-13 NOTE — Progress Notes (Signed)
Hypoglycemic Event  CBG: 53  Treatment: 1 amp D50  Symptoms: weak, confused  Follow-up CBG: 0818Time:CBG Result: 84 Possible Reasons for Event: NPO  Comments/MD notified: Dr. Clementeen Graham at bedside   [place], [facility]

## 2017-07-13 NOTE — Progress Notes (Signed)
ANTICOAGULATION CONSULT NOTE - Initial Consult  Pharmacy Consult for heparin Indication: h/o apical thrombus  Allergies  Allergen Reactions  . Dilantin [Phenytoin Sodium Extended] Rash    Patient Measurements: Height: 5\' 11"  (180.3 cm) Weight: 151 lb 14.4 oz (68.9 kg) IBW/kg (Calculated) : 75.3  Vital Signs: BP: 103/91 (08/25 0215) Pulse Rate: 93 (08/25 0233)  Labs:  Recent Labs  07/13/2017 2057  HGB 11.6*  HCT 38.3*  PLT 273  CREATININE 1.72*    Estimated Creatinine Clearance: 38.9 mL/min (A) (by C-G formula based on SCr of 1.72 mg/dL (H)).   Medical History: Past Medical History:  Diagnosis Date  . Anxiety   . Arthritis   . AS (ankylosing spondylitis) (HCC)   . Aspiration pneumonia ([place]) 05/2017  . Cataract   . CHF (congestive heart failure) ([place])   . CREST syndrome ([place])   . Dementia    memory loss   . Depression   . Diverticulosis of colon (without mention of hemorrhage)   . Dyspnea    with exertion   . Esophageal reflux   . Gastroparesis   . Hiatal hernia   . Mixed connective tissue disease ([place])   . Motility disorder, esophageal   . Peripheral artery disease ([place])   . Raynauds phenomenon   . Scleroderma ([place])   . Seizure disorder ([place])   . Seizures ([place])    07/2016  . Skin ulcers of foot, bilateral ([place]) 01/03/2017   R foot only  . Spondylitis, ankylosing ([place])   . Stricture and stenosis of esophagus     Assessment: 71yo male admitted w/ difficulty swallowing and CHF, on Eliquis PTA for h/o apical thrombus, to transition to heparin while NPO; last dose of Eliquis 8/24 at 0900.  Goal of Therapy:  Heparin level 0.3-0.7 units/ml aPTT 66-102 seconds Monitor platelets by anticoagulation protocol: Yes   Plan:  Will begin heparin gtt at 1000 units/hr and monitor heparin levels, PTT (while Eliquis affects anti-Xa), and CBC.  Wynona Neat, PharmD, BCPS  07/13/2017,2:51 AM

## 2017-07-13 NOTE — Consult Note (Signed)
Cardiology Consultation:   Patient ID: Robert Carey; [id number]; Mar 17, 1946   Admit date: 07/08/2017 Date of Consult: 07/13/2017  Primary Care Provider: Asencion Noble, MD Primary Cardiologist: Dr Gwenlyn Found  Patient Profile:   Robert Carey is a 71 y.o. 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  History of Present Illness:   Robert Carey is a 71 y.o. male ischemic cardiomyopathy with EF of 20-25% and restrictive pattern f diastolic dysfunction andleft ventricular apical thrombus on recent echo (on eliquis since early this month), severe diffuse small vessel disease on recent cardiac cath on 06/28/2017 recommended for aggressive medical management, A. fib on Eliquis, seizure disorder, PVD mild dementia, ankylosing spondylitis on prednisone and hydroxychloroquine, esophageal dysmotility with oropharyngeal dysphagia (hospitalization one month back with aspiration pneumonia and required EGD with UES dilatation) was brought to the ED by his wife for worsening shortness of breath.  He [place] the beach when he developed progressively worsening SOB, also at rest to the point that he was unable to eat. He was SOB when discharged from the hospital on 06/09/2017 but it progressed rapidly over the last 48 hours. He was brought to the ER and needed BiPAP.  In the ED patient was in acute hypoxic respiratory failure and placed on BiPAP. Chest x-ray consistent with acute pulmonary edema. Head CT was unremarkable. He also has known esophageal stricture, with progressive difficulty swallowing both solid and liquid including his pills. He was scheduled for repeat EGD with dilatation with Dr. Havery Moros on 8/31.  He takes a daily PPI, no breakthrough GERD symptoms.  Keeps head of bed elevated.  Past Medical History:  Diagnosis Date  .  Anxiety   . Arthritis   . AS (ankylosing spondylitis) (HCC)   . Aspiration pneumonia ([place]) 05/2017  . Cataract   . CHF (congestive heart failure) ([place])   . CREST syndrome ([place])   . Dementia    memory loss   . Depression   . Diverticulosis of colon (without mention of hemorrhage)   . Dyspnea    with exertion   . Esophageal reflux   . Gastroparesis   . Hiatal hernia   . Mixed connective tissue disease ([place])   . Motility disorder, esophageal   . Peripheral artery disease ([place])   . Raynauds phenomenon   . Scleroderma ([place])   . Seizure disorder ([place])   . Seizures ([place])    07/2016  . Skin ulcers of foot, bilateral ([place]) 01/03/2017   R foot only  . Spondylitis, ankylosing ([place])   . Stricture and stenosis of esophagus     Past Surgical History:  Procedure Laterality Date  . CORONARY ANGIOGRAPHY N/A 06/28/2017   Procedure: CORONARY ANGIOGRAPHY;  Surgeon: Nelva Bush, MD;  Location: [facility];  Service: Cardiovascular;  Laterality: N/A;  . ESOPHAGOGASTRODUODENOSCOPY (EGD) WITH PROPOFOL N/A 06/07/2017   Procedure: ESOPHAGOGASTRODUODENOSCOPY (EGD) WITH PROPOFOL;  Surgeon: Manus Gunning, MD;  Location: [facility];  Service: Gastroenterology;  Laterality: N/A;  . IR GENERIC HISTORICAL  02/07/2017   IR TIB-PERO ART UNI PTA EA ADD VESSEL MOD SED 02/07/2017 Corrie Mckusick, DO [facility]  . IR GENERIC HISTORICAL  02/07/2017   IR US GUIDE VASC ACCESS LEFT 02/07/2017 Corrie Mckusick, DO [facility]  . IR GENERIC HISTORICAL  02/07/2017   IR TIB-PERO ART  PTA MOD SED 02/07/2017 Corrie Mckusick, DO [facility]  . IR GENERIC HISTORICAL  02/07/2017   IR ANGIOGRAM SELECTIVE EACH ADDITIONAL VESSEL 02/07/2017 Corrie Mckusick, DO [facility]  . IR GENERIC HISTORICAL  02/07/2017   IR ANGIOGRAM EXTREMITY RIGHT 02/07/2017 Corrie Mckusick, DO [facility]  . IR GENERIC HISTORICAL  02/07/2017   IR THROMBECT PRIM MECH INIT (INCLU) MOD SED 02/07/2017 Corrie Mckusick, DO [facility]  . IR RADIOLOGIST  EVAL & MGMT  02/27/2017  . IR RADIOLOGIST EVAL & MGMT  01/22/2017  . IR THORACENTESIS ASP PLEURAL SPACE W/IMG GUIDE  06/07/2017  . NOSE SURGERY  2005  . RIGHT HEART CATH N/A 06/28/2017   Procedure: RIGHT HEART CATH;  Surgeon: Nelva Bush, MD;  Location: [facility];  Service: Cardiovascular;  Laterality: N/A;  . SAVORY DILATION N/A 06/07/2017   Procedure: SAVORY DILATION;  Surgeon: Manus Gunning, MD;  Location: [facility];  Service: Gastroenterology;  Laterality: N/A;  . THUMB ARTHROSCOPY Left   . TOE SURGERY     right great toe  . WISDOM TOOTH EXTRACTION      Inpatient Medications: Scheduled Meds: . chlorhexidine  15 mL Mouth Rinse BID  . furosemide  80 mg Intravenous Q12H  . mouth rinse  15 mL Mouth Rinse q12n4p  . methylPREDNISolone (SOLU-MEDROL) injection  40 mg Intravenous Daily  . pantoprazole (PROTONIX) IV  40 mg Intravenous Q24H   Continuous Infusions: . heparin 1,100 Units/hr (07/13/17 1345)  . lacosamide (VIMPAT) IV    . lacosamide (VIMPAT) IV 200 mg (07/13/17 1343)  . levETIRAcetam Stopped (07/13/17 0258)  . levETIRAcetam Stopped (07/13/17 1233)  . levETIRAcetam    . levETIRAcetam     PRN Meds: acetaminophen **OR** acetaminophen, ondansetron **OR** ondansetron (ZOFRAN) IV  Allergies:    Allergies  Allergen Reactions  . Dilantin [Phenytoin Sodium Extended] Rash    Social History:   Social History   Social History  . Marital status: Married    Spouse name: N/A  . Number of children: 2  . Years of education: N/A   Occupational History  . retired Retired   Social History Main Topics  . Smoking status: Never Smoker  . Smokeless tobacco: Never Used  . Alcohol use No  . Drug use: No  . Sexual activity: Not on file   Other Topics Concern  . Not on file   Social History Narrative  . No narrative on file    Family History:    Family History  Problem Relation Age of Onset  . Breast cancer Mother   . Pancreatic cancer Mother   .  Diabetes Mother   . Heart disease Mother   . Heart disease Father   . COPD Father   . Melanoma Sister   . Stroke Brother   . Colon cancer Neg Hx      ROS:  Please see the history of present illness.  ROS  All other ROS reviewed and negative.     Physical Exam/Data:   Vitals:   07/13/17 0641 07/13/17 0700 07/13/17 0741 07/13/17 1100  BP:  114/84 114/84 113/88  Pulse: 87  78 82  Resp: (!) 34  (!) 24 (!) 34  Temp:  (!) 97.4 F (36.3 C)  97.9 F (36.6 C)  TempSrc:  Axillary  Axillary  SpO2: 99%  100%   Weight:      Height:        Intake/Output Summary (Last 24 hours) at 07/13/17 1407 Last data filed at  07/13/17 1144  Gross per 24 hour  Intake           126.33 ml  Output              300 ml  Net          -173.67 ml   Filed Weights   07/13/17 0233 07/13/17 0259  Weight: 151 lb 14.4 oz (68.9 kg) 151 lb 14.4 oz (68.9 kg)   Body mass index is 21.19 kg/m.  General:  Cachectic, in respiratory distress with Cheyne Stokes breathing HEENT: normal Lymph: no adenopathy Neck: no JVD Endocrine:  No thryomegaly Vascular: No carotid bruits; FA pulses 2+ bilaterally without bruits  Cardiac:  normal S1, S2; RRR; 3/6 systolic murmur Lungs:  crackles bilaterally, no wheezing Abd: soft, nontender, no hepatomegaly  Ext: mild B/L edema Skin: warm and dry  Neuro:  CNs 2-12 intact, no focal abnormalities noted Psych:  somnolent  EKG:  The EKG was personally reviewed and demonstrates:  SR, nonspecific ST T wave abnormalities Telemetry:  Telemetry was personally reviewed and demonstrates:  SR  Relevant CV Studies: - Left ventricle: The cavity size was normal. Wall thickness was   increased in a pattern of mild LVH. Systolic function was   severely reduced. The estimated ejection fraction was in the   range of 20% to 25%. Diffuse hypokinesis. Doppler parameters are   consistent with restrictive physiology, indicative of decreased   left ventricular diastolic compliance and/or  increased left   atrial pressure. Doppler parameters are consistent with high   ventricular filling pressure. There is an apical thrombus   present. - Regional wall motion abnormality: Akinesis of the apical lateral   and apical myocardium. - Aortic valve: Valve area (VTI): 2.1 cm^2. Valve area (Vmax): 2.14   cm^2. - Mitral valve: There was moderate regurgitation. The MR VC is 0.4   cm. - Left atrium: The atrium was mildly dilated. - Right ventricle: The cavity size was mildly dilated. Systolic   function was mildly reduced. - Atrial septum: No defect or patent foramen ovale was identified. - Pulmonary arteries: Systolic pressure was mildly increased. PA   peak pressure: 38 mm Hg (S). - Technically difficult study, echocontrast was used to enhance   visualization.  Laboratory Data:  Chemistry Recent Labs Lab 07/08/2017 2057  NA 141  K 4.4  CL 104  CO2 19*  GLUCOSE 151*  BUN 37*  CREATININE 1.72*  CALCIUM 9.5  GFRNONAA 39*  GFRAA 45*  ANIONGAP 18*     Recent Labs Lab 07/13/2017 2057  PROT 7.8  ALBUMIN 3.6  AST 148*  ALT 121*  ALKPHOS 143*  BILITOT 0.7   Hematology Recent Labs Lab 06/25/2017 2057 07/13/17 1129  WBC 9.4 10.3  RBC 4.58 4.76  HGB 11.6* 12.5*  HCT 38.3* 39.3  MCV 83.6 82.6  MCH 25.3* 26.3  MCHC 30.3 31.8  RDW 16.3* 16.1*  PLT 273 206   Cardiac Enzymes Recent Labs Lab 07/13/17 0345  TROPONINI 0.09*    Recent Labs Lab 07/13/17 0004  TROPIPOC 0.03    BNP Recent Labs Lab 07/09/2017 2057  BNP >4,500.0*    DDimer No results for input(s): DDIMER in the last 168 hours.  Radiology/Studies:  Dg Chest 2 View  Result Date: 07/17/2017 CLINICAL DATA:  Worsening shortness of breath over the last several days. EXAM: CHEST  2 VIEW COMPARISON:  Radiograph 07/02/2017 FINDINGS: Unchanged cardiomegaly. There is atherosclerosis of the aortic arch. Small to moderate bilateral pleural effusions,  increased from prior exam. Vascular congestion with  probable Kerley B-lines indicating early pulmonary edema. No pneumothorax. Chronic change about the right shoulder. IMPRESSION: 1. Increased bilateral pleural effusions, vascular congestion or early pulmonary edema consistent with CHF. 2. Stable cardiomegaly and atherosclerosis of the aortic arch. Electronically Signed   By: Jeb Levering M.D.   On: 07/08/2017 21:40   Ct Head Wo Contrast  Result Date: 07/11/2017 CLINICAL DATA:  Neuro deficit, subacute.  Fatigue. EXAM: CT HEAD WITHOUT CONTRAST TECHNIQUE: Contiguous axial images were obtained from the base of the skull through the vertex without intravenous contrast. COMPARISON:  Head CT 11/03/2014 FINDINGS: Brain: Stable degree of atrophy and chronic small vessel ischemia. Remote lacunar infarct versus prominent perivascular space in the left basal ganglia. No intracranial hemorrhage, mass effect, or midline shift. No hydrocephalus. The basilar cisterns are patent. No evidence of territorial infarct or acute ischemia. No extra-axial or intracranial fluid collection. Vascular: Atherosclerosis of skullbase vasculature without hyperdense vessel or abnormal calcification. Skull: No skull fracture or focal lesion. Sinuses/Orbits: Paranasal sinuses and mastoid air cells are clear. The visualized orbits are unremarkable. Other: None. IMPRESSION: No acute intracranial abnormality. Stable atrophy and chronic small vessel ischemia. Electronically Signed   By: Jeb Levering M.D.   On: 07/02/2017 23:18   Ct Chest Wo Contrast  Result Date: 07/13/2017 CLINICAL DATA:  Worsening shortness of breath. EXAM: CT CHEST WITHOUT CONTRAST TECHNIQUE: Multidetector CT imaging of the chest was performed following the standard protocol without IV contrast. COMPARISON:  Chest radiographs yesterday demonstrating CHF. FINDINGS: Cardiovascular: Multi chamber cardiomegaly. Coronary artery calcifications. Atherosclerosis of the thoracic aorta. No aortic aneurysm. Possible small  pericardial fluid posteriorly measuring 12 mm versus adjacent pleural fluid. Mediastinum/Nodes: Small mediastinal lymph nodes not enlarged by size criteria. Limited assessment for hilar adenopathy given lack contrast, no bulky adenopathy. The upper esophagus is patulous common debris in the mid and distal esophagus. Small low-density left thyroid nodule does not meet size criteria for biopsy. Lungs/Pleura: Small to moderate bilateral pleural effusions with fluid tracking in the right major fissure. Adjacent compressive atelectasis in the lower lobes. Pulmonary edema with smooth septal thickening, peribronchial thickening and scattered ground-glass opacities in the perihilar upper lobes. No confluent pneumonia. No pulmonary mass or evidence of suspicious nodule in the aerated lungs. Trachea and mainstem bronchi are patent. Upper Abdomen: Arms down positioning leads to streak artifact. No evidence of acute abnormality. Musculoskeletal: Exaggerated thoracic kyphosis with mild anterior wedging of T6. There are no acute or suspicious osseous abnormalities. Chronic degenerative changes of both shoulders. IMPRESSION: 1. Cardiomegaly with pleural effusions and pulmonary edema consistent with CHF. 2. Bronchial thickening, likely secondary to pulmonary edema, superimposed bronchial inflammation not excluded. 3. Patulous esophagus containing intraluminal debris, suggesting esophageal dysmotility. 4. Aortic Atherosclerosis (ICD10-I70.0). Coronary artery calcifications. Electronically Signed   By: Jeb Levering M.D.   On: 07/13/2017 02:36     Assessment and Plan:   1. Acute on chronic hypoxic respiratory failure - sec to acute on chronic CHF, , recent aspiration pneumonia, ankylosing spondylitis, on BiPAP with Cheyne Stokes respirations, the patients's wife clear that the patient didn't wish to be on ventilator 2. Acute on chronic combined systolic and diastolic CHF - LVEF 51-76%, restrictive pattern of DD, careful  diuresis, the patient is fluid overloaded but with acute renal failure and hypotensive. Continue lasix 40 mg iv BID. Follow Crea closely. Patients baseline weight in May 2018 - 160 lbs, now 151, however cachectic with weight loss, he is probably 5-6  lbs fluid overloaded 3. Acute renal failure - as above  4. LV thrombus - on iv Heparin  Overall prognosis poor, palliative care bedside, the family is explained that the patient is in multiorgan failure, they are clear that he never wished to be intubated, on ventilator or having CPR performed. We will continue diuresis. We will decide on switching to palliative care based on his progress/decline over the next 24-48 hours. Family understands. They are asking for a nutrition supplements as he hasn't eaten in a week. I would hold off TPN given he can become more fluid overloaded.   Signed, Ena Dawley, MD  07/13/2017 2:07 PM

## 2017-07-13 NOTE — Progress Notes (Signed)
Critical Troponin (0.09) called to me by the lab at 0512. At 0514, text paged M. Lynch (on call floor coverage) to notify them of the same. No new orders at this time

## 2017-07-13 NOTE — Consult Note (Signed)
Consultation Note Date: 07/13/2017   Patient Name: Robert Carey  DOB: [birth year]  MRN: [id number]  Age / Sex: 71 y.o., male  PCP: Asencion Noble, MD Referring Physician: Louellen Molder, MD  Reason for Consultation: Establishing goals of care and Psychosocial/spiritual support  HPI/Patient Profile: 71 y.o. male  with past medical history of Seizures, connective tissue disorder, ankylosing spondylitis, recent cardiac cath showing severe three-vessel disease ( not amenable to a CABG or stenting), severe esophageal dysmotility (underwent EGD with dilation approximately one month ago) admitted on 06/30/2017 with worsening shortness of breath and inability to swallow.  Patient is admitted to the stepdown unit and is currently on BiPAP and appears critically ill. Consult was for goals of care.   Clinical Assessment and Goals of Care: Met with patient's wife initially to discuss overall clinical condition as well as to engage in an introduction to palliative care services, as well as life review. Patient was diagnosed with a connective tissue disorder in his 45s, and ankylosing spondylitis in his 107s. 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 Since that time he has had memory deficits but was functional at home in terms of being able to ambulate, and it correlated, recognized all of his family, and was able to feed himself.  He developed new cough and shortness of breath beginning in July. He underwent a cardiac catheter which revealed underlying severe three-vessel cardiac coronary artery disease, as well as congestive heart failure with an EF of 20-25%. Additionally patient's renal function is now declining and his creatinine upon admission is 1.72. He is now on a BiPAP and experiencing Cheyne-Stokes respirations on a regular basis with respiratory  rate reaching 45/m, followed by 10-15 seconds of apnea.  Patient's wife, Levar Fayson, would be patient's healthcare proxy; [phone]. They have 2 children together, and 5 grandchildren    SUMMARY OF RECOMMENDATIONS   DNR/DNI. I did confirm this, and this was confirmed in the presence of Dr. Meda Coffee with cardiology I did share with spouse that I was very concerned for patient, that he appeared very fragile to me particularly his respiratory pattern that I was observing. I did attempt to prepare her that he is at high risk for an acute event.  At this point, continue to treat the treatable but I'm concerned the patient will not survive this hospitalization Introduced concepts of feeding tubes, benefits as well as risks, specifically that they do not avoid aspiration pneumonia; also concerned with  combined heart failure with an EF of 20-25% that he could be easily volume overloaded Plan to meet with patient's wife on July 22, 2017 at 10 AM Code Status/Advance Care Planning:  DNR    Symptom Management:   Dyspnea: Continue with BiPAP for now  Palliative Prophylaxis:   Aspiration, Bowel Regimen, Delirium Protocol, Eye Care, Frequent Pain Assessment, Oral Care and Turn Reposition  Additional Recommendations (Limitations, Scope, Preferences):  Limitations at this point to care are DNR/DNI  Psycho-social/Spiritual:  Desire for further Chaplaincy support:no  Additional Recommendations: Grief/Bereavement Support  Prognosis:   Unable to determine at this point. Patient is critically ill and I would not be surprised if he had a sudden acute episode that ended his life, or if he did not survive this hospitalization  Discharge Planning: To Be Determined      Primary Diagnoses: Present on Admission: . Acute respiratory failure with hypoxia ([place]) . SCLERODERMA . ESOPHAGEAL MOTILITY DISORDER . Ankylosing spondylitis ([place]) . Acute on chronic systolic CHF (congestive heart failure)  ([place])   I have reviewed the medical record, interviewed the patient and family, and examined the patient. The following aspects are pertinent.  Past Medical History:  Diagnosis Date  . Anxiety   . Arthritis   . AS (ankylosing spondylitis) ([facility])   . Aspiration pneumonia ([place]) 05/2017  . Cataract   . CHF (congestive heart failure) ([place])   . CREST syndrome ([place])   . Dementia    memory loss   . Depression   . Diverticulosis of colon (without mention of hemorrhage)   . Dyspnea    with exertion   . Esophageal reflux   . Gastroparesis   . Hiatal hernia   . Mixed connective tissue disease ([place])   . Motility disorder, esophageal   . Peripheral artery disease ([place])   . Raynauds phenomenon   . Scleroderma ([place])   . Seizure disorder ([place])   . Seizures ([place])    07/2016  . Skin ulcers of foot, bilateral ([place]) 01/03/2017   R foot only  . Spondylitis, ankylosing ([place])   . Stricture and stenosis of esophagus    Social History   Social History  . Marital status: Married    Spouse name: N/A  . Number of children: 2  . Years of education: N/A   Occupational History  . retired Retired   Social History Main Topics  . Smoking status: Never Smoker  . Smokeless tobacco: Never Used  . Alcohol use No  . Drug use: No  . Sexual activity: Not Asked   Other Topics Concern  . None   Social History Narrative  . None   Family History  Problem Relation Age of Onset  . Breast cancer Mother   . Pancreatic cancer Mother   . Diabetes Mother   . Heart disease Mother   . Heart disease Father   . COPD Father   . Melanoma Sister   . Stroke Brother   . Colon cancer Neg Hx    Scheduled Meds: . chlorhexidine  15 mL Mouth Rinse BID  . furosemide  80 mg Intravenous Q12H  . mouth rinse  15 mL Mouth Rinse q12n4p  . methylPREDNISolone (SOLU-MEDROL) injection  40 mg Intravenous Daily  . pantoprazole (PROTONIX) IV  40 mg Intravenous Q24H   Continuous Infusions: . heparin 1,100 Units/hr  (07/13/17 1345)  . lacosamide (VIMPAT) IV    . levETIRAcetam Stopped (07/13/17 6962)  . levETIRAcetam Stopped (07/13/17 1233)  . levETIRAcetam    . levETIRAcetam     PRN Meds:.acetaminophen **OR** acetaminophen, ondansetron **OR** ondansetron (ZOFRAN) IV Medications Prior to Admission:  Prior to Admission medications   Medication Sig Start Date End Date Taking? Authorizing Provider  acetaminophen (TYLENOL) 650 MG CR tablet Take 1,300 mg by mouth 2 (two) times daily as needed for pain.   Yes [provider]  apixaban (ELIQUIS) 5 MG TABS tablet Take 1 tablet (5 mg total) by mouth 2 (two) times daily. 07/02/17  Yes Purcell Nails,  Phill Myron, NP  aspirin EC 81 MG tablet Take 81 mg by mouth daily.   Yes [provider]  carvedilol (COREG) 3.125 MG tablet Take 1 tablet (3.125 mg total) by mouth 2 (two) times daily. 06/28/17 06/28/18 Yes End, Harrell Gave, MD  clobetasol cream (TEMOVATE) 7.56 % Apply 1 application topically daily as needed (eczema (winter months)).    Yes [provider]  furosemide (LASIX) 20 MG tablet Take 20 mg by mouth daily.  06/21/17  Yes [provider]  HYDROcodone-homatropine (HYDROMET) 5-1.5 MG/5ML syrup Take 5 mLs by mouth every 4 (four) hours as needed for cough.   Yes [provider]  hydroxychloroquine (PLAQUENIL) 200 MG tablet Take 200 mg by mouth 2 (two) times daily with a meal.  04/30/17  Yes [provider]  ketoconazole (NIZORAL) 2 % cream Apply 1 application topically 2 (two) times daily as needed (toe nail fungus).    Yes [provider]  levETIRAcetam (KEPPRA) 250 MG tablet Take 500 mg by mouth every evening. 1900   Yes [provider]  levETIRAcetam (KEPPRA) 750 MG tablet Take 375-1,500 mg by mouth See admin instructions. Take 2 tablets (1500 mg) in the morning, 0.5 tablet (375 mg) at noon, & 1 tablet (750 mg) at 2100.   Yes [provider]  NIFEdipine (PROCARDIA XL/ADALAT-CC) 60 MG 24 hr tablet  Take 60 mg by mouth at bedtime.    Yes [provider]  omeprazole (PRILOSEC) 40 MG capsule TAKE (1) CAPSULE BY MOUTH ONCE DAILY. Patient taking differently: TAKE 1 CAPSULE (40 MG) ONCE DAILY BEFORE BREAKFAST 05/27/17  Yes Irene Shipper, MD  OXcarbazepine (TRILEPTAL) 150 MG tablet Take 300-450 mg by mouth See admin instructions. Take 2 tablets (300 mg) by mouth every morning (9am) and 3 tablets (450 mg) at bedtime (9pm)   Yes [provider]  predniSONE (DELTASONE) 5 MG tablet Take 5 mg by mouth daily. (0900)   Yes [provider]  PRESCRIPTION MEDICATION Apply 1 application topically See admin instructions. Pain cream compounded at [facility] - meloxicam/ topiramate/ lidocaine/prilocaine - apply topically once every morning, may also use up to three times daily as needed for neck pain   Yes [provider]  benzonatate (TESSALON PERLES) 100 MG capsule Take 1 capsule (100 mg total) by mouth 3 (three) times daily. Patient not taking: Reported on 06/28/2017 07/02/17   Lendon Colonel, NP  Na Sulfate-K Sulfate-Mg Sulf 17.5-3.13-1.6 GM/180ML SOLN Suprep as directed.  No substitutions. 03/11/17   Irene Shipper, MD   Allergies  Allergen Reactions  . Dilantin [Phenytoin Sodium Extended] Rash   Review of Systems  Unable to perform ROS: Acuity of condition    Physical Exam  Constitutional:  Acutely ill appearing older man; wearing BiPAP   HENT:  Head: Normocephalic and atraumatic.  Cardiovascular:  Irregular  Pulmonary/Chest:  Patient having a Cheyne-Stokes respiratory pattern with frequent 10-15 second apnea while on the BiPAP Sats in the 70s to low 80s at times  Genitourinary:  Genitourinary Comments: Foley catheter  Neurological:  Minimally responsive except to noxious stimuli  Skin: There is pallor.  Cool; knees mottled  Psychiatric:  Unable to test  Nursing note and vitals reviewed.   Vital Signs: BP 113/88   Pulse 82   Temp 97.9 F  (36.6 C) (Axillary)   Resp (!) 34   Ht 5' 11"  (1.803 m)   Wt 68.9 kg (151 lb 14.4 oz)   SpO2 100%   BMI 21.19  kg/m  Pain Assessment: No/denies pain   Pain Score: 0-No pain   SpO2: SpO2: 100 % O2 Device:SpO2: 100 % O2 Flow Rate: .O2 Flow Rate (L/min): 3 L/min  IO: Intake/output summary:  Intake/Output Summary (Last 24 hours) at 07/13/17 1515 Last data filed at 07/13/17 1144  Gross per 24 hour  Intake           126.33 ml  Output              300 ml  Net          -173.67 ml    LBM: Last BM Date: 07/11/2017 Baseline Weight: Weight: 68.9 kg (151 lb 14.4 oz) Most recent weight: Weight: 68.9 kg (151 lb 14.4 oz)     Palliative Assessment/Data:   Flowsheet Rows     Most Recent Value  Intake Tab  Referral Department  Hospitalist  Unit at Time of Referral  Intermediate Care Unit  Palliative Care Primary Diagnosis  Pulmonary  Date Notified  07/13/17  Palliative Care Type  New Palliative care  Reason for referral  Clarify Goals of Care, Psychosocial or Spiritual support  Date of Admission  06/23/2017  Date first seen by Palliative Care  07/13/17  # of days Palliative referral response time  0 Day(s)  # of days IP prior to Palliative referral  1  Clinical Assessment  Palliative Performance Scale Score  30%  Pain Max last 24 hours  Not able to report  Pain Min Last 24 hours  Not able to report  Dyspnea Max Last 24 Hours  Not able to report  Dyspnea Min Last 24 hours  Not able to report  Nausea Max Last 24 Hours  Not able to report  Nausea Min Last 24 Hours  Not able to report  Anxiety Max Last 24 Hours  Not able to report  Anxiety Min Last 24 Hours  Not able to report  Other Max Last 24 Hours  Not able to report  Psychosocial & Spiritual Assessment  Palliative Care Outcomes  Patient/Family meeting held?  Yes  Who was at the meeting?  met with wife  Patient/Family wishes: Interventions discontinued/not started   Mechanical Ventilation, Trach  Palliative Care follow-up planned   Yes, Facility      Time In: 1330 Time Out: 1500 Time Total: 90 min Greater than 50%  of this time was spent counseling and coordinating care related to the above assessment and plan. Staffed with Dr. Meda Coffee, Dr. Clementeen Graham  Signed by: Dory Horn, NP   Please contact Palliative Medicine Team phone at [phone] for questions and concerns.  For individual provider: See Shea Evans

## 2017-07-13 NOTE — Consult Note (Signed)
Referring Provider: Dr. Clementeen Graham, [facility] Primary Care Physician:  Asencion Noble, MD Primary Gastroenterologist:  Dr. Havery Moros  Reason for Consultation:  Dysphagia  HPI: Robert Carey is a 71 y.o. male ischemic cardiomyopathy with EF of 20-25% and left ventricular apical thrombus on recent echo (on eliquis since early this month), severe diffuse small vessel disease on recent cardiac cath recommended for aggressive medical management, A. fib on Eliquis, seizure disorder, mild dementia, ankylosing spondylitis on prednisone and hydroxychloroquine, esophageal dysmotility with oropharyngeal dysphagia (hospitalization one month back with aspiration pneumonia and required EGD with UES dilatation) was brought to the ED by his wife for worsening shortness of breath. Patient was having persistent dyspnea since his hospital discharge one month back but progressed rapidly in the past 48 hours. He also noticed increased lower leg swelling.  In the ED patient was in acute hypoxic respiratory failure and placed on BiPAP. Chest x-ray consistent with acute pulmonary edema. Head CT was unremarkable. Patient given 40 mg IV Lasix and admitted to stepdown unit.   Again with progressive difficulty swallowing both solid and liquid including his pills. He was scheduled for repeat EGD with dilatation with Dr. Havery Moros on 8/31.  He takes a daily PPI, no breakthrough GERD symptoms.  Keeps head of bed elevated.  EGD 7/20 showed the following:  - Residual secretions lining the entire esosphagus, consistent with severe dysmotility. No obvious stricture noted in the distal esopohagus - Empiric dilation performed to 14m using Savory dilator, large dilation met with resistance - suspect the patient could have UES stenosis contributing to aspiration as well - Two gastric polyps. Biopsied. - Normal duodenal bulb and second portion of the duodenum. *Overall, the patient has evidence of severe dysmotility on this exam which  is likely causing his symptoms. I suspect there is a component of cricopharyngeal bar or UES stenosis contributing to dysphagia as well.   Past Medical History:  Diagnosis Date  . Anxiety   . Arthritis   . AS (ankylosing spondylitis) (HCC)   . Aspiration pneumonia ([place] 05/2017  . Cataract   . CHF (congestive heart failure) ([place]   . CREST syndrome ([place]   . Dementia    memory loss   . Depression   . Diverticulosis of colon (without mention of hemorrhage)   . Dyspnea    with exertion   . Esophageal reflux   . Gastroparesis   . Hiatal hernia   . Mixed connective tissue disease ([place]   . Motility disorder, esophageal   . Peripheral artery disease ([place]   . Raynauds phenomenon   . Scleroderma ([place]   . Seizure disorder ([place]   . Seizures ([place]    07/2016  . Skin ulcers of foot, bilateral ([place] 01/03/2017   R foot only  . Spondylitis, ankylosing ([place]   . Stricture and stenosis of esophagus     Past Surgical History:  Procedure Laterality Date  . CORONARY ANGIOGRAPHY N/A 06/28/2017   Procedure: CORONARY ANGIOGRAPHY;  Surgeon: ENelva Bush MD;  [facility];  Service: Cardiovascular;  Laterality: N/A;  . ESOPHAGOGASTRODUODENOSCOPY (EGD) WITH PROPOFOL N/A 06/07/2017   Procedure: ESOPHAGOGASTRODUODENOSCOPY (EGD) WITH PROPOFOL;  Surgeon: AManus Gunning MD;  [place]  Service: Gastroenterology;  Laterality: N/A;  . IR GENERIC HISTORICAL  02/07/2017   IR TIB-PERO ART UNI PTA EA ADD VESSEL MOD SED 02/07/2017 JCorrie Mckusick DO [facility]  . IR GENERIC HISTORICAL  02/07/2017   IR [place] V[place] ACCESS LEFT 02/07/2017 JYork Cerise  Wagner, DO [facility]  . IR GENERIC HISTORICAL  02/07/2017   IR TIB-PERO ART PTA MOD SED 02/07/2017 Corrie Mckusick, DO [facility]  . IR GENERIC HISTORICAL  02/07/2017   IR ANGIOGRAM SELECTIVE EACH ADDITIONAL VESSEL 02/07/2017 Corrie Mckusick, DO [facility]  . IR GENERIC HISTORICAL  02/07/2017   IR ANGIOGRAM EXTREMITY RIGHT  02/07/2017 Corrie Mckusick, DO [facility]  . IR GENERIC HISTORICAL  02/07/2017   IR THROMBECT PRIM MECH INIT (INCLU) MOD SED 02/07/2017 Corrie Mckusick, DO [facility]  . IR RADIOLOGIST EVAL & MGMT  02/27/2017  . IR RADIOLOGIST EVAL & MGMT  01/22/2017  . IR THORACENTESIS ASP PLEURAL SPACE W/IMG GUIDE  06/07/2017  . NOSE SURGERY  2005  . RIGHT HEART CATH N/A 06/28/2017   Procedure: RIGHT HEART CATH;  Surgeon: Nelva Bush, MD;  Location: [place] [facility];  Service: Cardiovascular;  Laterality: N/A;  . SAVORY DILATION N/A 06/07/2017   Procedure: SAVORY DILATION;  Surgeon: Manus Gunning, MD;  Location: [facility] ENDOSCOPY;  Service: Gastroenterology;  Laterality: N/A;  . THUMB ARTHROSCOPY Left   . TOE SURGERY     right great toe  . WISDOM TOOTH EXTRACTION      Prior to Admission medications   Medication Sig Start Date End Date Taking? Authorizing Provider  acetaminophen (TYLENOL) 650 MG CR tablet Take 1,300 mg by mouth 2 (two) times daily as needed for pain.   Yes [provider]  apixaban (ELIQUIS) 5 MG TABS tablet Take 1 tablet (5 mg total) by mouth 2 (two) times daily. 07/02/17  Yes Lendon Colonel, NP  aspirin EC 81 MG tablet Take 81 mg by mouth daily.   Yes [provider]  carvedilol (COREG) 3.125 MG tablet Take 1 tablet (3.125 mg total) by mouth 2 (two) times daily. 06/28/17 06/28/18 Yes End, Harrell Gave, MD  clobetasol cream (TEMOVATE) 1.61 % Apply 1 application topically daily as needed (eczema (winter months)).    Yes [provider]  furosemide (LASIX) 20 MG tablet Take 20 mg by mouth daily.  06/21/17  Yes [provider]  HYDROcodone-homatropine (HYDROMET) 5-1.5 MG/5ML syrup Take 5 mLs by mouth every 4 (four) hours as needed for cough.   Yes [provider]  hydroxychloroquine (PLAQUENIL) 200 MG tablet Take 200 mg by mouth 2 (two) times daily with a meal.  04/30/17  Yes [provider]  ketoconazole (NIZORAL) 2 % cream Apply 1  application topically 2 (two) times daily as needed (toe nail fungus).    Yes [provider]  levETIRAcetam (KEPPRA) 250 MG tablet Take 500 mg by mouth every evening. 1900   Yes [provider]  levETIRAcetam (KEPPRA) 750 MG tablet Take 375-1,500 mg by mouth See admin instructions. Take 2 tablets (1500 mg) in the morning, 0.5 tablet (375 mg) at noon, & 1 tablet (750 mg) at 2100.   Yes [provider]  NIFEdipine (PROCARDIA XL/ADALAT-CC) 60 MG 24 hr tablet Take 60 mg by mouth at bedtime.    Yes [provider]  omeprazole (PRILOSEC) 40 MG capsule TAKE (1) CAPSULE BY MOUTH ONCE DAILY. Patient taking differently: TAKE 1 CAPSULE (40 MG) ONCE DAILY BEFORE BREAKFAST 05/27/17  Yes Irene Shipper, MD  OXcarbazepine (TRILEPTAL) 150 MG tablet Take 300-450 mg by mouth See admin instructions. Take 2 tablets (300 mg) by mouth every morning (9am) and 3 tablets (450 mg) at bedtime (9pm)   Yes [provider]  predniSONE (DELTASONE) 5 MG tablet Take 5 mg by  mouth daily. (0900)   Yes [provider]  PRESCRIPTION MEDICATION Apply 1 application topically See admin instructions. Pain cream compounded at [facility] - meloxicam/ topiramate/ lidocaine/prilocaine - apply topically once every morning, may also use up to three times daily as needed for neck pain   Yes [provider]  benzonatate (TESSALON PERLES) 100 MG capsule Take 1 capsule (100 mg total) by mouth 3 (three) times daily. Patient not taking: Reported on 07/05/2017 07/02/17   Lendon Colonel, NP  Na Sulfate-K Sulfate-Mg Sulf 17.5-3.13-1.6 GM/180ML SOLN Suprep as directed.  No substitutions. 03/11/17   Irene Shipper, MD    Current Facility-Administered Medications  Medication Dose Route Frequency Provider Last Rate Last Dose  . acetaminophen (TYLENOL) tablet 650 mg  650 mg Oral Q6H PRN Rise Patience, MD       Or  . acetaminophen (TYLENOL) suppository 650 mg  650 mg Rectal Q6H PRN  Rise Patience, MD      . chlorhexidine (PERIDEX) 0.12 % solution 15 mL  15 mL Mouth Rinse BID Rise Patience, MD      . furosemide (LASIX) injection 80 mg  80 mg Intravenous Q12H Dhungel, Nishant, MD      . heparin ADULT infusion 100 units/mL (25000 units/290m sodium chloride 0.45%)  1,000 Units/hr Intravenous Continuous BLaren Everts RPH 10 mL/hr at 07/13/17 0322 1,000 Units/hr at 07/13/17 0322  . levETIRAcetam (KEPPRA) 1,500 mg in sodium chloride 0.9 % 100 mL IVPB  1,500 mg Intravenous Q24H KRise Patience MD 460 mL/hr at 07/13/17 0907 1,500 mg at 07/13/17 0907  . levETIRAcetam (KEPPRA) 380 mg in sodium chloride 0.9 % 100 mL IVPB  380 mg Intravenous Q24H KRise Patience MD      . levETIRAcetam (KEPPRA) 500 mg in sodium chloride 0.9 % 100 mL IVPB  500 mg Intravenous Q24H KRise Patience MD      . levETIRAcetam (KEPPRA) 750 mg in sodium chloride 0.9 % 100 mL IVPB  750 mg Intravenous Q24H KRise Patience MD      . MEDLINE mouth rinse  15 mL Mouth Rinse q12n4p KRise Patience MD      . methylPREDNISolone sodium succinate (SOLU-MEDROL) 40 mg/mL injection 40 mg  40 mg Intravenous Daily Dhungel, Nishant, MD   40 mg at 07/13/17 0914  . ondansetron (ZOFRAN) tablet 4 mg  4 mg Oral Q6H PRN KRise Patience MD       Or  . ondansetron ([facility] injection 4 mg  4 mg Intravenous Q6H PRN KRise Patience MD      . pantoprazole (PROTONIX) injection 40 mg  40 mg Intravenous Q24H KRise Patience MD        Allergies as of 06/20/2017 - Review Complete 06/30/2017  Allergen Reaction Noted  . Dilantin [phenytoin sodium extended] Rash 03/21/2015    Family History  Problem Relation Age of Onset  . Breast cancer Mother   . Pancreatic cancer Mother   . Diabetes Mother   . Heart disease Mother   . Heart disease Father   . COPD Father   . Melanoma Sister   . Stroke Brother   . Colon cancer Neg Hx     Social History   Social History  . Marital  status: Married    Spouse name: N/A  . Number of children: 2  . Years of education: N/A   Occupational History  . retired Retired   Social History Main Topics  .  Smoking status: Never Smoker  . Smokeless tobacco: Never Used  . Alcohol use No  . Drug use: No  . Sexual activity: Not on file   Other Topics Concern  . Not on file   Social History Narrative  . No narrative on file    Review of Systems: ROS is O/W negative except as mentioned in HPI.  Physical Exam: Vital signs in last 24 hours: Temp:  [97.4 F (36.3 C)-99.2 F (37.3 C)] 97.4 F (36.3 C) (08/25 0700) Pulse Rate:  [38-99] 78 (08/25 0741) Resp:  [11-60] 24 (08/25 0741) BP: (99-117)/(70-91) 114/84 (08/25 0741) SpO2:  [59 %-100 %] 100 % (08/25 0741) FiO2 (%):  [30 %] 30 % (08/25 0741) Weight:  [151 lb 14.4 oz (68.9 kg)] 151 lb 14.4 oz (68.9 kg) (08/25 0259) Last BM Date: 07/06/2017 General:  Alert, thin and frail.  On BiPap. Head:  Normocephalic and atraumatic. Eyes:  Sclera clear, no icterus.  Conjunctiva pink. Ears:  Normal auditory acuity. Mouth:  No deformity or lesions.   Lungs:  Crackles noted B/L.  Tachypnea alternating with apnea. Heart:  Regular rate and rhythm; no murmurs, clicks, rubs, or gallops. Abdomen:  Soft, non-distended.  BS present.  Non-tender. Rectal:  Deferred  Msk:  Symmetrical without gross deformities. Pulses:  Normal pulses noted. Extremities:  Without clubbing or edema. Neurologic:  Alert and oriented x 4;  grossly normal neurologically. Skin:  Intact without significant lesions or rashes. Psych:  Alert and cooperative. Normal mood and affect.  Intake/Output from previous day: 08/24 0701 - 08/25 0700 In: 126.3 [I.V.:16.3; IV Piggyback:110] Out: 300 [Urine:300]  Lab Results:  Recent Labs  07/01/2017 2057  WBC 9.4  HGB 11.6*  HCT 38.3*  PLT 273   BMET  Recent Labs  06/20/2017 2057  NA 141  K 4.4  CL 104  CO2 19*  GLUCOSE 151*  BUN 37*  CREATININE 1.72*  CALCIUM  9.5   LFT  Recent Labs  06/27/2017 2057  PROT 7.8  ALBUMIN 3.6  AST 148*  ALT 121*  ALKPHOS 143*  BILITOT 0.7   Studies/Results: Dg Chest 2 View  Result Date: 07/16/2017 CLINICAL DATA:  Worsening shortness of breath over the last several days. EXAM: CHEST  2 VIEW COMPARISON:  Radiograph 07/02/2017 FINDINGS: Unchanged cardiomegaly. There is atherosclerosis of the aortic arch. Small to moderate bilateral pleural effusions, increased from prior exam. Vascular congestion with probable Kerley B-lines indicating early pulmonary edema. No pneumothorax. Chronic change about the right shoulder. IMPRESSION: 1. Increased bilateral pleural effusions, vascular congestion or early pulmonary edema consistent with CHF. 2. Stable cardiomegaly and atherosclerosis of the aortic arch. Electronically Signed   By: Jeb Levering M.D.   On: 07/02/2017 21:40   Ct Head Wo Contrast  Result Date: 07/07/2017 CLINICAL DATA:  Neuro deficit, subacute.  Fatigue. EXAM: CT HEAD WITHOUT CONTRAST TECHNIQUE: Contiguous axial images were obtained from the base of the skull through the vertex without intravenous contrast. COMPARISON:  Head CT 11/03/2014 FINDINGS: Brain: Stable degree of atrophy and chronic small vessel ischemia. Remote lacunar infarct versus prominent perivascular space in the left basal ganglia. No intracranial hemorrhage, mass effect, or midline shift. No hydrocephalus. The basilar cisterns are patent. No evidence of territorial infarct or acute ischemia. No extra-axial or intracranial fluid collection. Vascular: Atherosclerosis of skullbase vasculature without hyperdense vessel or abnormal calcification. Skull: No skull fracture or focal lesion. Sinuses/Orbits: Paranasal sinuses and mastoid air cells are clear. The visualized orbits are unremarkable. Other: None. IMPRESSION:  No acute intracranial abnormality. Stable atrophy and chronic small vessel ischemia. Electronically Signed   By: Jeb Levering M.D.   On:  07/05/2017 23:18   Ct Chest Wo Contrast  Result Date: 07/13/2017 CLINICAL DATA:  Worsening shortness of breath. EXAM: CT CHEST WITHOUT CONTRAST TECHNIQUE: Multidetector CT imaging of the chest was performed following the standard protocol without IV contrast. COMPARISON:  Chest radiographs yesterday demonstrating CHF. FINDINGS: Cardiovascular: Multi chamber cardiomegaly. Coronary artery calcifications. Atherosclerosis of the thoracic aorta. No aortic aneurysm. Possible small pericardial fluid posteriorly measuring 12 mm versus adjacent pleural fluid. Mediastinum/Nodes: Small mediastinal lymph nodes not enlarged by size criteria. Limited assessment for hilar adenopathy given lack contrast, no bulky adenopathy. The upper esophagus is patulous common debris in the mid and distal esophagus. Small low-density left thyroid nodule does not meet size criteria for biopsy. Lungs/Pleura: Small to moderate bilateral pleural effusions with fluid tracking in the right major fissure. Adjacent compressive atelectasis in the lower lobes. Pulmonary edema with smooth septal thickening, peribronchial thickening and scattered ground-glass opacities in the perihilar upper lobes. No confluent pneumonia. No pulmonary mass or evidence of suspicious nodule in the aerated lungs. Trachea and mainstem bronchi are patent. Upper Abdomen: Arms down positioning leads to streak artifact. No evidence of acute abnormality. Musculoskeletal: Exaggerated thoracic kyphosis with mild anterior wedging of T6. There are no acute or suspicious osseous abnormalities. Chronic degenerative changes of both shoulders. IMPRESSION: 1. Cardiomegaly with pleural effusions and pulmonary edema consistent with CHF. 2. Bronchial thickening, likely secondary to pulmonary edema, superimposed bronchial inflammation not excluded. 3. Patulous esophagus containing intraluminal debris, suggesting esophageal dysmotility. 4. Aortic Atherosclerosis (ICD10-I70.0). Coronary artery  calcifications. Electronically Signed   By: Jeb Levering M.D.   On: 07/13/2017 02:36   IMPRESSION:  88. 71 yo male with ongoing dysphagia.  He has severe esophageal dysmotility related to CREST and hx of esophageal strictures requiring dilation.  Had UES dilation last month with mild, very short lasting improvement.  2.  Elevated LFT's:  ? Reactive due to acute illness or hepatic congestion from heart failure.  Will follow for now.  3. CHF with EF of 20-25% with acute exacerbation:  On BiPap.  4. 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 He has short term memory loss. Followed at [facility]. Last seizure reportedly in April or May. On Keppra and Trileptal.  5. CREST syndrome  6. Ankylosing spondylitis  7.  Left ventricular apical thrombus and atrial fibrillation:  On eliquis on home.  On heparin gtt here.  PLAN: -I do not know if repeat UES dilation will be extremely beneficial to him.  It could certainly be entertained in several days once he recovers from this acute CHF exacerbation.  His wife is very reasonable and is open to palliative care consult and even possibly feeding tube if need be.  We will check back in on Monday for reassessment.   ZEHR, JESSICA D.  07/13/2017, 9:29 AM  Pager number [phone]   ________________________________________________________________________  [facility] MD note:  I personally examined the patient, reviewed the data and agree with the assessment and plan described above.  He has h/o UES narrowing, severe esophageal body motility disorder.  He cannot eat safely and cannot maintain adequate nutrition.  Also acute on chronic CHF currently tachypnic on a BIPAP.  He is not stable from cardioresp standpoint to undergo any type of endoscopic procedures currently.  His wife is clear that he is DNR, DNI. [place] [facility] will return on Monday.  Given his very frail state and mounting serious comorbidities hospice care, comfort measures seems  very reasonable.     Owens Loffler, MD [facility] Gastroenterology Pager [phone]

## 2017-07-13 NOTE — Progress Notes (Signed)
Patient arrived to unit from the ED. Pt responsive to questions and follows commands. Pt wife states pt has not urinated since arrival, bladder scan noted 250. In and out cath obtained per Dr. Hal Hope. Pt also tachypnea at a rate of 45-56 with intermittent periods of apnea lasting 15-20 seconds at times. He remains on BIPAP, seems to be tolerating it without increased anxiety. Admitting MD made aware of the same, new order for ABG, RT notified of the same. Patient and wife oriented to room and plan of care at this time.

## 2017-07-14 ENCOUNTER — Other Ambulatory Visit: Payer: Self-pay

## 2017-07-14 LAB — GLUCOSE, CAPILLARY: GLUCOSE-CAPILLARY: 104 mg/dL — AB (ref 65–99)

## 2017-07-14 MED ORDER — LORAZEPAM 2 MG/ML IJ SOLN
0.5000 mg | INTRAMUSCULAR | Status: DC | PRN
  Filled 2017-07-14: qty 1

## 2017-07-14 MED ORDER — MORPHINE SULFATE (PF) 4 MG/ML IV SOLN
1.0000 mg | INTRAVENOUS | Status: DC | PRN
  Administered 2017-07-14: 2 mg via INTRAVENOUS
  Filled 2017-07-14: qty 1

## 2017-07-16 ENCOUNTER — Encounter: Payer: PPO | Admitting: Gastroenterology

## 2017-07-19 ENCOUNTER — Ambulatory Visit (HOSPITAL_COMMUNITY): Admission: RE | Admit: 2017-07-19 | Payer: PPO | Source: Ambulatory Visit | Admitting: Gastroenterology

## 2017-07-19 HISTORY — DX: Depression, unspecified: F32.A

## 2017-07-19 HISTORY — DX: Heart failure, unspecified: I50.9

## 2017-07-19 HISTORY — DX: Dyspnea, unspecified: R06.00

## 2017-07-19 HISTORY — DX: Major depressive disorder, single episode, unspecified: F32.9

## 2017-07-19 HISTORY — DX: Anxiety disorder, unspecified: F41.9

## 2017-07-19 HISTORY — DX: Peripheral vascular disease, unspecified: I73.9

## 2017-07-19 SURGERY — ESOPHAGOGASTRODUODENOSCOPY (EGD) WITH PROPOFOL
Anesthesia: Monitor Anesthesia Care

## 2017-07-20 NOTE — Progress Notes (Signed)
Pt desating throughout night on BIPAP sating in the 80's and at times the 70's.  Pt repositioned and mask readjusted which increased O2 sats to at least 93%. RR  30-40s. Around 0330 pt became confused trying to get out of bed stating that he was uncomfortable. Pt appeared to be extremely anxious while trying to take off his mask. Pt sating between 60s-80's.  Pt readjusted and tried to calm down. RT and Rapid Response called.  Pt then started to complain of pain when breathing. Dr. Jeronimo Greaves notified. Orders given for Morphine and Ativan.  Morphine given. Pt then started complaining of feeling nauseous. Zofran given. RT removed BIPAP mask and placed NRB. Pt son at bedside. Pt hyperventilating with shallow breaths. Pt then started agnoal breathing and HR started to decrease. Son and other staff at bedside. Dr. Jeronimo Greaves made aware.

## 2017-07-20 NOTE — Progress Notes (Signed)
Called to bedside reference increased WOB, tachypnea on bipap.  Arrived to find pt breathing 30-40 times/min and very restless/anxious.  Pt's son at bedside.  Pt is a known DNR with recent palliative meeting yesterday 8/25.    Primary RN called Dr. Myna Hidalgo and an order was received for morphine and ativan prn.  2 mg morphine was given.  Initially the pt responded well, then continued to deteriorate with poor compliance on bipap, worsening tachypnea, and nausea.  The pt then became abruptly apneic after what appeared to be generalized seizure-like activity, followed by agonal breathing and subsequent bradycardia and asystole.    Emotional support was provided to the son at bedside.  The chaplain was paged and informed of the events.  3MW RNs at bedside verified absence of pulse, heart tones and breathing.  MD was notified of pt's death.

## 2017-07-20 NOTE — Discharge Summary (Signed)
Physician Discharge Summary  Robert Carey BDZ:[id number] DOB: 11/19/1946 DOA: 06/24/2017  PCP: Asencion Noble, MD  Admit date: 06/23/2017 Discharge date: 07/16/2017  Admitted From: HOME   Patient expired on 08-02-2017 at 4:08 AM  Discharge Diagnoses:  Principal Problem:   Acute respiratory failure with hypoxia ([place])   Active Problems:   ESOPHAGEAL MOTILITY DISORDER   SCLERODERMA   Ankylosing spondylitis (HCC)   Partial epilepsy (HCC)   Seizure disorder (HCC)   Acute on chronic combined systolic and diastolic heart failure (HCC)   Acute renal failure with acute cortical necrosis (HCC)   Palliative care by specialist  Brief Narrative   71 year old male with ischemic cardiomyopathy with EF of 20-25% and left ventricular apical thrombus on recent echo ( on eliquis since early this month),   severe diffuse small vessel disease on recent cardiac cath recommended for aggressive medical management, A. fib on Eliquis, seizure disorder, mild dementia, ankylosing spondylitis on prednisone and hydroxychloroquine esophageal dysmotility with oropharyngeal dysphagia (hospitalization one month back with aspiration pneumonia and required EGD with esophageal dilatation) was brought to the ED by his wife for worsening shortness of breath. Patient was having persistent dyspnea since his hospital discharge one month back but progressed rapidly in the past 48 hours. He also noticed increased lower leg swelling. For past few days he also has been having difficulty swallowing both solid and liquid including his pills. He was scheduled for repeat EGD with dilatation with Dr. Havery Moros on 8/31. Patient denied any chest pain, palpitations, fever, chills, nausea, vomiting, abdominal pain, bowel or urinary symptoms. In the ED patient was in acute hypoxic respiratory failure and placed on BiPAP. Chest x-ray consistent with acute pulmonary edema. Head CT was unremarkable. Patient given 40 mg IV Lasix and admitted to  stepdown unit.   Principal Problem:    Acute respiratory failure with hypoxia (HCC) Acute on chronic systolic CHF Requiring BiPAP and placed on IV Lasix for aggressive diuresis. Cardiology consult appreciated who recommended to continue current management. Recent echo with low EF. All home medications were held due to nothing by mouth status.   Active Problems: Esophageal motility disorder Underwent EGD with dilatation one month back. He had appointment for repeat EGD with dilatation on 8/31. Home medications were switched to IV. GI evaluated patient and given his respiratory status planned to follow-up in the hospital in 2 days.   Scleroderma and ankylosing spondylitis Placed on IV Solu-Medrol.  Elevated troponin Suspect demand ischemia.  Left ventricular apical thrombus Seen on recent 2-D echo. On Eliquis at home which was held due to nothing by mouth status and placed on IV heparin  Transaminitis Suspect hepatic congestion from acute CHF.      Partial epilepsy ([place]) Keppra was switched to IV and started Vimpat to substitute  Trileptal since patient was nothing by mouth.  ? Cheyne-Stokes breathing MRI brain ordered rule out acute intracranial abnormality.  Palliative care team was consulted for goals of care discussion. They met with patient and his wife and had extensive discussion about goals of care. Patient did not appear to be capable of making complex medical decision blood per his wife he wished not to be placed on life support, undergo CPR or aggressive resuscitation if needed. Wife was prepared that his condition was deteriorating and progressively getting worse.  Patient was made DO NOT RESUSCITATE. Further discussion on disposition and goals of care was being undertaken.  Early in the morning on 8/26 patient had increased work of breathing and was  tachypneic on BiPAP. He was very restless. Patient was given IV morphine and Ativan to which he initially  responded but again became tachypneic, nauseous and in respiratory distress. He then became apneic and had a witnessed generalized seizure like activity. He then became bradycardic and went into asystole.  Patient was pronounced dead at 4:08 AM on 2017/07/24. Family notified.   Code Status :DO NOT RESUSCITATE  Family Communication  :  discussed with wife and daughter  Consults  :   Cardiology [facility]  palliative care  Procedures  : Head CT        Allergies  Allergen Reactions  . Dilantin [Phenytoin Sodium Extended] Rash      Procedures/Studies: Dg Chest 2 View  Result Date: 06/26/2017 CLINICAL DATA:  Worsening shortness of breath over the last several days. EXAM: CHEST  2 VIEW COMPARISON:  Radiograph 07/02/2017 FINDINGS: Unchanged cardiomegaly. There is atherosclerosis of the aortic arch. Small to moderate bilateral pleural effusions, increased from prior exam. Vascular congestion with probable Kerley B-lines indicating early pulmonary edema. No pneumothorax. Chronic change about the right shoulder. IMPRESSION: 1. Increased bilateral pleural effusions, vascular congestion or early pulmonary edema consistent with CHF. 2. Stable cardiomegaly and atherosclerosis of the aortic arch. Electronically Signed   By: Jeb Levering M.D.   On: 06/29/2017 21:40   Dg Chest 2 View  Result Date: 07/02/2017 CLINICAL DATA:  Pneumonia. EXAM: CHEST  2 VIEW COMPARISON:  06/08/2017.  06/07/2017. FINDINGS: Mediastinum and hilar structures normal. Cardiomegaly with normal pulmonary vascularity. Interval clearing of previously identified pulmonary edema from prior study of 06/08/2017 and 06/07/2017 . Mild basilar atelectasis and/or scarring. Degenerative changes thoracic spine with stable compression fractures. IMPRESSION: 1. Stable cardiomegaly. Previously identified pulmonary edema on studies of 06/08/2017 and 06/07/2017 has cleared. 2. Mild basilar atelectasis Electronically Signed    By: Marcello Moores  Register   On: 07/02/2017 14:38   Ct Head Wo Contrast  Result Date: 07/03/2017 CLINICAL DATA:  Neuro deficit, subacute.  Fatigue. EXAM: CT HEAD WITHOUT CONTRAST TECHNIQUE: Contiguous axial images were obtained from the base of the skull through the vertex without intravenous contrast. COMPARISON:  Head CT 11/03/2014 FINDINGS: Brain: Stable degree of atrophy and chronic small vessel ischemia. Remote lacunar infarct versus prominent perivascular space in the left basal ganglia. No intracranial hemorrhage, mass effect, or midline shift. No hydrocephalus. The basilar cisterns are patent. No evidence of territorial infarct or acute ischemia. No extra-axial or intracranial fluid collection. Vascular: Atherosclerosis of skullbase vasculature without hyperdense vessel or abnormal calcification. Skull: No skull fracture or focal lesion. Sinuses/Orbits: Paranasal sinuses and mastoid air cells are clear. The visualized orbits are unremarkable. Other: None. IMPRESSION: No acute intracranial abnormality. Stable atrophy and chronic small vessel ischemia. Electronically Signed   By: Jeb Levering M.D.   On: 07/07/2017 23:18   Ct Chest Wo Contrast  Result Date: 07/13/2017 CLINICAL DATA:  Worsening shortness of breath. EXAM: CT CHEST WITHOUT CONTRAST TECHNIQUE: Multidetector CT imaging of the chest was performed following the standard protocol without IV contrast. COMPARISON:  Chest radiographs yesterday demonstrating CHF. FINDINGS: Cardiovascular: Multi chamber cardiomegaly. Coronary artery calcifications. Atherosclerosis of the thoracic aorta. No aortic aneurysm. Possible small pericardial fluid posteriorly measuring 12 mm versus adjacent pleural fluid. Mediastinum/Nodes: Small mediastinal lymph nodes not enlarged by size criteria. Limited assessment for hilar adenopathy given lack contrast, no bulky adenopathy. The upper esophagus is patulous common debris in the mid and distal esophagus. Small low-density  left thyroid nodule does not meet size criteria  for biopsy. Lungs/Pleura: Small to moderate bilateral pleural effusions with fluid tracking in the right major fissure. Adjacent compressive atelectasis in the lower lobes. Pulmonary edema with smooth septal thickening, peribronchial thickening and scattered ground-glass opacities in the perihilar upper lobes. No confluent pneumonia. No pulmonary mass or evidence of suspicious nodule in the aerated lungs. Trachea and mainstem bronchi are patent. Upper Abdomen: Arms down positioning leads to streak artifact. No evidence of acute abnormality. Musculoskeletal: Exaggerated thoracic kyphosis with mild anterior wedging of T6. There are no acute or suspicious osseous abnormalities. Chronic degenerative changes of both shoulders. IMPRESSION: 1. Cardiomegaly with pleural effusions and pulmonary edema consistent with CHF. 2. Bronchial thickening, likely secondary to pulmonary edema, superimposed bronchial inflammation not excluded. 3. Patulous esophagus containing intraluminal debris, suggesting esophageal dysmotility. 4. Aortic Atherosclerosis (ICD10-I70.0). Coronary artery calcifications. Electronically Signed   By: Jeb Levering M.D.   On: 07/13/2017 02:36      Discharge Exam: Vitals:   08-04-17 0008 2017/08/04 0042  BP: (!) 112/91   Pulse: 81 (!) 9  Resp: 17 (!) 35  Temp: 97.7 F (36.5 C)   SpO2: 97% 95%   Vitals:   07/13/17 2043 2017-08-04 0008 2017/08/04 0042 04-Aug-2017 0540  BP: (!) 116/93 (!) 112/91    Pulse: 83 81 (!) 9   Resp: (!) 29 17 (!) 35   Temp: 97.7 F (36.5 C) 97.7 F (36.5 C)    TempSrc: Axillary Axillary    SpO2: 100% 97% 95%   Weight:    68.9 kg (151 lb 14.4 oz)  Height:           The results of significant diagnostics from this hospitalization (including imaging, microbiology, ancillary and laboratory) are listed below for reference.     Microbiology: Recent Results (from the past 240 hour(s))  MRSA PCR Screening     Status:  None   Collection Time: 07/13/17  2:57 AM  Result Value Ref Range Status   MRSA by PCR NEGATIVE NEGATIVE Final    Comment:        The GeneXpert MRSA Assay (FDA approved for NASAL specimens only), is one component of a comprehensive MRSA colonization surveillance program. It is not intended to diagnose MRSA infection nor to guide or monitor treatment for MRSA infections.      Labs: BNP (last 3 results)  Recent Labs  07/16/2017 2057  BNP >1,610.9*   Basic Metabolic Panel:  Recent Labs Lab 07/11/2017 2057 07/13/17 0345  NA 141  --   K 4.4  --   CL 104  --   CO2 19*  --   GLUCOSE 151*  --   BUN 37*  --   CREATININE 1.72*  --   CALCIUM 9.5  --   MG  --  2.2   Liver Function Tests:  Recent Labs Lab 07/11/2017 2057  AST 148*  ALT 121*  ALKPHOS 143*  BILITOT 0.7  PROT 7.8  ALBUMIN 3.6   No results for input(s): LIPASE, AMYLASE in the last 168 hours. No results for input(s): AMMONIA in the last 168 hours. CBC:  Recent Labs Lab 07/16/2017 2057 07/13/17 1129  WBC 9.4 10.3  NEUTROABS 7.1  --   HGB 11.6* 12.5*  HCT 38.3* 39.3  MCV 83.6 82.6  PLT 273 206   Cardiac Enzymes:  Recent Labs Lab 07/13/17 0345  TROPONINI 0.09*   BNP: Invalid input(s): POCBNP CBG:  Recent Labs Lab 07/13/17 0819 07/13/17 1324 07/13/17 2051 07/13/17 2130 August 04, 2017 0012  GLUCAP 84  82 57* 122* 104*   D-Dimer No results for input(s): DDIMER in the last 72 hours. Hgb A1c No results for input(s): HGBA1C in the last 72 hours. Lipid Profile No results for input(s): CHOL, HDL, LDLCALC, TRIG, CHOLHDL, LDLDIRECT in the last 72 hours. Thyroid function studies No results for input(s): TSH, T4TOTAL, T3FREE, THYROIDAB in the last 72 hours.  Invalid input(s): FREET3 Anemia work up No results for input(s): VITAMINB12, FOLATE, FERRITIN, TIBC, IRON, RETICCTPCT in the last 72 hours. Urinalysis No results found for: COLORURINE, APPEARANCEUR, [place], [place], [place], [place],  [place], [place], PROTEINUR, UROBILINOGEN, NITRITE, LEUKOCYTESUR Sepsis Labs Invalid input(s): PROCALCITONIN,  WBC,  LACTICIDVEN Microbiology Recent Results (from the past 240 hour(s))  MRSA PCR Screening     Status: None   Collection Time: 07/13/17  2:57 AM  Result Value Ref Range Status   MRSA by PCR NEGATIVE NEGATIVE Final    Comment:        The GeneXpert MRSA Assay (FDA approved for NASAL specimens only), is one component of a comprehensive MRSA colonization surveillance program. It is not intended to diagnose MRSA infection nor to guide or monitor treatment for MRSA infections.      Time coordinating discharge: < 30 minutes  SIGNED:   Louellen Molder, MD  [facility] 07/16/2017, 2:44 PM Pager   If 7PM-7AM, please contact night-coverage [email or web address] Password [email or web address]

## 2017-07-20 DEATH — deceased

## 2017-07-30 ENCOUNTER — Encounter (HOSPITAL_COMMUNITY): Payer: PPO | Admitting: Cardiology

## 2017-09-02 ENCOUNTER — Encounter: Payer: Self-pay | Admitting: Interventional Radiology
